# Patient Record
Sex: Female | Born: 1985
Health system: Southern US, Community
[De-identification: ages and names within clinical notes are randomized; demographics above are authoritative.]

## PROBLEM LIST (undated history)

## (undated) DIAGNOSIS — U071 COVID-19: Secondary | ICD-10-CM

## (undated) DIAGNOSIS — D72829 Elevated white blood cell count, unspecified: Secondary | ICD-10-CM

## (undated) DIAGNOSIS — J40 Bronchitis, not specified as acute or chronic: Secondary | ICD-10-CM

## (undated) DIAGNOSIS — F99 Mental disorder, not otherwise specified: Secondary | ICD-10-CM

## (undated) DIAGNOSIS — I10 Essential (primary) hypertension: Secondary | ICD-10-CM

## (undated) DIAGNOSIS — A599 Trichomoniasis, unspecified: Secondary | ICD-10-CM

## (undated) DIAGNOSIS — A549 Gonococcal infection, unspecified: Secondary | ICD-10-CM

## (undated) DIAGNOSIS — J45909 Unspecified asthma, uncomplicated: Secondary | ICD-10-CM

## (undated) DIAGNOSIS — L309 Dermatitis, unspecified: Secondary | ICD-10-CM

## (undated) DIAGNOSIS — R87810 Cervical high risk human papillomavirus (HPV) DNA test positive: Secondary | ICD-10-CM

## (undated) HISTORY — DX: Dermatitis, unspecified: L30.9

## (undated) HISTORY — DX: Mental disorder, not otherwise specified: F99

## (undated) HISTORY — DX: Cervical high risk human papillomavirus (HPV) DNA test positive: R87.810

## (undated) HISTORY — PX: NO PAST SURGERIES: SHX2092

---
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
## 2007-04-14 ENCOUNTER — Ambulatory Visit: Payer: Self-pay | Admitting: Internal Medicine

## 2007-05-28 ENCOUNTER — Emergency Department (HOSPITAL_COMMUNITY): Admission: EM | Admit: 2007-05-28 | Discharge: 2007-05-28 | Payer: Self-pay | Admitting: Emergency Medicine

## 2009-09-29 ENCOUNTER — Emergency Department (HOSPITAL_COMMUNITY): Admission: EM | Admit: 2009-09-29 | Discharge: 2009-09-29 | Payer: Self-pay | Admitting: Emergency Medicine

## 2009-10-31 ENCOUNTER — Ambulatory Visit: Payer: Self-pay | Admitting: Internal Medicine

## 2009-11-30 ENCOUNTER — Encounter (INDEPENDENT_AMBULATORY_CARE_PROVIDER_SITE_OTHER): Payer: Self-pay | Admitting: Family Medicine

## 2009-11-30 ENCOUNTER — Ambulatory Visit: Payer: Self-pay | Admitting: Internal Medicine

## 2009-11-30 LAB — CONVERTED CEMR LAB
ALT: 22 units/L (ref 0–35)
AST: 19 units/L (ref 0–37)
Albumin: 3.9 g/dL (ref 3.5–5.2)
Alkaline Phosphatase: 91 units/L (ref 39–117)
BUN: 8 mg/dL (ref 6–23)
Basophils Absolute: 0.1 10*3/uL (ref 0.0–0.1)
Basophils Relative: 0 % (ref 0–1)
CO2: 22 meq/L (ref 19–32)
Calcium: 9.2 mg/dL (ref 8.4–10.5)
Chloride: 105 meq/L (ref 96–112)
Cholesterol: 129 mg/dL (ref 0–200)
Creatinine, Ser: 0.65 mg/dL (ref 0.40–1.20)
Eosinophils Absolute: 0.5 10*3/uL (ref 0.0–0.7)
Eosinophils Relative: 4 % (ref 0–5)
Glucose, Bld: 86 mg/dL (ref 70–99)
HCT: 42 % (ref 36.0–46.0)
HDL: 47 mg/dL (ref >39)
Hemoglobin: 13.2 g/dL (ref 12.0–15.0)
LDL Cholesterol: 69 mg/dL (ref 0–99)
Lymphocytes Relative: 23 % (ref 12–46)
Lymphs Abs: 3.1 10*3/uL (ref 0.7–4.0)
MCHC: 31.4 g/dL (ref 30.0–36.0)
MCV: 88.2 fL (ref 78.0–100.0)
Monocytes Absolute: 1.3 10*3/uL — ABNORMAL HIGH (ref 0.1–1.0)
Monocytes Relative: 10 % (ref 3–12)
Neutro Abs: 8.5 10*3/uL — ABNORMAL HIGH (ref 1.7–7.7)
Neutrophils Relative %: 63 % (ref 43–77)
Platelets: 350 10*3/uL (ref 150–400)
Potassium: 4.2 meq/L (ref 3.5–5.3)
RBC: 4.76 M/uL (ref 3.87–5.11)
RDW: 15.7 % — ABNORMAL HIGH (ref 11.5–15.5)
Sodium: 138 meq/L (ref 135–145)
Total Bilirubin: 0.3 mg/dL (ref 0.3–1.2)
Total CHOL/HDL Ratio: 2.7
Total Protein: 6.8 g/dL (ref 6.0–8.3)
Triglycerides: 63 mg/dL (ref <150)
VLDL: 13 mg/dL (ref 0–40)
WBC: 13.5 10*3/uL — ABNORMAL HIGH (ref 4.0–10.5)

## 2010-03-20 ENCOUNTER — Emergency Department (HOSPITAL_COMMUNITY): Admission: EM | Admit: 2010-03-20 | Discharge: 2010-03-20 | Payer: Self-pay | Admitting: Emergency Medicine

## 2010-04-22 DIAGNOSIS — D72829 Elevated white blood cell count, unspecified: Secondary | ICD-10-CM

## 2010-04-22 HISTORY — DX: Elevated white blood cell count, unspecified: D72.829

## 2010-07-09 LAB — POCT I-STAT, CHEM 8
BUN: 8 mg/dL (ref 6–23)
Calcium, Ion: 1.15 mmol/L (ref 1.12–1.32)
Chloride: 107 mEq/L (ref 96–112)
Creatinine, Ser: 0.7 mg/dL (ref 0.4–1.2)
Glucose, Bld: 97 mg/dL (ref 70–99)
HCT: 45 % (ref 36.0–46.0)
Hemoglobin: 15.3 g/dL — ABNORMAL HIGH (ref 12.0–15.0)
Potassium: 3.6 mEq/L (ref 3.5–5.1)
Sodium: 140 mEq/L (ref 135–145)
TCO2: 25 mmol/L (ref 0–100)

## 2010-07-09 LAB — URINE MICROSCOPIC-ADD ON

## 2010-07-09 LAB — URINALYSIS, ROUTINE W REFLEX MICROSCOPIC
Bilirubin Urine: NEGATIVE
Glucose, UA: NEGATIVE mg/dL
Hgb urine dipstick: NEGATIVE
Ketones, ur: NEGATIVE mg/dL
Nitrite: NEGATIVE
Protein, ur: NEGATIVE mg/dL
Specific Gravity, Urine: 1.022 (ref 1.005–1.030)
Urobilinogen, UA: 0.2 mg/dL (ref 0.0–1.0)
pH: 6.5 (ref 5.0–8.0)

## 2010-07-09 LAB — DIFFERENTIAL
Basophils Absolute: 0 10*3/uL (ref 0.0–0.1)
Basophils Relative: 0 % (ref 0–1)
Eosinophils Absolute: 0.7 10*3/uL (ref 0.0–0.7)
Eosinophils Relative: 4 % (ref 0–5)
Lymphocytes Relative: 11 % — ABNORMAL LOW (ref 12–46)
Lymphs Abs: 2 10*3/uL (ref 0.7–4.0)
Monocytes Absolute: 1.5 10*3/uL — ABNORMAL HIGH (ref 0.1–1.0)
Monocytes Relative: 9 % (ref 3–12)
Neutro Abs: 13.3 10*3/uL — ABNORMAL HIGH (ref 1.7–7.7)
Neutrophils Relative %: 76 % (ref 43–77)

## 2010-07-09 LAB — POCT PREGNANCY, URINE: Preg Test, Ur: NEGATIVE

## 2010-07-09 LAB — CBC
HCT: 41.8 % (ref 36.0–46.0)
Hemoglobin: 13.9 g/dL (ref 12.0–15.0)
MCHC: 33.3 g/dL (ref 30.0–36.0)
MCV: 85.2 fL (ref 78.0–100.0)
Platelets: 314 10*3/uL (ref 150–400)
RBC: 4.91 MIL/uL (ref 3.87–5.11)
RDW: 13.9 % (ref 11.5–15.5)
WBC: 17.5 10*3/uL — ABNORMAL HIGH (ref 4.0–10.5)

## 2010-08-08 ENCOUNTER — Emergency Department (HOSPITAL_COMMUNITY)
Admission: EM | Admit: 2010-08-08 | Discharge: 2010-08-09 | Disposition: A | Discharge status: Home / Self Care | Payer: Self-pay | Attending: Emergency Medicine | Admitting: Emergency Medicine

## 2010-08-08 DIAGNOSIS — S239XXA Sprain of unspecified parts of thorax, initial encounter: Secondary | ICD-10-CM | POA: Insufficient documentation

## 2010-08-08 DIAGNOSIS — Y998 Other external cause status: Secondary | ICD-10-CM | POA: Insufficient documentation

## 2010-08-08 DIAGNOSIS — Y92009 Unspecified place in unspecified non-institutional (private) residence as the place of occurrence of the external cause: Secondary | ICD-10-CM | POA: Insufficient documentation

## 2010-08-08 DIAGNOSIS — Y93E6 Activity, residential relocation: Secondary | ICD-10-CM | POA: Insufficient documentation

## 2010-08-08 DIAGNOSIS — L259 Unspecified contact dermatitis, unspecified cause: Secondary | ICD-10-CM | POA: Insufficient documentation

## 2010-08-08 DIAGNOSIS — S335XXA Sprain of ligaments of lumbar spine, initial encounter: Secondary | ICD-10-CM | POA: Insufficient documentation

## 2010-08-08 DIAGNOSIS — I10 Essential (primary) hypertension: Secondary | ICD-10-CM | POA: Insufficient documentation

## 2010-08-08 DIAGNOSIS — X503XXA Overexertion from repetitive movements, initial encounter: Secondary | ICD-10-CM | POA: Insufficient documentation

## 2010-09-07 NOTE — H&P (Signed)
NAMERIMSHA, TREMBLEY NO.:  [id number]   MEDICAL RECORD NO.:  [id number]           PATIENT TYPE:   LOCATION:                                 FACILITY:   PHYSICIAN:  Isidor Holts, M.D.       DATE OF BIRTH:   DATE OF ADMISSION:  12/04/2004  DATE OF DISCHARGE:                                HISTORY & PHYSICAL   PRIMARY MEDICAL DOCTOR:  [facility]; unassigned to [place].   CHIEF COMPLAINT:  Chest pain.   HISTORY OF PRESENT ILLNESS:  This is a 25 year old female, with known  history of hypertension and seasonal allergies.  She developed central chest  pain with palpitations at about 8:30 p.m. on December 03, 2004.  No radiation,  no shortness of breath, no dizziness or diaphoresis.  The pain lasted until  about 10:30 p.m.  Denies cough.  Denies fever.  Her aunt brought her to the  emergency department, where symptoms were relieved by supplemental oxygen.   PAST MEDICAL HISTORY:  1.  Eczema.  2.  Hypertension.  3.  Seasonal allergies.  4.  Prior history of bronchial asthma.  5.  Obesity.   MEDICATIONS:  1.  Allegra 180 mg p.o. daily.  2.  Nifedipine SR 90 mg p.o. daily.  3.  HCTZ 25 mg p.o. daily.  4.  Recently completed 10-day course of Augmentin, started on December 01, 2004.   ALLERGIES:  No known drug allergies.   SYSTEMS REVIEW:  As per HPI.   CHIEF COMPLAINT:  The patient was recently treated with a 10-day course of  Augmentin for a sinus infection and a leukocytosis.  She is now completely  asymptomatic.  Denies urinary frequency.  Denies dysuria, diarrhea or fever.   FAMILY/SOCIAL HISTORY:  The patient is a Consulting civil engineer.  She is taking her GED at  present, and lives with her aunt.  She is a smoker of 2 cigarettes per day  since age 32 years.  Denies alcohol use or drug abuse.  She has 1 brother  who is alive and well.  Her mother has a seizure disorder and hypertension.  Her father's health status is not known.   PHYSICAL EXAMINATION:  VITAL  SIGNS:  Temperature 97.4, pulse 94 per minute  and regular, respiratory rate 20, blood pressure 103/63 mmHg.  Pulse  oximetry 100% on room air.  GENERAL:  The patient appears quite comfortable, alert, not short of breath  at rest, not in acute distress.  SKIN:  Has atopic eczema on her skin.  HEENT:  No clinical pallor.  No jaundice.  No conjunctival injection.  Throat appears quite clear.  NECK:  Supple.  JVP not seen.  No palpable lymphadenopathy.  No palpable  goiter.  CHEST:  Clear to auscultation.  No wheezes, no crackles.  HEART:  Heart sounds 1 and 2 were heard.  Normal, regular.  No murmurs.  ABDOMEN:  Obese, soft, and nontender.  There is no palpable organomegaly, no  palpable masses.  Normal bowel sounds.  EXTREMITIES:  Lower extremity examination revealed no pitting edema.  Palpable peripheral pulses.  There is no tenseness or tenderness of both  calves.  CENTRAL NERVOUS SYSTEM:  No focal neurological deficit on gross examination.  MUSCULOSKELETAL:  Quite unremarkable.   INVESTIGATIONS:  CBC revealed WBC of 18.2, hemoglobin 11.7, hematocrit 35.3,  platelets 433.  Electrolytes revealed sodium of 137, potassium 3.8, chloride  107, CO2 of 25, BUN 10, creatinine 0.8, glucose 88.  LFT's are normal.  Troponin I at point of care is less than 0.05.  Urine drug screen is  entirely negative.  Chest x-ray shows no acute cardiopulmonary process.  EKG  shows sinus rhythm, regular, 85 per minute.  Normal axis.  No acute ischemic  changes.   ASSESSMENT AND PLAN:  1.  Atypical chest pain, unclear etiology.  Has resolved after supplemental      oxygen in the emergency department.  For completeness, we will do a      chest CT angiogram to rule out pulmonary embolism.  Also, do a 2-D      echocardiogram.  Meanwhile, will start the patient on proton pump      inhibitor treatment.   1.  Leukocytosis.  Query cause. Possibly secondary to allergies or even      secondary to smoking.  There is  no obvious infective focus.  However,      for completeness, will do urinalysis.   1.  Allergies.  This appears clinically quiescent at present.  Will monitor.   1.  Hypertension, controlled.  Continue pre-admission medications.   Further management will depend on clinical course.      Isidor Holts, M.D.  Electronically Signed     CO/MEDQ  D:  12/04/2004  T:  12/04/2004  Job:  [phone]

## 2010-09-21 ENCOUNTER — Emergency Department (HOSPITAL_COMMUNITY)
Admission: EM | Admit: 2010-09-21 | Discharge: 2010-09-22 | Disposition: A | Discharge status: Home / Self Care | Payer: Self-pay | Attending: Emergency Medicine | Admitting: Emergency Medicine

## 2010-09-21 DIAGNOSIS — R109 Unspecified abdominal pain: Secondary | ICD-10-CM | POA: Insufficient documentation

## 2010-09-21 DIAGNOSIS — M545 Low back pain, unspecified: Secondary | ICD-10-CM | POA: Insufficient documentation

## 2010-09-21 DIAGNOSIS — I10 Essential (primary) hypertension: Secondary | ICD-10-CM | POA: Insufficient documentation

## 2010-09-21 LAB — URINALYSIS, ROUTINE W REFLEX MICROSCOPIC
Bilirubin Urine: NEGATIVE
Glucose, UA: NEGATIVE mg/dL
Hgb urine dipstick: NEGATIVE
Ketones, ur: NEGATIVE mg/dL
Nitrite: NEGATIVE
Protein, ur: NEGATIVE mg/dL
Specific Gravity, Urine: >1.03 — ABNORMAL HIGH (ref 1.005–1.030)
Urobilinogen, UA: 0.2 mg/dL (ref 0.0–1.0)
pH: 5.5 (ref 5.0–8.0)

## 2010-09-22 LAB — PREGNANCY, URINE: Preg Test, Ur: NEGATIVE

## 2010-11-04 ENCOUNTER — Emergency Department (HOSPITAL_COMMUNITY)
Admission: EM | Admit: 2010-11-04 | Discharge: 2010-11-04 | Discharge status: Against Medical Advice | Payer: Self-pay | Attending: Emergency Medicine | Admitting: Emergency Medicine

## 2010-11-04 DIAGNOSIS — L02219 Cutaneous abscess of trunk, unspecified: Secondary | ICD-10-CM | POA: Insufficient documentation

## 2010-11-04 DIAGNOSIS — L03319 Cellulitis of trunk, unspecified: Secondary | ICD-10-CM | POA: Insufficient documentation

## 2010-11-04 HISTORY — DX: Essential (primary) hypertension: I10

## 2010-11-04 NOTE — ED Notes (Signed)
Friend busted a whitehead on pt's perineum 2 days ago, tonight pt is having pain to area, friend noted white discharge.

## 2011-01-20 ENCOUNTER — Emergency Department (HOSPITAL_COMMUNITY)
Admission: EM | Admit: 2011-01-20 | Discharge: 2011-01-20 | Disposition: A | Discharge status: Home / Self Care | Payer: Self-pay | Attending: Emergency Medicine | Admitting: Emergency Medicine

## 2011-01-20 DIAGNOSIS — R21 Rash and other nonspecific skin eruption: Secondary | ICD-10-CM | POA: Insufficient documentation

## 2011-01-20 DIAGNOSIS — I10 Essential (primary) hypertension: Secondary | ICD-10-CM | POA: Insufficient documentation

## 2011-01-20 DIAGNOSIS — L259 Unspecified contact dermatitis, unspecified cause: Secondary | ICD-10-CM | POA: Insufficient documentation

## 2011-01-20 DIAGNOSIS — L298 Other pruritus: Secondary | ICD-10-CM | POA: Insufficient documentation

## 2011-01-20 DIAGNOSIS — L2989 Other pruritus: Secondary | ICD-10-CM | POA: Insufficient documentation

## 2011-08-27 ENCOUNTER — Emergency Department (HOSPITAL_COMMUNITY)
Admission: EM | Admit: 2011-08-27 | Discharge: 2011-08-27 | Disposition: A | Discharge status: Home / Self Care | Payer: Self-pay | Attending: Emergency Medicine | Admitting: Emergency Medicine

## 2011-08-27 ENCOUNTER — Encounter (HOSPITAL_COMMUNITY): Payer: Self-pay | Admitting: *Deleted

## 2011-08-27 DIAGNOSIS — R109 Unspecified abdominal pain: Secondary | ICD-10-CM | POA: Insufficient documentation

## 2011-08-27 DIAGNOSIS — F17200 Nicotine dependence, unspecified, uncomplicated: Secondary | ICD-10-CM | POA: Insufficient documentation

## 2011-08-27 DIAGNOSIS — A5901 Trichomonal vulvovaginitis: Secondary | ICD-10-CM | POA: Insufficient documentation

## 2011-08-27 DIAGNOSIS — I10 Essential (primary) hypertension: Secondary | ICD-10-CM | POA: Insufficient documentation

## 2011-08-27 LAB — PREGNANCY, URINE: Preg Test, Ur: NEGATIVE

## 2011-08-27 LAB — URINALYSIS, ROUTINE W REFLEX MICROSCOPIC
Bilirubin Urine: NEGATIVE
Glucose, UA: NEGATIVE mg/dL
Hgb urine dipstick: NEGATIVE
Ketones, ur: NEGATIVE mg/dL
Nitrite: NEGATIVE
Protein, ur: NEGATIVE mg/dL
Specific Gravity, Urine: 1.025 (ref 1.005–1.030)
Urobilinogen, UA: 1 mg/dL (ref 0.0–1.0)
pH: 7 (ref 5.0–8.0)

## 2011-08-27 LAB — URINE MICROSCOPIC-ADD ON

## 2011-08-27 LAB — WET PREP, GENITAL: Yeast Wet Prep HPF POC: NONE SEEN

## 2011-08-27 MED ORDER — METRONIDAZOLE 500 MG PO TABS: 500.0000 mg | ORAL_TABLET | Freq: Two times a day (BID) | ORAL | Status: AC

## 2011-08-27 NOTE — ED Notes (Signed)
The pt has had some vaginal discharge for 2 days.  lmp 4-27

## 2011-08-27 NOTE — ED Notes (Signed)
Reports 5 days of "greenish" vaginal d/c which resolved then subsequently developed thick white vaginal d/c; states is sexually active - has 2 sexual partners and does not use condoms; also does not use any form of birth control; denies urinary symptoms

## 2011-08-27 NOTE — Discharge Instructions (Signed)
Trichomoniasis Trichomoniasis is an infection, caused by the Trichomonas organism, that affects both women and men. In women, the outer female genitalia and the vagina are affected. In men, the penis is mainly affected, but the prostate and other reproductive organs can also be involved. Trichomoniasis is a sexually transmitted disease (STD) and is most often passed to another person through sexual contact. The majority of people who get trichomoniasis do so from a sexual encounter and are also at risk for other STDs. CAUSES   Sexual intercourse with an infected partner.   It can be present in swimming pools or hot tubs.  SYMPTOMS   Abnormal gray-green frothy vaginal discharge in women.   Vaginal itching and irritation in women.   Itching and irritation of the area outside the vagina in women.   Penile discharge with or without pain in males.   Inflammation of the urethra (urethritis), causing painful urination.   Bleeding after sexual intercourse.  RELATED COMPLICATIONS  Pelvic inflammatory disease.   Infection of the uterus (endometritis).   Infertility.   Tubal (ectopic) pregnancy.   It can be associated with other STDs, including gonorrhea and chlamydia, hepatitis B, and HIV.  COMPLICATIONS DURING PREGNANCY  Early (premature) delivery.   Premature rupture of the membranes (PROM).   Low birth weight.  DIAGNOSIS   Visualization of Trichomonas under the microscope from the vagina discharge.   Ph of the vagina greater than 4.5, tested with a test tape.   Trich Rapid Test.   Culture of the organism, but this is not usually needed.   It may be found on a Pap test.   Having a "strawberry cervix,"which means the cervix looks very red like a strawberry.  TREATMENT   You may be given medication to fight the infection. Inform your caregiver if you could be or are pregnant. Some medications used to treat the infection should not be taken during pregnancy.    Over-the-counter medications or creams to decrease itching or irritation may be recommended.   Your sexual partner will need to be treated if infected.  HOME CARE INSTRUCTIONS   Take all medication prescribed by your caregiver.   Take over-the-counter medication for itching or irritation as directed by your caregiver.   Do not have sexual intercourse while you have the infection.   Do not douche or wear tampons.   Discuss your infection with your partner, as your partner may have acquired the infection from you. Or, your partner may have been the person who transmitted the infection to you.   Have your sex partner examined and treated if necessary.   Practice safe, informed, and protected sex.   See your caregiver for other STD testing.  SEEK MEDICAL CARE IF:   You still have symptoms after you finish the medication.   You have an oral temperature above 102 F (38.9 C).   You develop belly (abdominal) pain.   You have pain when you urinate.   You have bleeding after sexual intercourse.   You develop a rash.   The medication makes you sick or makes you throw up (vomit).  Document Released: 10/02/2000 Document Revised: 03/28/2011 Document Reviewed: 10/28/2008 [facility] Patient Information 2012 [place], [place].General Instructions for Vaginal Infections Vaginitis is a term to describe many common vaginal infections. These infections may be due to an imbalance of normal germs (bacteria) that exist in the vagina. Many others are caused by sexually transmitted diseases (STD's). If any medication was prescribed to treat your specific infection, it  is very important that you take the medication as directed. Your caregiver may want to examine and treat your sex partner. CAUSES  The vagina normally contains organisms (bacteria and yeast) in a balance. Certain factors can disturb this balance and cause an infection, such as:  Sexual intercourse.   Nursing.   Pregnancy.    Menopause.   Hormone changes in the body.   Antibiotics.   Infection elsewhere in your body.   Birth control pills or patches.   Douches.   Spermicides.   Medical illnesses (diabetes).  SYMPTOMS  Different types of vaginal infections cause symptoms such as:  Itching.   Pain or burning.   Bad odor.   Pain or bleeding with sexual intercourse.   Redness of the vulva.   Abnormal discharge (yellow, green, heavy white and thick).   Fever.   A sore on the vulva or vagina.   Urinary symptoms (painful or bloody urine).   Pelvic and/or abdominal pain.   Rectal bleeding, discharge or pain.  DIAGNOSIS   Your caregiver will base the diagnosis upon the symptoms that you report.   A complete history of your sex life may be taken   You may have a pelvic exam.   A sample of your vaginal fluid and/or discharge will be examined under the microscope.   Cultures will help complete the exact diagnosis.  TREATMENT  Treatment depends on the cause of your vaginitis. Your treatment may include a medicine that kills germs (antibiotic). The antibiotic may be a shot, a pill, and/or vaginal suppository or cream. It is not uncommon for more than one type of infection to be present. If more than one infection is present, two or more medications may be required. Reoccurrence of vaginal infections may be treated with vaginal suppositories or vaginal cream 2 times a week, or as directed. If your caregiver finds that an STD exists, treatment of your sexual partner(s) is important. This is especially important for those infected with chlamydia, gonorrhea, trichomoniasis, bacterial vaginosis, syphilis and HIV infections. Treating sexual partners will prevent you from being re-infected and help stop the spread of STD infection to others. Although it is best to see a specialist for STD/HIV testing and counseling, this is not always possible. Some states/provinces permit something called "expedited  partner therapy." This kind of program permits you to deliver prescription(s) to a partner without the partner having to seek a formal medical exam.  HOME CARE INSTRUCTIONS   Take all prescribed medication.   If applicable, speak to your partner about recommended treatment.   Do not have sexual intercourse for one week, or as directed by your caregiver.   Practice safe sex.   Use condoms.   Have only one sex partner.   Make sure your sex partner does not have any other sex partners.   Avoid tight pants and panty hose.   Wear cotton underwear.   Do not douche.   Avoid tampons, especially scented ones.   Take warm sitz baths.   Avoid vaginal sprays and perfumed soaps or bath oils.   Apply medicated cream (steroid cream) for itching or irritation with the permission of your caregiver.  SEEK MEDICAL CARE IF:   You have any kind of abnormal vaginal discharge.   Your sex partner has a genital infection.   You have pain or bleeding with sexual intercourse.   You have itching, pain, irritation or bleeding of the vulva.  SEEK IMMEDIATE MEDICAL CARE IF:   You have an  oral temperature above 102 F (38.9 C), not controlled by medicine.   You have belly (abdominal) pain.   Symptoms do not improve within 3 days or as directed.   You develop painful or bloody urine.   You develop rectal pain, bleeding or discharge.  Document Released: 01/16/2005 Document Revised: 03/28/2011 Document Reviewed: 09/01/2008 [facility] Patient Information 2012 [place], [place].

## 2011-08-27 NOTE — ED Provider Notes (Signed)
History  This chart was scribed for Norma Booze, MD by Norma Nelson. This patient was seen in room STRE7/STRE7 and the patient's care was started at 5:20PM.  CSN: [id number]  Arrival date & time 08/27/11  1500   First MD Initiated Contact with Patient 08/27/11 1720      Chief Complaint  Patient presents with  . vaginal discharge     The history is provided by the patient. No language interpreter was used.    Norma Nelson is a 26 y.o. female who presents to the Emergency Department complaining of white vaginal discharge with associated suprapubic abdominal pain that started yesterday. The abdominal pain is described as achy and is worse with touch. She reports a prior episode of vaginal disharge described as a yellowish/greenish color about 5 days ago. She has not taken any medications to improve her symptoms. She is sexually active and states that she uses a condom every time. She denies fever, nausea and emesis as associated symptoms. She has a h/o HTN.   No PCP.   Past Medical History  Diagnosis Date  . Hypertension     History reviewed. No pertinent past surgical history.  No family history on file.  History  Substance Use Topics  . Smoking status: Current Everyday Smoker- one pack a day  . Smokeless tobacco: Not on file  . Alcohol Use: Yes     occ     Review of Systems  Constitutional: Negative for fever and chills.  Respiratory: Negative for cough and shortness of breath.   Gastrointestinal: Positive for abdominal pain. Negative for nausea and vomiting.  Genitourinary: Positive for vaginal discharge. Negative for dysuria.  Neurological: Negative for weakness and headaches.    Allergies  Review of patient's allergies indicates no known allergies.  Home Medications  No current outpatient prescriptions on file.  Triage Vitals: BP 160/85  Pulse 79  Temp(Src) 98.2 F (36.8 C) (Oral)  Resp 18  SpO2 98%  LMP 08/17/2011  Physical Exam  Nursing note and  vitals reviewed. Constitutional: She is oriented to person, place, and time. She appears well-developed and well-nourished. No distress.  HENT:  Head: Normocephalic and atraumatic.  Eyes: Conjunctivae and EOM are normal.  Neck: Neck supple. No tracheal deviation present.  Cardiovascular: Normal rate.   Pulmonary/Chest: Effort normal. No respiratory distress.  Abdominal: Soft. There is tenderness.       Mild tenderness to the suprapubic area  Genitourinary:       Normal external female genitalia. Moderate, watery, white vaginal discharge present. Clear mucus coming from the cervix. Cervix is closed and otherwise normal. Fundus is normal size and position. There no adnexal masses or tenderness. There is no cervical motion tenderness.  Musculoskeletal: Normal range of motion. She exhibits no edema.  Neurological: She is alert and oriented to person, place, and time.  Skin: Skin is warm and dry.  Psychiatric: She has a normal mood and affect. Her behavior is normal.    ED Course  Procedures (including critical care time)  DIAGNOSTIC STUDIES: Oxygen Saturation is 98% on room air, normal by my interpretation.    COORDINATION OF CARE: 5:25PM-Discussed need for pelvic exam and vaginal discharge sample with pt and pt agreed.   Labs Reviewed  URINALYSIS, ROUTINE W REFLEX MICROSCOPIC - Abnormal; Notable for the following:    APPearance HAZY (*)    Leukocytes, UA LARGE (*)    All other components within normal limits  URINE MICROSCOPIC-ADD ON - Abnormal; Notable for the  following:    Squamous Epithelial / LPF FEW (*)    All other components within normal limits  PREGNANCY, URINE   No results found. Results for orders placed during the hospital encounter of 08/27/11  URINALYSIS, ROUTINE W REFLEX MICROSCOPIC      Component Value Range   Color, Urine YELLOW  YELLOW    APPearance HAZY (*) CLEAR    Specific Gravity, Urine 1.025  1.005 - 1.030    pH 7.0  5.0 - 8.0    Glucose, UA NEGATIVE   NEGATIVE (mg/dL)   Hgb urine dipstick NEGATIVE  NEGATIVE    Bilirubin Urine NEGATIVE  NEGATIVE    Ketones, ur NEGATIVE  NEGATIVE (mg/dL)   Protein, ur NEGATIVE  NEGATIVE (mg/dL)   Urobilinogen, UA 1.0  0.0 - 1.0 (mg/dL)   Nitrite NEGATIVE  NEGATIVE    Leukocytes, UA LARGE (*) NEGATIVE   PREGNANCY, URINE      Component Value Range   Preg Test, Ur NEGATIVE  NEGATIVE   URINE MICROSCOPIC-ADD ON      Component Value Range   Squamous Epithelial / LPF FEW (*) RARE    WBC, UA 11-20  <3 (WBC/hpf)   RBC / HPF 0-2  <3 (RBC/hpf)   Bacteria, UA RARE  RARE    Urine-Other TRICHOMONAS PRESENT    WET PREP, GENITAL      Component Value Range   Yeast Wet Prep HPF POC NONE SEEN  NONE SEEN    Trich, Wet Prep MODERATE (*) NONE SEEN    Clue Cells Wet Prep HPF POC FEW (*) NONE SEEN    WBC, Wet Prep HPF POC TOO NUMEROUS TO COUNT (*) NONE SEEN    No results found.    Trichamonasis    MDM  Patient care taken over from Norma Nelson, please see his note for HPI, ROS and PE - return of wet prep shows clue cells and trich, will treat for this and discharge home.    Norma Nelson, [place] 08/27/11 2051

## 2011-08-28 LAB — RPR: RPR Ser Ql: NONREACTIVE

## 2011-08-28 LAB — HIV ANTIBODY (ROUTINE TESTING W REFLEX): HIV: NONREACTIVE

## 2011-08-29 LAB — GC/CHLAMYDIA PROBE AMP, GENITAL
Chlamydia, DNA Probe: NEGATIVE
GC Probe Amp, Genital: NEGATIVE

## 2011-08-29 NOTE — ED Provider Notes (Signed)
Medical screening examination/treatment/procedure(s) were conducted as a shared visit with non-physician practitioner(s) and myself.  I personally evaluated the patient during the encounter   Dione Booze, MD 08/29/11 [phone]

## 2011-09-24 ENCOUNTER — Emergency Department (HOSPITAL_COMMUNITY)
Admission: EM | Admit: 2011-09-24 | Discharge: 2011-09-24 | Disposition: A | Discharge status: Home / Self Care | Payer: Self-pay | Attending: Emergency Medicine | Admitting: Emergency Medicine

## 2011-09-24 ENCOUNTER — Encounter (HOSPITAL_COMMUNITY): Payer: Self-pay | Admitting: *Deleted

## 2011-09-24 DIAGNOSIS — F17200 Nicotine dependence, unspecified, uncomplicated: Secondary | ICD-10-CM | POA: Insufficient documentation

## 2011-09-24 DIAGNOSIS — H6690 Otitis media, unspecified, unspecified ear: Secondary | ICD-10-CM | POA: Insufficient documentation

## 2011-09-24 DIAGNOSIS — H6691 Otitis media, unspecified, right ear: Secondary | ICD-10-CM

## 2011-09-24 DIAGNOSIS — I10 Essential (primary) hypertension: Secondary | ICD-10-CM | POA: Insufficient documentation

## 2011-09-24 MED ORDER — IBUPROFEN 800 MG PO TABS
800.0000 mg | ORAL_TABLET | Freq: Once | ORAL | Status: AC
  Administered 2011-09-24: 800 mg via ORAL
  Filled 2011-09-24: qty 1

## 2011-09-24 MED ORDER — AMOXICILLIN 500 MG PO CAPS: 500.0000 mg | ORAL_CAPSULE | Freq: Three times a day (TID) | ORAL | Status: AC

## 2011-09-24 MED ORDER — AMOXICILLIN 250 MG PO CAPS
500.0000 mg | ORAL_CAPSULE | Freq: Once | ORAL | Status: AC
  Administered 2011-09-24: 500 mg via ORAL
  Filled 2011-09-24: qty 2

## 2011-09-24 MED ORDER — ANTIPYRINE-BENZOCAINE 5.4-1.4 % OT SOLN
3.0000 [drp] | Freq: Once | OTIC | Status: AC
  Administered 2011-09-24: 3 [drp] via OTIC
  Filled 2011-09-24: qty 10

## 2011-09-24 NOTE — ED Provider Notes (Signed)
History     CSN: [id number]  Arrival date & time 09/24/11  1101   None     Chief Complaint  Patient presents with  . Otalgia    (Consider location/radiation/quality/duration/timing/severity/associated sxs/prior treatment) HPI Comments: Has put peroxide and urine in ear without improvement.  Patient is a 26 y.o. female presenting with ear pain. The history is provided by the patient. No language interpreter was used.  Otalgia This is a new problem. The current episode started yesterday. There is pain in the right ear. The problem occurs constantly. The problem has not changed since onset.There has been no fever. The pain is moderate. Associated symptoms include ear discharge and hearing loss. Pertinent negatives include no sore throat. Her past medical history does not include chronic ear infection.    Past Medical History  Diagnosis Date  . Hypertension     History reviewed. No pertinent past surgical history.  No family history on file.  History  Substance Use Topics  . Smoking status: Current Everyday Smoker    Types: Cigarettes  . Smokeless tobacco: Not on file  . Alcohol Use: Yes     occ    OB History    Grav Para Term Preterm Abortions TAB SAB Ect Mult Living                  Review of Systems  Constitutional: Negative for fever and chills.  HENT: Positive for hearing loss, ear pain and ear discharge. Negative for sore throat.     Allergies  Review of patient's allergies indicates no known allergies.  Home Medications   Current Outpatient Rx  Name Route Sig Dispense Refill  . IBUPROFEN 200 MG PO TABS Oral Take 400 mg by mouth every 6 (six) hours as needed. For pain    . AMOXICILLIN 500 MG PO CAPS Oral Take 1 capsule (500 mg total) by mouth 3 (three) times daily. 30 capsule 0    BP 159/97  Pulse 85  Temp(Src) 99.3 F (37.4 C) (Oral)  Resp 20  Ht 5\' 11"  (1.803 m)  Wt 293 lb (132.904 kg)  BMI 40.87 kg/m2  SpO2 100%  LMP 09/20/2011  Physical  Exam  Nursing note and vitals reviewed. Constitutional: She is oriented to person, place, and time. She appears well-developed and well-nourished. No distress.  HENT:  Head: Normocephalic and atraumatic.  Right Ear: External ear normal. No drainage or swelling.  Left Ear: Hearing, tympanic membrane, external ear and ear canal normal.  Nose: Nose normal.  Mouth/Throat: Oropharynx is clear and moist. No oropharyngeal exudate.       Unable to visualize R TM  Eyes: EOM are normal.  Neck: Normal range of motion.  Cardiovascular: Normal rate, regular rhythm and normal heart sounds.   Pulmonary/Chest: Effort normal and breath sounds normal.  Abdominal: Soft. She exhibits no distension. There is no tenderness.  Musculoskeletal: Normal range of motion.  Neurological: She is alert and oriented to person, place, and time.  Skin: Skin is warm and dry.  Psychiatric: She has a normal mood and affect. Judgment normal.    ED Course  Procedures (including critical care time)  Labs Reviewed - No data to display No results found.   1. Right otitis media       MDM  Probable R AOM but unable to visualize R TM. rx-amoxicillin 500 mg TID, 30 OTC ibuprofen 800 mg TID with food. \auragan otic prn pain.        Tressie Ragin Paul Half,  [place] 09/24/11 1213

## 2011-09-24 NOTE — ED Notes (Signed)
C/o bil ear pain since last night.  Reports unable to hear out of right ear.  Denies fever.

## 2011-09-24 NOTE — Discharge Instructions (Signed)
Otitis Media, Adult A middle ear infection is an infection in the space behind the eardrum. The medical name for this is "otitis media." It may happen after a common cold. It is caused by a germ that starts growing in that space. You may feel swollen glands in your neck on the side of the ear infection. HOME CARE INSTRUCTIONS   Take your medicine as directed until it is gone, even if you feel better after the first few days.   Only take over-the-counter or prescription medicines for pain, discomfort, or fever as directed by your caregiver.   Occasional use of a nasal decongestant a couple times per day may help with discomfort and help the eustachian tube to drain better.  Follow up with your caregiver in 10 to 14 days or as directed, to be certain that the infection has cleared. Not keeping the appointment could result in a chronic or permanent injury, pain, hearing loss and disability. If there is any problem keeping the appointment, you must call back to this facility for assistance. SEEK IMMEDIATE MEDICAL CARE IF:   You are not getting better in 2 to 3 days.   You have pain that is not controlled with medication.   You feel worse instead of better.   You cannot use the medication as directed.   You develop swelling, redness or pain around the ear or stiffness in your neck.  MAKE SURE YOU:   Understand these instructions.   Will watch your condition.   Will get help right away if you are not doing well or get worse.  Document Released: 01/12/2004 Document Revised: 03/28/2011 Document Reviewed: 11/13/2007 [facility] Patient Information 2012 [place], [place].   Take the antibiotic as directed.  Take ibuprofen 800 mg every 8 hrs with food.  Insert 2-3 drops of auralgain in ear as needed for pain.  Return as needed.

## 2011-09-24 NOTE — ED Provider Notes (Signed)
Medical screening examination/treatment/procedure(s) were performed by non-physician practitioner and as supervising physician I was immediately available for consultation/collaboration.   Laray Anger, DO 09/24/11 Babette Relic

## 2011-11-05 ENCOUNTER — Emergency Department (HOSPITAL_COMMUNITY)
Admission: EM | Admit: 2011-11-05 | Discharge: 2011-11-05 | Disposition: A | Discharge status: Home / Self Care | Payer: No Typology Code available for payment source | Attending: Emergency Medicine | Admitting: Emergency Medicine

## 2011-11-05 ENCOUNTER — Encounter (HOSPITAL_COMMUNITY): Payer: Self-pay | Admitting: Emergency Medicine

## 2011-11-05 ENCOUNTER — Emergency Department (HOSPITAL_COMMUNITY): Payer: Self-pay

## 2011-11-05 DIAGNOSIS — S39012A Strain of muscle, fascia and tendon of lower back, initial encounter: Secondary | ICD-10-CM

## 2011-11-05 DIAGNOSIS — M545 Low back pain, unspecified: Secondary | ICD-10-CM | POA: Insufficient documentation

## 2011-11-05 DIAGNOSIS — S139XXA Sprain of joints and ligaments of unspecified parts of neck, initial encounter: Secondary | ICD-10-CM | POA: Insufficient documentation

## 2011-11-05 DIAGNOSIS — S335XXA Sprain of ligaments of lumbar spine, initial encounter: Secondary | ICD-10-CM | POA: Insufficient documentation

## 2011-11-05 DIAGNOSIS — I10 Essential (primary) hypertension: Secondary | ICD-10-CM | POA: Insufficient documentation

## 2011-11-05 DIAGNOSIS — S161XXA Strain of muscle, fascia and tendon at neck level, initial encounter: Secondary | ICD-10-CM

## 2011-11-05 DIAGNOSIS — M542 Cervicalgia: Secondary | ICD-10-CM | POA: Insufficient documentation

## 2011-11-05 DIAGNOSIS — F17200 Nicotine dependence, unspecified, uncomplicated: Secondary | ICD-10-CM | POA: Insufficient documentation

## 2011-11-05 DIAGNOSIS — Z79899 Other long term (current) drug therapy: Secondary | ICD-10-CM | POA: Insufficient documentation

## 2011-11-05 LAB — POCT PREGNANCY, URINE: Preg Test, Ur: NEGATIVE

## 2011-11-05 MED ORDER — IBUPROFEN 600 MG PO TABS: 600.0000 mg | ORAL_TABLET | Freq: Three times a day (TID) | ORAL | Status: DC | PRN

## 2011-11-05 MED ORDER — TRAMADOL HCL 50 MG PO TABS: 50.0000 mg | ORAL_TABLET | Freq: Four times a day (QID) | ORAL | Status: AC | PRN

## 2011-11-05 MED ORDER — HYDROCODONE-ACETAMINOPHEN 5-325 MG PO TABS: 1.0000 | ORAL_TABLET | Freq: Once | ORAL | Status: DC

## 2011-11-05 MED ORDER — IBUPROFEN 800 MG PO TABS
800.0000 mg | ORAL_TABLET | Freq: Once | ORAL | Status: AC
  Administered 2011-11-05: 800 mg via ORAL
  Filled 2011-11-05: qty 1

## 2011-11-05 MED ORDER — CYCLOBENZAPRINE HCL 5 MG PO TABS: 5.0000 mg | ORAL_TABLET | Freq: Three times a day (TID) | ORAL | Status: AC | PRN

## 2011-11-05 MED ORDER — CYCLOBENZAPRINE HCL 5 MG PO TABS: 5.0000 mg | ORAL_TABLET | Freq: Three times a day (TID) | ORAL | Status: DC | PRN

## 2011-11-05 NOTE — ED Notes (Signed)
Patient with no complaints at this time. Respirations even and unlabored. Skin warm/dry. Discharge instructions reviewed with patient at this time. Patient given opportunity to voice concerns/ask questions. Patient discharged at this time and left Emergency Department with steady gait.   

## 2011-11-05 NOTE — Discharge Instructions (Signed)
Cervical Sprain A cervical sprain is an injury in the neck in which the ligaments are stretched or torn. The ligaments are the tissues that hold the bones of the neck (vertebrae) in place.Cervical sprains can range from very mild to very severe. Most cervical sprains get better in 1 to 3 weeks, but it depends on the cause and extent of the injury. Severe cervical sprains can cause the neck vertebrae to be unstable. This can lead to damage of the spinal cord and can result in serious nervous system problems. Your caregiver will determine whether your cervical sprain is mild or severe. CAUSES  Severe cervical sprains may be caused by:  Contact sport injuries (football, rugby, wrestling, hockey, auto racing, gymnastics, diving, martial arts, boxing).   Motor vehicle collisions.   Whiplash injuries. This means the neck is forcefully whipped backward and forward.   Falls.  Mild cervical sprains may be caused by:   Awkward positions, such as cradling a telephone between your ear and shoulder.   Sitting in a chair that does not offer proper support.   Working at a poorly designed computer station.   Activities that require looking up or down for long periods of time.  SYMPTOMS   Pain, soreness, stiffness, or a burning sensation in the front, back, or sides of the neck. This discomfort may develop immediately after injury or it may develop slowly and not begin for 24 hours or more after an injury.   Pain or tenderness directly in the middle of the back of the neck.   Shoulder or upper back pain.   Limited ability to move the neck.   Headache.   Dizziness.   Weakness, numbness, or tingling in the hands or arms.   Muscle spasms.   Difficulty swallowing or chewing.   Tenderness and swelling of the neck.  DIAGNOSIS  Most of the time, your caregiver can diagnose this problem by taking your history and doing a physical exam. Your caregiver will ask about any known problems, such as  arthritis in the neck or a previous neck injury. X-rays may be taken to find out if there are any other problems, such as problems with the bones of the neck. However, an X-ray often does not reveal the full extent of a cervical sprain. Other tests such as a computed tomography (CT) scan or magnetic resonance imaging (MRI) may be needed. TREATMENT  Treatment depends on the severity of the cervical sprain. Mild sprains can be treated with rest, keeping the neck in place (immobilization), and pain medicines. Severe cervical sprains need immediate immobilization and an appointment with an orthopedist or neurosurgeon. Several treatment options are available to help with pain, muscle spasms, and other symptoms. Your caregiver may prescribe:  Medicines, such as pain relievers, numbing medicines, or muscle relaxants.   Physical therapy. This can include stretching exercises, strengthening exercises, and posture training. Exercises and improved posture can help stabilize the neck, strengthen muscles, and help stop symptoms from returning.   A neck collar to be worn for short periods of time. Often, these collars are worn for comfort. However, certain collars may be worn to protect the neck and prevent further worsening of a serious cervical sprain.  HOME CARE INSTRUCTIONS   Put ice on the injured area.   Put ice in a plastic bag.   Place a towel between your skin and the bag.   Leave the ice on for 15 to 20 minutes, 3 to 4 times a day.     Only take over-the-counter or prescription medicines for pain, discomfort, or fever as directed by your caregiver.   Keep all follow-up appointments as directed by your caregiver.   Keep all physical therapy appointments as directed by your caregiver.   If a neck collar is prescribed, wear it as directed by your caregiver.   Do not drive while wearing a neck collar.   Make any needed adjustments to your work station to promote good posture.   Avoid positions  and activities that make your symptoms worse.   Warm up and stretch before being active to help prevent problems.  SEEK MEDICAL CARE IF:   Your pain is not controlled with medicine.   You are unable to decrease your pain medicine over time as planned.   Your activity level is not improving as expected.  SEEK IMMEDIATE MEDICAL CARE IF:   You develop any bleeding, stomach upset, or signs of an allergic reaction to your medicine.   Your symptoms get worse.   You develop new, unexplained symptoms.   You have numbness, tingling, weakness, or paralysis in any part of your body.  MAKE SURE YOU:   Understand these instructions.   Will watch your condition.   Will get help right away if you are not doing well or get worse.  Document Released: 02/03/2007 Document Revised: 03/28/2011 Document Reviewed: 01/09/2011 Exit[employer] Patient Information 2012 [employer].  RESOURCE GUIDE  Chronic Pain Problems: Contact [facility]  [phone] Patients need to be referred by their primary care doctor.  Insufficient Money for Medicine: Contact United Way:  call "211" or [facility] 271-5999.  No Primary Care Doctor: - Call [facility]  [phone] - can help you locate a primary care doctor that  accepts your insurance, provides certain services, etc. - Physician Referral Service- 1-800-533-3463  Agencies that provide inexpensive medical care: - [facility]  [phone] - [facility]  [phone] - [facility]  [phone] - [facility]  [phone] - [facility]  [phone] - [facility]  [phone]  Medicaid-accepting [place] Providers: - [facility]- [address]  [phone], Mon-Fri 9am-7pm, Sat 9am-1pm - [facility]- [address], [address]  [phone] - [facility]- [address], [address]  [phone] - [facility]- [address] [place] [address]  [phone] - Veita Bland- [address], [phone]  Only accepts [place] Access Medicaid patients after they have their name  applied to their card  Self Pay (no insurance) in [place]: - Sickle Cell Patients: Dr Eric Dean, [facility]  [address], [phone] - [facility]- [address]  [phone]       -     [facility]- [address]       -     [facility]- see information above (Speak to Pam H if you do not have insurance)       -  [facility]- [address], [phone]       -  [facility] [place]- [address],  [phone]       -  [facility]- [address] [place] [address], [phone]       -  Dr Osei-Bonsu-  [address], [place], [phone]       -    [facility]- [address], [phone]       -  [facility]- [address] [place] [address], [phone], also [address], [phone]       -    [facility]- [address], [phone], 1st & 3rd Saturday   every month, 10am-1pm  1) Find a Doctor and Pay Out of Pocket Although you won't have to find out who is covered by your insurance plan, it is a good idea to ask around and get recommendations. You will then need to call the office and see if the doctor you have chosen will accept you as a new patient and what types of options they offer for patients who are self-pay. Some doctors offer discounts or will set up payment plans for their patients who do not have insurance, but you will need to ask so you aren't surprised when you get to your appointment.  2) Contact Your Local Health Department Not all health departments have doctors that can see patients for sick visits, but many do, so it is worth a call to see if yours does. If you don't know where your local health department is, you can check in  your phone book. The CDC also has a tool to help you locate your state's health department, and many state websites also have listings of all of their local health departments.  3) Find a Walk-in Clinic If your illness is not likely to be very severe or complicated, you may want to try a walk in clinic. These are popping up all over the country in pharmacies, drugstores, and shopping centers. They're usually staffed by nurse practitioners or physician assistants that have been trained to treat common illnesses and complaints. They're usually fairly quick and inexpensive. However, if you have serious medical issues or chronic medical problems, these are probably not your best option  STD Testing - [place] [facility] [place], [facility], [address], [place], phone [phone] or 1-877-539-9860.  Monday - Friday, call for an appointment. - [place] [facility] [place], [facility], [address], [place], phone [phone] or 1-877-539-9860.  Monday - Friday, call for an appointment.  Abuse/Neglect: - [place] Child Abuse Hotline (336) 641-3795 - [place] Child Abuse Hotline 800-378-5315 (After Hours)  Emergency Shelter:  [facility] (336) 271-5985  Maternity Homes: - [facility] (336) 275-9566 - [facility] (704) 372-4663  MRSA Hotline #:   832-7006  [place] Resources  [facility] [place]  [employer] [place] [facility] [address]                 [address]         [address]  [place]                                               [place]                              [place] Phone:  [phone]                                    Phone:  [phone]                   Phone:  [phone]  [place] [facility], [phone] - [place] Services - [facility]- 1-888-581-9988       -     [facility] in [place], [address],                                  336-349-4454, Insurance  [place] Child Abuse Hotline (336) 342-1394 or (336) 342-3537 (After Hours)   Behavioral Health Services  Substance Abuse Resources: - [facility]  336-882-2125 - [facility] 336-784-9470 - [facility] 336-285-9073 - [facility] 336-845-3988 - Residential & Outpatient Substance Abuse Program  800-659-3381  Psychological Services: - [facility]  [phone] - [facility]  [phone] - [facility], [address], [place], ACCESS LINE: 1-800-853-5163 or 336-641-4981, Http://www.[facility].com/services/adult.htm  Dental Assistance  If unable to pay or uninsured, contact:  [facility] or [place] [facility] to become qualified for the adult dental clinic.  Patients with Medicaid: [facility] [address], [phone], [phone]  If unable to pay, or uninsured, contact [facility] ([phone]) or [place] [facility] ([phone] in [place], [phone] in [place]) to become qualified for the adult dental clinic  Other Low-Cost Community Dental Services: - [facility]- [address], [place], [place], [address], [phone], [address], 2nd and 4th Thursday of the month at 6:30am.  10 clients each day by appointment, can sometimes see walk-in patients if someone does not show for an appointment. - [facility]- [address], [place], [place], [address], [phone] - [facility]- [address], [place], [place], [address], [phone] - [place] [facility]- [phone] - [facility]- [phone] - [facility]- [phone]     
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
## 2011-11-07 NOTE — ED Provider Notes (Signed)
Medical screening examination/treatment/procedure(s) were performed by non-physician practitioner and as supervising physician I was immediately available for consultation/collaboration. Devoria Albe, MD, FACEP   Ward Givens, MD 11/07/11 [phone]

## 2012-03-28 ENCOUNTER — Encounter (HOSPITAL_COMMUNITY): Payer: Self-pay | Admitting: Emergency Medicine

## 2012-03-28 ENCOUNTER — Emergency Department (HOSPITAL_COMMUNITY)
Admission: EM | Admit: 2012-03-28 | Discharge: 2012-03-28 | Disposition: A | Discharge status: Home / Self Care | Payer: Self-pay | Attending: Emergency Medicine | Admitting: Emergency Medicine

## 2012-03-28 DIAGNOSIS — F17200 Nicotine dependence, unspecified, uncomplicated: Secondary | ICD-10-CM | POA: Insufficient documentation

## 2012-03-28 DIAGNOSIS — N61 Mastitis without abscess: Secondary | ICD-10-CM | POA: Insufficient documentation

## 2012-03-28 DIAGNOSIS — I10 Essential (primary) hypertension: Secondary | ICD-10-CM | POA: Insufficient documentation

## 2012-03-28 DIAGNOSIS — N611 Abscess of the breast and nipple: Secondary | ICD-10-CM

## 2012-03-28 MED ORDER — DOXYCYCLINE HYCLATE 100 MG PO CAPS: 100.0000 mg | ORAL_CAPSULE | Freq: Two times a day (BID) | ORAL | Status: DC

## 2012-03-28 MED ORDER — DOXYCYCLINE HYCLATE 100 MG PO TABS
100.0000 mg | ORAL_TABLET | Freq: Once | ORAL | Status: AC
  Administered 2012-03-28: 100 mg via ORAL
  Filled 2012-03-28: qty 1

## 2012-03-28 NOTE — Discharge Instructions (Signed)
Heat Therapy Heat therapy can help ease achy, tense, stiff, and tight muscles and joints. Heat should not be used on new injuries. Wait at least 48 hours after the injury before using heat therapy. Heat also should not be used for discomfort or pain that occurs right after doing an activity. If you still have pain or stiffness 3 hours after finishing the activity, then heat therapy may be used. PRECAUTIONS  High heat or prolonged exposure to heat can cause burns. Be careful when using heat therapy to avoid burning your skin. If you have any of the following conditions, do not use heat until you have discussed heat therapy with your caregiver:  Poor circulation.  Healing wounds or scarred skin in the area being treated.  Diabetes, heart disease, or high blood pressure.  Numbness of the area being treated.  Unusual swelling of the area being treated.  Active infections.  Blood clots.  Cancer.  Inability to communicate your response to pain. This can include young children and people with dementia. HOME CARE INSTRUCTIONS Moist heat pack  Soak a clean towel in warm water, and squeeze out the extra water. The water temperature should be comfortable to the skin.  Put the warm, wet towel in a plastic bag.  Place a thin, dry towel between your skin and the bag.  Put the heat pack on the area for 5 minutes, and check your skin. Your skin may be pink, but it should not be red.  Leave the heat pack on the area for a total of 15 to 30 minutes.  Repeat this every 2 to 4 hours while awake. Do not use heat while you are sleeping. Warm water bath  Fill a tub with warm water. The water temperature should be comfortable to the skin.  Place the affected body part in the tub.  Soak the area for 20 to 40 minutes.  Repeat as needed. Hot water bottle  Fill the water bottle half full with hot water.  Press out the extra air. Close the cap tightly.  Place a dry towel between your skin and  the bottle.  Put the bottle on the area for 5 minutes, and check your skin. Your skin may be pink, but it should not be red.  Leave the bottle on the area for a total of 15 to 30 minutes.  Repeat this every 2 to 4 hours while awake. Electric heating pad  Place a dry towel between your skin and the heating pad.  Set the heating pad on low heat.  Put the heating pad on the area for 10 minutes, and check your skin. Your skin may be pink, but it should not be red.  Leave the heating pad on the area for a total of 20 to 40 minutes.  Repeat this every 2 to 4 hours while awake.  Do not lie on the heating pad.  Do not fall asleep while using the heating pad.  Do not use the heating pad near water. Contact with water can result in an electrical shock. SEEK MEDICAL CARE IF:  You have blisters, redness, swelling, or numbness.  You have any new problems.  Your problems are getting worse.  You have any questions or concerns. If you develop any problems, stop using heat therapy until you see your caregiver. MAKE SURE YOU:  Understand these instructions.  Will watch your condition.  Will get help right away if you are not doing well or get worse. Document Released: 07/01/2011  Document Reviewed: 07/01/2011 [facility] Patient Information 2013 [place], [place]. Abscess An abscess is an infected area that contains a collection of pus and debris.It can occur in almost any part of the body. An abscess is also known as a furuncle or boil. CAUSES  An abscess occurs when tissue gets infected. This can occur from blockage of oil or sweat glands, infection of hair follicles, or a minor injury to the skin. As the body tries to fight the infection, pus collects in the area and creates pressure under the skin. This pressure causes pain. People with weakened immune systems have difficulty fighting infections and get certain abscesses more often.  SYMPTOMS Usually an abscess develops on the skin and  becomes a painful mass that is red, warm, and tender. If the abscess forms under the skin, you may feel a moveable soft area under the skin. Some abscesses break open (rupture) on their own, but most will continue to get worse without care. The infection can spread deeper into the body and eventually into the bloodstream, causing you to feel ill.  DIAGNOSIS  Your caregiver will take your medical history and perform a physical exam. A sample of fluid may also be taken from the abscess to determine what is causing your infection. TREATMENT  Your caregiver may prescribe antibiotic medicines to fight the infection. However, taking antibiotics alone usually does not cure an abscess. Your caregiver may need to make a small cut (incision) in the abscess to drain the pus. In some cases, gauze is packed into the abscess to reduce pain and to continue draining the area. HOME CARE INSTRUCTIONS   Only take over-the-counter or prescription medicines for pain, discomfort, or fever as directed by your caregiver.  If you were prescribed antibiotics, take them as directed. Finish them even if you start to feel better.  If gauze is used, follow your caregiver's directions for changing the gauze.  To avoid spreading the infection:  Keep your draining abscess covered with a bandage.  Wash your hands well.  Do not share personal care items, towels, or whirlpools with others.  Avoid skin contact with others.  Keep your skin and clothes clean around the abscess.  Keep all follow-up appointments as directed by your caregiver. SEEK MEDICAL CARE IF:   You have increased pain, swelling, redness, fluid drainage, or bleeding.  You have muscle aches, chills, or a general ill feeling.  You have a fever. MAKE SURE YOU:   Understand these instructions.  Will watch your condition.  Will get help right away if you are not doing well or get worse. Document Released: 01/16/2005 Document Revised: 10/08/2011  Document Reviewed: 06/21/2011 [facility] Patient Information 2013 [place], [place].    Take the antibiotic as directed.  Apply warm compresses several times daily.  Return to the ED if your symptoms worsen or change significantly.

## 2012-03-28 NOTE — ED Notes (Signed)
Witness in room with Neville Route, PA during exam of pt's right breast

## 2012-03-28 NOTE — ED Notes (Signed)
Patient with c/o right breast pain. Abscess noted. Redness.

## 2012-03-28 NOTE — ED Provider Notes (Signed)
Medical screening examination/treatment/procedure(s) were performed by non-physician practitioner and as supervising physician I was immediately available for consultation/collaboration.   Charles B. Bernette Mayers, MD 03/28/12 1352

## 2012-03-28 NOTE — ED Notes (Signed)
Pt with redness to right medial breast, tender to touch, denies fever or N/V

## 2012-03-28 NOTE — ED Provider Notes (Signed)
History     CSN: [id number]  Arrival date & time 03/28/12  1052   First MD Initiated Contact with Patient 03/28/12 1130      Chief Complaint  Patient presents with  . Breast Pain  . Abscess    (Consider location/radiation/quality/duration/timing/severity/associated sxs/prior treatment) HPI Comments: States she noticed a small pimple like lesion on her R breast yest.  Today she says it is swollen and painful.  No  PCP   Patient is a 26 y.o. female presenting with abscess. The history is provided by the patient. No language interpreter was used.  Abscess  This is a new problem. The current episode started yesterday. The problem has been gradually worsening. The problem is mild. The abscess is characterized by painfulness. The abscess first occurred at home. Pertinent negatives include no fever. There were no sick contacts. She has received no recent medical care.    Past Medical History  Diagnosis Date  . Hypertension     History reviewed. No pertinent past surgical history.  No family history on file.  History  Substance Use Topics  . Smoking status: Current Every Day Smoker    Types: Cigarettes  . Smokeless tobacco: Not on file  . Alcohol Use: Yes     Comment: occ    OB History    Grav Para Term Preterm Abortions TAB SAB Ect Mult Living                  Review of Systems  Constitutional: Negative for fever and chills.  Skin:       Abscess   All other systems reviewed and are negative.    Allergies  Hydrocodone; Naproxen; and Tramadol  Home Medications   Current Outpatient Rx  Name  Route  Sig  Dispense  Refill  . DOXYCYCLINE HYCLATE 100 MG PO CAPS   Oral   Take 1 capsule (100 mg total) by mouth 2 (two) times daily.   20 capsule   0     BP 153/106  Pulse 60  Temp 97.7 F (36.5 C) (Oral)  Resp 17  Ht 5\' 9"  (1.753 m)  Wt 289 lb (131.09 kg)  BMI 42.68 kg/m2  SpO2 98%  Physical Exam  Nursing note and vitals reviewed. Constitutional: She  is oriented to person, place, and time. She appears well-developed and well-nourished. No distress.  HENT:  Head: Normocephalic and atraumatic.  Eyes: EOM are normal.  Neck: Normal range of motion.  Cardiovascular: Normal rate, regular rhythm and normal heart sounds.   Pulmonary/Chest: Effort normal and breath sounds normal.    Abdominal: Soft. She exhibits no distension. There is no tenderness.  Musculoskeletal: Normal range of motion.  Neurological: She is alert and oriented to person, place, and time.  Skin: Skin is warm and dry.  Psychiatric: She has a normal mood and affect. Judgment normal.    ED Course  Procedures (including critical care time)  Labs Reviewed - No data to display No results found.   1. Abscess of right breast       MDM  rx-doxycycline, 20 Warm compresses Return to ED prn        Evalina Field, PA 03/28/12 1150

## 2012-05-01 ENCOUNTER — Emergency Department (HOSPITAL_COMMUNITY)
Admission: EM | Admit: 2012-05-01 | Discharge: 2012-05-01 | Disposition: A | Discharge status: Home / Self Care | Payer: Self-pay | Attending: Emergency Medicine | Admitting: Emergency Medicine

## 2012-05-01 ENCOUNTER — Emergency Department (HOSPITAL_COMMUNITY): Payer: Self-pay

## 2012-05-01 DIAGNOSIS — R6883 Chills (without fever): Secondary | ICD-10-CM | POA: Insufficient documentation

## 2012-05-01 DIAGNOSIS — R6889 Other general symptoms and signs: Secondary | ICD-10-CM | POA: Insufficient documentation

## 2012-05-01 DIAGNOSIS — I10 Essential (primary) hypertension: Secondary | ICD-10-CM | POA: Insufficient documentation

## 2012-05-01 DIAGNOSIS — F17200 Nicotine dependence, unspecified, uncomplicated: Secondary | ICD-10-CM | POA: Insufficient documentation

## 2012-05-01 DIAGNOSIS — B349 Viral infection, unspecified: Secondary | ICD-10-CM

## 2012-05-01 DIAGNOSIS — J029 Acute pharyngitis, unspecified: Secondary | ICD-10-CM | POA: Insufficient documentation

## 2012-05-01 DIAGNOSIS — R110 Nausea: Secondary | ICD-10-CM | POA: Insufficient documentation

## 2012-05-01 DIAGNOSIS — IMO0001 Reserved for inherently not codable concepts without codable children: Secondary | ICD-10-CM | POA: Insufficient documentation

## 2012-05-01 DIAGNOSIS — J3489 Other specified disorders of nose and nasal sinuses: Secondary | ICD-10-CM | POA: Insufficient documentation

## 2012-05-01 DIAGNOSIS — B9789 Other viral agents as the cause of diseases classified elsewhere: Secondary | ICD-10-CM | POA: Insufficient documentation

## 2012-05-01 MED ORDER — ALBUTEROL SULFATE (5 MG/ML) 0.5% IN NEBU
2.5000 mg | INHALATION_SOLUTION | Freq: Once | RESPIRATORY_TRACT | Status: AC
  Administered 2012-05-01: 2.5 mg via RESPIRATORY_TRACT
  Filled 2012-05-01: qty 0.5

## 2012-05-01 MED ORDER — OXYCODONE-ACETAMINOPHEN 5-325 MG PO TABS
2.0000 | ORAL_TABLET | Freq: Once | ORAL | Status: AC
  Administered 2012-05-01: 2 via ORAL
  Filled 2012-05-01: qty 2

## 2012-05-01 MED ORDER — ACETAMINOPHEN 325 MG PO TABS
650.0000 mg | ORAL_TABLET | Freq: Once | ORAL | Status: AC
  Administered 2012-05-01: 650 mg via ORAL

## 2012-05-01 MED ORDER — ACETAMINOPHEN 325 MG PO TABS
ORAL_TABLET | ORAL | Status: AC
  Filled 2012-05-01: qty 2

## 2012-05-01 MED ORDER — ONDANSETRON 4 MG PO TBDP
4.0000 mg | ORAL_TABLET | Freq: Once | ORAL | Status: AC
  Administered 2012-05-01: 4 mg via ORAL
  Filled 2012-05-01: qty 1

## 2012-05-01 NOTE — ED Provider Notes (Signed)
History     CSN: [id number]  Arrival date & time 05/01/12  0307   First MD Initiated Contact with Patient 05/01/12 [phone]      No chief complaint on file.   (Consider location/radiation/quality/duration/timing/severity/associated sxs/prior treatment) HPI JOSETTA WIGAL is a 27 y.o. female who presents to the Emergency Department complaining of cough, body aches, nausea, chills, x 2 days. She has take cough syrup without relief.  Past Medical History  Diagnosis Date  . Hypertension     No past surgical history on file.  No family history on file.  History  Substance Use Topics  . Smoking status: Current Every Day Smoker    Types: Cigarettes  . Smokeless tobacco: Not on file  . Alcohol Use: Yes     Comment: occ    OB History    Grav Para Term Preterm Abortions TAB SAB Ect Mult Living                  Review of Systems  Constitutional: Negative for fever.       10 Systems reviewed and are negative for acute change except as noted in the HPI.  HENT: Positive for congestion, sore throat and sneezing.   Eyes: Negative for discharge and redness.  Respiratory: Positive for cough. Negative for shortness of breath.   Cardiovascular: Negative for chest pain.  Gastrointestinal: Negative for vomiting and abdominal pain.  Musculoskeletal: Positive for myalgias. Negative for back pain.  Skin: Negative for rash.  Neurological: Negative for syncope, numbness and headaches.  Psychiatric/Behavioral:       No behavior change.    Allergies  Hydrocodone; Naproxen; and Tramadol  Home Medications   Current Outpatient Rx  Name  Route  Sig  Dispense  Refill  . DOXYCYCLINE HYCLATE 100 MG PO CAPS   Oral   Take 1 capsule (100 mg total) by mouth 2 (two) times daily.   20 capsule   0     BP 157/84  Pulse 80  Temp 100.9 F (38.3 C) (Oral)  Resp 18  Ht 6' (1.829 m)  Wt 300 lb (136.079 kg)  BMI 40.69 kg/m2  SpO2 95%  LMP 04/15/2012  Physical Exam  Nursing note and  vitals reviewed. Constitutional: She appears well-developed and well-nourished.       Awake, alert, nontoxic appearance.  HENT:  Head: Atraumatic.  Right Ear: External ear normal.  Left Ear: External ear normal.  Nose: Nose normal.  Mouth/Throat: Oropharynx is clear and moist.  Eyes: Conjunctivae normal and EOM are normal. Pupils are equal, round, and reactive to light. Right eye exhibits no discharge. Left eye exhibits no discharge.  Neck: Neck supple.  Cardiovascular: Normal heart sounds.   Pulmonary/Chest: Effort normal. She has wheezes. She exhibits no tenderness.       Coarse cough  Abdominal: Soft. There is no tenderness. There is no rebound.  Musculoskeletal: She exhibits no tenderness.       Baseline ROM, no obvious new focal weakness.  Neurological:       Mental status and motor strength appears baseline for patient and situation.  Skin: No rash noted.  Psychiatric: She has a normal mood and affect.    ED Course  Procedures (including critical care time)  Dg Chest 2 View  05/01/2012  *RADIOLOGY REPORT*  Clinical Data: Cough, fever and congestion.  CHEST - 2 VIEW  Comparison: PA and lateral chest 03/20/2010 and 06/06/2005.  Findings: Lungs are clear.  Heart size is normal.  No  pneumothorax or pleural fluid.  IMPRESSION: No acute disease.   Original Report Authenticated By: Holley Dexter, M.D.        MDM  Patient with cough, body aches, sore throat, nausea x 2 days. PE with wheezing on exam. Given albuterol nebulizer treatment with relief of wheezing. Xray without acute findings. Ibuprofen and analgesic improved the aches and pains. Reviewed results with the patient.  Pt feels improved after observation and/or treatment in ED.Pt stable in ED with no significant deterioration in condition.The patient appears reasonably screened and/or stabilized for discharge and I doubt any other medical condition or other [facility] requiring further screening, evaluation, or treatment in the ED  at this time prior to discharge.  MDM Reviewed: nursing note and vitals Interpretation: x-ray           Nicoletta Dress. Colon Branch, MD 05/01/12 [phone]

## 2012-05-01 NOTE — Discharge Instructions (Signed)
Your xray did NOT show a bronchitis or pneumonia. Drink lots of fluids. Use tylenol or ibuprofen for aches, pains and any fevers. Continue to use the cough medicine as needed.

## 2012-05-01 NOTE — ED Notes (Signed)
Pt reports flu like symptoms for 2 days.  Cough, body aches, possible fever.  Reports mild nausea, no vomiting.  Pt reports she just started taking cough syrup, has had one dose, no relief.

## 2012-05-01 NOTE — ED Notes (Signed)
Pt alert & oriented x4, stable gait. Patient given discharge instructions, paperwork & prescription(s). Patient  instructed to stop at the registration desk to finish any additional paperwork. Patient verbalized understanding. Pt left department w/ no further questions. 

## 2012-05-01 NOTE — ED Notes (Signed)
Respiratory paged for a breathing treatment at this time.  

## 2012-05-04 ENCOUNTER — Encounter (HOSPITAL_COMMUNITY): Payer: Self-pay | Admitting: *Deleted

## 2012-05-04 ENCOUNTER — Emergency Department (HOSPITAL_COMMUNITY)
Admission: EM | Admit: 2012-05-04 | Discharge: 2012-05-04 | Disposition: A | Discharge status: Home / Self Care | Payer: Self-pay | Attending: Emergency Medicine | Admitting: Emergency Medicine

## 2012-05-04 DIAGNOSIS — R509 Fever, unspecified: Secondary | ICD-10-CM | POA: Insufficient documentation

## 2012-05-04 DIAGNOSIS — R059 Cough, unspecified: Secondary | ICD-10-CM | POA: Insufficient documentation

## 2012-05-04 DIAGNOSIS — IMO0001 Reserved for inherently not codable concepts without codable children: Secondary | ICD-10-CM | POA: Insufficient documentation

## 2012-05-04 DIAGNOSIS — R110 Nausea: Secondary | ICD-10-CM | POA: Insufficient documentation

## 2012-05-04 DIAGNOSIS — J029 Acute pharyngitis, unspecified: Secondary | ICD-10-CM | POA: Insufficient documentation

## 2012-05-04 DIAGNOSIS — R0602 Shortness of breath: Secondary | ICD-10-CM | POA: Insufficient documentation

## 2012-05-04 DIAGNOSIS — F17200 Nicotine dependence, unspecified, uncomplicated: Secondary | ICD-10-CM | POA: Insufficient documentation

## 2012-05-04 DIAGNOSIS — R05 Cough: Secondary | ICD-10-CM | POA: Insufficient documentation

## 2012-05-04 DIAGNOSIS — I10 Essential (primary) hypertension: Secondary | ICD-10-CM | POA: Insufficient documentation

## 2012-05-04 DIAGNOSIS — J111 Influenza due to unidentified influenza virus with other respiratory manifestations: Secondary | ICD-10-CM

## 2012-05-04 MED ORDER — PROMETHAZINE HCL 25 MG PO TABS: 25.0000 mg | ORAL_TABLET | Freq: Four times a day (QID) | ORAL | Status: DC | PRN

## 2012-05-04 MED ORDER — KETOROLAC TROMETHAMINE 60 MG/2ML IM SOLN
60.0000 mg | Freq: Once | INTRAMUSCULAR | Status: AC
  Administered 2012-05-04: 60 mg via INTRAMUSCULAR
  Filled 2012-05-04: qty 2

## 2012-05-04 MED ORDER — ONDANSETRON 8 MG PO TBDP
8.0000 mg | ORAL_TABLET | Freq: Once | ORAL | Status: AC
  Administered 2012-05-04: 8 mg via ORAL
  Filled 2012-05-04: qty 1

## 2012-05-04 MED ORDER — TRAMADOL HCL 50 MG PO TABS: 50.0000 mg | ORAL_TABLET | Freq: Four times a day (QID) | ORAL | Status: DC | PRN

## 2012-05-04 MED ORDER — OXYCODONE-ACETAMINOPHEN 5-325 MG PO TABS: 1.0000 | ORAL_TABLET | ORAL | Status: DC | PRN

## 2012-05-04 NOTE — Discharge Instructions (Signed)
Take medicines as prescribed - return to ER for severe or worsening symptoms.  Read the FLU instructions attached.  You will feel bad for 7-10 days.  Tylenol or Ibuprofen for fever.  RESOURCE GUIDE  Chronic Pain Problems: Contact [facility]  [phone] Patients need to be referred by their primary care doctor.  Insufficient Money for Medicine: Contact United Way:  call "211."   No Primary Care Doctor: - Call Health Connect  [phone] - can help you locate a primary care doctor that  accepts your insurance, provides certain services, etc. - Physician Referral Service- [phone]  Agencies that provide inexpensive medical care: - [facility]  [phone] - [facility]  [phone] - [facility]  [phone] - Women's Clinic  [phone] - Planned Parenthood  [phone] - [facility]  [phone]  Medicaid-accepting [facility] Providers: - [facility]- [address]  [phone], Mon-Fri 9am-7pm, Sat 9am-1pm - [facility]- [address] [place], [address] [place]  [phone] - [facility]- [address], [address] [place]  [phone] [facility]- [address] [place] [address]  [phone] - Renaye Rakers- [address] [place], [address], [phone]  Only accepts [place] patients after they have their name  applied to their card  Self Pay (no insurance) in [place]: - Sickle Cell Patients: Dr Willey Blade, [facility]  [address] [place], [phone] - [facility]- [address] [place]  [phone]       [facility] [place]- [address]       -     [facility]- see information above (Speak to [employer] if you do not have insurance)       -  [facility]- [address],  [phone]       -  [facility]- [address] [phone]       -  Dr Julio Sicks-   [address], [place], [phone]       -  [facility] - [address], [phone]       -  [facility]- [address], [phone], also [address], [phone]       -    [facility]- [address] [place], [phone], 1st & 3rd Saturday        every month, 10am-1pm  1) Find a Doctor and Pay Out of Pocket Although you won't have to find out who is covered by your insurance plan, it is a good idea to ask around and get recommendations. You will then need to call the office and see if the doctor you have chosen will accept you as a new patient and what types of options they offer for patients who are self-pay. Some doctors offer discounts or will set up payment plans for their patients who do not have insurance, but you will need to ask so you aren't surprised when you get to your appointment.  2) Contact Your Local Health Department Not all health departments have doctors that can see patients for sick visits, but many do, so it is worth a call to see if yours does. If you don't know where your local health department is, you can check in your phone book.  The CDC also has a tool to help you locate your state's health department, and many state websites also have listings of all of their local health departments.  3) Find a Walk-in Clinic If your illness is not likely to be very severe or complicated, you may want to try a walk in clinic. These are popping up all over the country in pharmacies, drugstores, and shopping centers. They're usually staffed by nurse practitioners or physician assistants that have been trained to treat common illnesses and complaints. They're usually fairly quick and inexpensive. However, if you have serious medical issues or chronic medical problems, these are probably not your best option  STD Testing - [facility] Department of [facility], STD Clinic, [address] [place],  phone [phone] or [phone].  Monday - Friday, call for an appointment. [facility] Department of [employer], STD Clinic, [address], [place], phone [phone] or [phone].  Monday - Friday, call for an appointment.  Abuse/Neglect: [facility] Child Abuse Hotline 332-783-1537 [facility] Child Abuse Hotline (509)002-1742 (After Hours)  Emergency Shelter:  [facility] (647)151-1791  Maternity Homes: - Room at the [place] [facility] (304) [phone] - [facility] [phone]  MRSA Hotline #:   718-376-4713  [facility] Resources [facility] [place]  United [employer] [facility] Dept. [address]                 [address] [place] [address]         [address] [place] [address]  Blondell Reveal Phone:  [phone]                                  Phone:  [phone]                   Phone:  [phone]  [facility], [phone] - [facility]- [phone]       -     [facility] in [place], [address],             [phone], Insurance  [place] [place] Child Abuse Hotline 978 225 3078 or 386-477-9277 (After Hours)  Dental Assistance  If unable to pay or uninsured, contact:  [facility]. to become qualified for the adult dental clinic.  Patients with Medicaid: [facility] [phone] [address], [phone] [address] [phone]  If unable to pay, or uninsured, contact [facility] [phone] in [place], [phone] in [facility]) to become qualified for the adult dental clinic  Other Low-Cost Community Dental Services: - [facility]- [address] [place], [place], [place], [address], [phone], Ext. 123, 2nd and 4th Thursday of the month at 6:30am.  10  clients each day by appointment, can sometimes see walk-in patients if someone does not show for an appointment. -  [facility]- [address] [place], [place], [address], [phone] - [facility] [address], [place], [place], [address], [phone] - [facility]- [phone] [facility] Health Department- [phone] [facility] [place] [phone]       Behavioral Health Resources in the [facility]  Intensive Outpatient Programs: [facility]      [address] [place], [place] [phone] Both a day and evening program       [facility] Outpatient     [address]        [place], [place] [address] [phone]         ADS: [facility] S[facility] [address] [place] [phone]  [facility] Mental Health ACCESS LINE: (573) [phone] or [phone] [address] [place], [place] [address] [email or web address]  Behavioral Health Services  Substance Abuse Resources: - Alcohol and Drug Services  2243519054 - [facility] (445)504-1278 - [facility] [place] [phone] Floydene Flock (236)012-8334 - Residential & Outpatient Substance Abuse Program  [phone]  Psychological Services: [facility]  [phone] [facility] Services  [phone] - [facility], [phone] [place]. [address], [place], ACCESS LINE: 2151341919 or (919) 331-0337, [email or web address]  Mobile Crisis Teams:                                        Therapeutic Alternatives         Mobile Crisis Care Unit (848)034-7135             [facility] [address] Ginette Otto [phone]                                         Interventionist [address], [address] [place] [phone]  Self-Help/Support Groups: Mental Health  Assoc. of [employer] of support groups (478)884-0883 (call for more info)   Narcotics Anonymous (NA) Caring Services [address] [place] - 2 meetings at this location  Residential Treatment Programs:  [facility]      [phone]        [place]       [phone]         [facility] [address] [place] [address] [place], [place]  [address] [phone]  [facility]  [address] [place], [place] [address] [phone] Admissions: 8am-3pm M-F  [facility]     [address]        [place], [place] [address]       [phone]         [facility] [address] [place], [place] [phone]  [facility] [address] [place], [place] [phone]  [facility] - Intensive Outpatient      [address]     [place], [place]       [phone]         [facility] (Add[facility]     [address] [place], [place] [phone] or [phone]  Residential Treatment Services (RTS), Medicaid [address] [place], [place] [phone]  [facility]  [address] [place] [phone]  [facility] Resources: [place]- [phone]               General Therapy                                                Angie Fava, PhD        [address] [place], [place] [address]         [phone]   Insurance  [facility] Behavioral   [address] [place], [place] [address] [phone]  [facility] [address] [place], [place] [address] [phone] Insurance/Medicaid/sponsorship through [facility]                                              [address]                                        [place], [place] [address]    Therapy/tele-psych/case                  [phone]          [facility] [address], [place]  [address]  Adolescent/group home/case management [phone]                                           Creola Corn PhD       General therapy       Insurance   [phone]         Dr. Lolly Mustache, Insurance, M-F [phone]

## 2012-05-04 NOTE — ED Provider Notes (Addendum)
History     CSN: [id number]  Arrival date & time 05/04/12  4098   First MD Initiated Contact with Patient 05/04/12 0631      Chief Complaint  Patient presents with  . Generalized Body Aches  . Shortness of Breath  . Nausea    (Consider location/radiation/quality/duration/timing/severity/associated sxs/prior treatment) HPI Comments: Pt presents with c/o fevers, chills, sore throat and a cough.  Has been going on for several days, constant, better with tylenol, persistent sx otherwise.  Has associated myalgias.  She was seen in ED and dx with viral synddrome 3 days ago but sx persist.  Tolerating PO  Patient is a 27 y.o. female presenting with shortness of breath. The history is provided by the patient.  Shortness of Breath  Associated symptoms include shortness of breath.    Past Medical History  Diagnosis Date  . Hypertension     History reviewed. No pertinent past surgical history.  History reviewed. No pertinent family history.  History  Substance Use Topics  . Smoking status: Current Every Day Smoker    Types: Cigarettes  . Smokeless tobacco: Not on file  . Alcohol Use: Yes     Comment: occ    OB History    Grav Para Term Preterm Abortions TAB SAB Ect Mult Living                  Review of Systems  Respiratory: Positive for shortness of breath.   All other systems reviewed and are negative.    Allergies  Hydrocodone; Naproxen; and Tramadol  Home Medications   Current Outpatient Rx  Name  Route  Sig  Dispense  Refill  . DOXYCYCLINE HYCLATE 100 MG PO CAPS   Oral   Take 1 capsule (100 mg total) by mouth 2 (two) times daily.   20 capsule   0   . OXYCODONE-ACETAMINOPHEN 5-325 MG PO TABS   Oral   Take 1 tablet by mouth every 4 (four) hours as needed for pain.   10 tablet   0   . PROMETHAZINE HCL 25 MG PO TABS   Oral   Take 1 tablet (25 mg total) by mouth every 6 (six) hours as needed for nausea.   12 tablet   0   . TRAMADOL HCL 50 MG PO  TABS   Oral   Take 1 tablet (50 mg total) by mouth every 6 (six) hours as needed for pain.   15 tablet   0     BP 157/77  Pulse 96  Temp 101.5 F (38.6 C) (Oral)  Resp 20  Ht 6' (1.829 m)  Wt 300 lb (136.079 kg)  BMI 40.69 kg/m2  SpO2 99%  LMP 04/15/2012  Physical Exam  Nursing note and vitals reviewed. Constitutional: She appears well-developed and well-nourished. No distress.  HENT:  Head: Normocephalic and atraumatic.  Mouth/Throat: No oropharyngeal exudate.       Pharynx erythematous with no exudate, hypertrophy or assymetry.  Eyes: Conjunctivae normal and EOM are normal. Pupils are equal, round, and reactive to light. Right eye exhibits no discharge. Left eye exhibits no discharge. No scleral icterus.  Neck: Normal range of motion. Neck supple. No JVD present. No thyromegaly present.  Cardiovascular: Normal rate, regular rhythm, normal heart sounds and intact distal pulses.  Exam reveals no gallop and no friction rub.   No murmur heard. Pulmonary/Chest: Effort normal and breath sounds normal. No respiratory distress. She has no wheezes. She has no rales.  Abdominal: Soft.  Bowel sounds are normal. She exhibits no distension and no mass. There is no tenderness.  Musculoskeletal: Normal range of motion. She exhibits no edema and no tenderness.  Lymphadenopathy:    She has no cervical adenopathy.  Neurological: She is alert. Coordination normal.  Skin: Skin is warm and dry. No rash noted. No erythema.  Psychiatric: She has a normal mood and affect. Her behavior is normal.    ED Course  Procedures (including critical care time)  Labs Reviewed - No data to display No results found.   1. Flu       MDM  Normal lung sounds, normal heart rate, fever.  She has a flu like illness with fever, and URI sx, but no signs of pna.  Pt given pain meds, and Rx for pain meds - will f/u as outpatient as needed.  Had CXR in ED 3 nights ago which was normal.        Vida Roller, MD 05/04/12 1610  Vida Roller, MD 05/04/12 [phone]

## 2012-05-04 NOTE — ED Notes (Signed)
Pt c/o body aches sob and chills. Pt states she was seen and treated in ed and has had no improvement.

## 2012-08-28 ENCOUNTER — Encounter (HOSPITAL_COMMUNITY): Payer: Self-pay | Admitting: *Deleted

## 2012-08-28 ENCOUNTER — Emergency Department (HOSPITAL_COMMUNITY)
Admission: EM | Admit: 2012-08-28 | Discharge: 2012-08-28 | Disposition: A | Discharge status: Home / Self Care | Payer: Self-pay | Attending: Emergency Medicine | Admitting: Emergency Medicine

## 2012-08-28 DIAGNOSIS — L02419 Cutaneous abscess of limb, unspecified: Secondary | ICD-10-CM | POA: Insufficient documentation

## 2012-08-28 DIAGNOSIS — L02416 Cutaneous abscess of left lower limb: Secondary | ICD-10-CM

## 2012-08-28 DIAGNOSIS — F17200 Nicotine dependence, unspecified, uncomplicated: Secondary | ICD-10-CM | POA: Insufficient documentation

## 2012-08-28 DIAGNOSIS — I10 Essential (primary) hypertension: Secondary | ICD-10-CM | POA: Insufficient documentation

## 2012-08-28 LAB — PREGNANCY, URINE: Preg Test, Ur: NEGATIVE

## 2012-08-28 MED ORDER — LIDOCAINE-EPINEPHRINE 2 %-1:100000 IJ SOLN: 10.0000 mL | Freq: Once | INTRAMUSCULAR | Status: DC

## 2012-08-28 MED ORDER — OXYCODONE-ACETAMINOPHEN 5-325 MG PO TABS: 1.0000 | ORAL_TABLET | ORAL | Status: DC | PRN

## 2012-08-28 MED ORDER — LIDOCAINE-EPINEPHRINE (PF) 2 %-1:200000 IJ SOLN
INTRAMUSCULAR | Status: AC
  Filled 2012-08-28: qty 20

## 2012-08-28 MED ORDER — IBUPROFEN 400 MG PO TABS
600.0000 mg | ORAL_TABLET | Freq: Once | ORAL | Status: AC
  Administered 2012-08-28: 600 mg via ORAL
  Filled 2012-08-28: qty 2

## 2012-08-28 MED ORDER — OXYCODONE-ACETAMINOPHEN 5-325 MG PO TABS
2.0000 | ORAL_TABLET | Freq: Once | ORAL | Status: AC
  Administered 2012-08-28: 2 via ORAL
  Filled 2012-08-28: qty 2

## 2012-08-28 MED ORDER — LIDOCAINE HCL (PF) 2 % IJ SOLN
INTRAMUSCULAR | Status: AC
  Filled 2012-08-28: qty 10

## 2012-08-28 NOTE — ED Notes (Addendum)
Abscess to labia for 4-5 days, vaginal bleeding.lower abd pain

## 2012-08-28 NOTE — ED Notes (Signed)
Patient with no complaints at this time. Respirations even and unlabored. Skin warm/dry. Discharge instructions reviewed with patient at this time. Patient given opportunity to voice concerns/ask questions. Patient discharged at this time and left Emergency Department with steady gait.   

## 2012-08-28 NOTE — Discharge Instructions (Signed)
Abscess An abscess is an infected area that contains a collection of pus and debris.It can occur in almost any part of the body. An abscess is also known as a furuncle or boil. CAUSES  An abscess occurs when tissue gets infected. This can occur from blockage of oil or sweat glands, infection of hair follicles, or a minor injury to the skin. As the body tries to fight the infection, pus collects in the area and creates pressure under the skin. This pressure causes pain. People with weakened immune systems have difficulty fighting infections and get certain abscesses more often.  SYMPTOMS Usually an abscess develops on the skin and becomes a painful mass that is red, warm, and tender. If the abscess forms under the skin, you may feel a moveable soft area under the skin. Some abscesses break open (rupture) on their own, but most will continue to get worse without care. The infection can spread deeper into the body and eventually into the bloodstream, causing you to feel ill.  DIAGNOSIS  Your caregiver will take your medical history and perform a physical exam. A sample of fluid may also be taken from the abscess to determine what is causing your infection. TREATMENT  Your caregiver may prescribe antibiotic medicines to fight the infection. However, taking antibiotics alone usually does not cure an abscess. Your caregiver may need to make a small cut (incision) in the abscess to drain the pus. In some cases, gauze is packed into the abscess to reduce pain and to continue draining the area. HOME CARE INSTRUCTIONS   Only take over-the-counter or prescription medicines for pain, discomfort, or fever as directed by your caregiver.  If you were prescribed antibiotics, take them as directed. Finish them even if you start to feel better.  If gauze is used, follow your caregiver's directions for changing the gauze.  To avoid spreading the infection:  Keep your draining abscess covered with a  bandage.  Wash your hands well.  Do not share personal care items, towels, or whirlpools with others.  Avoid skin contact with others.  Keep your skin and clothes clean around the abscess.  Keep all follow-up appointments as directed by your caregiver. SEEK MEDICAL CARE IF:   You have increased pain, swelling, redness, fluid drainage, or bleeding.  You have muscle aches, chills, or a general ill feeling.  You have a fever. MAKE SURE YOU:   Understand these instructions.  Will watch your condition.  Will get help right away if you are not doing well or get worse. Document Released: 01/16/2005 Document Revised: 10/08/2011 Document Reviewed: 06/21/2011 [facility] Patient Information 2013 [place], [place].  RESOURCE GUIDE  Chronic Pain Problems: Contact [facility]  [phone] Patients need to be referred by their primary care doctor.  Insufficient Money for Medicine: Contact United Way:  call "211."   No Primary Care Doctor: - Call Health Connect  [phone] - can help you locate a primary care doctor that  accepts your insurance, provides certain services, etc. - Physician Referral [phone]  Agencies that provide inexpensive medical care: - [facility]  [phone] - [facility]  [phone] - [facility]  [phone] - Women's Clinic  [phone] - Planned Parenthood  [phone] - [facility]  [phone]  Medicaid-accepting [facility] Providers: - [facility]- [address]  [phone], Mon-Fri 9am-7pm, Sat 9am-1pm - [facility]- [address] [place], [address] [place]  [phone] -  [facility]- [address] [place]  [phone] [facility]- [address]  [phone] - Renaye Rakers- [address] [place], [address], [phone]  Only accepts [place] patients after they have their name  applied  to their card  Self Pay (no insurance) in [facility]: - Sickle Cell Patients: Dr Willey Blade, [facility]  [address] [place], [phone] - [facility]- [address] [employer]  [phone]       [facility] [place]- [address]       -     [facility]- see information above (Speak to [employer] if you do not have insurance)       -  [facility]- [address],  [phone]       -  [facility]- [address], [phone]       -  Dr Julio Sicks-  [address], [place], [phone]       -  Urgent Medical and [facility] - [address] [phone]       -  [facility]- [address] [phone], also [address] [phone]       -    [facility]- [address] [place], [phone], 1st & 3rd Saturday        every month, 10am-1pm  [facility] [address] [place], [place] [address] [phone]  [facility] [address] [place], [place] [address] [phone]  1) Find a Doctor and Pay Out of Pocket Although you won't have to find out who is covered by your insurance plan, it is a good idea to ask around and get recommendations. You will then need to call the office and see if the doctor you have chosen will accept you as a new patient and what types of options they offer for patients who are self-pay. Some doctors offer discounts or will set up payment plans for their patients who do not have insurance, but you will need to ask so you aren't surprised when you get to your appointment.  2) Contact Your Local Health Department Not all health departments have doctors that can see patients for sick visits, but many do, so it is worth a call to see if yours does. If you don't know where your local health department is, you can check in your phone book. The CDC also has a tool to help you locate your state's  health department, and many state websites also have listings of all of their local health departments.  3) Find a Walk-in Clinic If your illness is not likely to be very severe or complicated, you may want to try a walk in clinic. These are popping up all over the country in pharmacies, drugstores, and shopping centers. They're usually staffed by nurse practitioners or physician assistants that have been trained to treat common illnesses and complaints. They're usually fairly quick and inexpensive. However, if you have serious medical issues or chronic medical problems, these are probably not your best option  STD Testing - [facility] Department of [facility], STD Clinic, [address] [place], phone [phone] or [phone].  Monday - Friday, call for an appointment. [facility] Department of [employer], STD Clinic, [address], [place], phone [phone] or [phone].  Monday - Friday, call for an appointment.  Abuse/Neglect: [facility] Child Abuse Hotline 820-120-9137 [facility] Child Abuse Hotline (251)327-5235 (After Hours)  Emergency Shelter:  [facility] 437-422-3331  Maternity Homes: - [facility] ([phone])[phone] - [facility] [phone]  MRSA Hotline #:   276-115-5364  Dental Assistance If unable to pay or uninsured, contact:  [facility] to become qualified for the adult dental clinic.  Patients with Medicaid: [facility] [phone], [phone] [address], [phone]  If unable to pay, or uninsured, contact [facility] [phone] in [place], [phone] in [facility]) to become qualified for the adult dental clinic  [facility] [address] [place], [place] [address] [phone] [email or web address]  Other Proofreader Services: - [facility]-  [address] [place], [place], [place], [address], [phone], Ext. [phone], 2nd and 4th Thursday of the month at 6:30am.  10 clients each day by appointment, can sometimes see walk-in patients if someone does not show for an appointment. [facility]- [address] [place], [place], [address], [phone] - [facility] [address], [place], [place], [address], [phone] - [facility]- [phone] [facility] Health Department- [phone] [facility] Health [phone]       Behavioral Health Resources in the [facility]  Intensive Outpatient Programs: [facility]      [address] [place], [place] [phone] Both a day and evening program       [facility] Out[facility]     [address]        [place], [place] [address] [phone]         ADS: [facility] [address] [place] [phone]  [facility] Mental Health ACCESS LINE: [phone]) [phone] or [phone] [address] [place], [place] [address] [email or web address]   Substance Abuse Resources: - [facility] Drug [facility]  [phone] - [facility] [phone] - [facility] [phone] Floydene Flock [phone] - Residential & Outpatient Substance Abuse Program  [phone]  Psychological Services: [facility]  [phone] [facility] Services  [phone] - [facility], [phone] [place]. [address] [place], ACCESS LINE: 812-267-0094 or [phone], [email or web address]  Mobile Crisis Teams:                                        Therapeutic Alternatives         Mobile Crisis Care Unit [phone]             [facility] [address] Ginette Otto [phone]                                         Interventionist [address] [place] [address] [place] [phone]  Self-Help/Support Groups: Mental Health Assoc. of [employer] of support groups [phone] (call for more info)   Narcotics Anonymous (NA) Caring Services [address] [place] - 2 meetings at this location  Residential [employer]:  [facility] Residential Treatment      [phone] [employer]  [place]       [phone]         [facility] [address] [place] [address] [place], [place]  [address] [phone]  [facility]  [address] [place], [place] [address] [phone] Admissions: 8am-3pm M-F  [facility]     [address]        [place], [place] [address]       [phone]         [facility] [address] [place], [place] [phone]  [facility] [address] [place], [place] [phone]  [facility] - Intensive Outpatient      [address]     [place], [place]       [phone]         [facility] ([facility]     [address] [place], [place] [phone] or [phone]  Residential Treatment Services (RTS), Medicaid [address] [place], [place] [phone]  [facility] [address]                                               [address] [place] [phone]  [facility] Resources: [phone]               General Therapy                                                Angie Fava, PhD        [address] [place], [place] [address]         [phone]   Insurance  [facility]   [address] [place], [place] [address] [phone]  [facility] [address] [place], [place] [address] [phone] Insurance/Medicaid/sponsorship through [facility] and Families                                              [address]                                         [place], [place] [address]    Therapy/tele-psych/case         [phone]          [facility] [address], [place]  [address]  Adolescent/group home/case management [phone]                                           Creola Corn PhD       General therapy       Insurance   [phone]  Dr. Lolly Mustache, Insurance, M-F [phone]  [facility]  United [employer] [facility] Dept. [address]                 [address]         [address] [place] [address]  Blondell Reveal Phone:  [phone]                                  Phone:  [phone]                   Phone:  [phone]  [facility] Mental Health, [phone] - [facility] - [facility]- [phone]       -     [facility] in [place], [address],             [phone], Insurance  [place] Child Abuse Hotline 623-445-4463 or [phone] (After Hours)

## 2012-08-28 NOTE — ED Notes (Addendum)
Patient states she has "boil" on perineum at inside of L thigh.  Has put H2O2 + cottonball on it overnight.  Some bleeding noted.  She also states she has had 2 menstrual cycles w/in a month and is experiencing pelvic pain at present.  Provided peripads for her.

## 2012-09-02 NOTE — ED Provider Notes (Signed)
History    27 year old female with a "boil" to her left side. She first noticed this about 4 days ago. Progressively getting larger in size and more painful. Some spontaneous drainage. No fevers or chills. No urinary complaints. Patient has tried putting hydrogen peroxide on it.  CSN: [id number]  Arrival date & time 08/28/12  1442   First MD Initiated Contact with Patient 08/28/12 1606      Chief Complaint  Patient presents with  . Abscess    (Consider location/radiation/quality/duration/timing/severity/associated sxs/prior treatment) HPI  Past Medical History  Diagnosis Date  . Hypertension     History reviewed. No pertinent past surgical history.  History reviewed. No pertinent family history.  History  Substance Use Topics  . Smoking status: Current Every Day Smoker    Types: Cigarettes  . Smokeless tobacco: Not on file  . Alcohol Use: Yes     Comment: occ    OB History   Grav Para Term Preterm Abortions TAB SAB Ect Mult Living                  Review of Systems  All systems reviewed and negative, other than as noted in HPI.   Allergies  Hydrocodone; Naproxen; and Tramadol  Home Medications   Current Outpatient Rx  Name  Route  Sig  Dispense  Refill  . oxyCODONE-acetaminophen (PERCOCET/ROXICET) 5-325 MG per tablet   Oral   Take 1 tablet by mouth every 4 (four) hours as needed for pain.   8 tablet   0     BP 148/85  Pulse 95  Temp(Src) 98.9 F (37.2 C) (Oral)  Resp 20  Ht 5\' 11"  (1.803 m)  Wt 300 lb (136.079 kg)  BMI 41.86 kg/m2  SpO2 100%  LMP 08/14/2012  Physical Exam  Nursing note and vitals reviewed. Constitutional: She appears well-developed and well-nourished. No distress.  HENT:  Head: Normocephalic and atraumatic.  Eyes: Conjunctivae are normal. Right eye exhibits no discharge. Left eye exhibits no discharge.  Neck: Neck supple.  Cardiovascular: Normal rate, regular rhythm and normal heart sounds.  Exam reveals no gallop and no  friction rub.   No murmur heard. Pulmonary/Chest: Effort normal and breath sounds normal. No respiratory distress.  Abdominal: Soft. She exhibits no distension. There is no tenderness.  Musculoskeletal: She exhibits no edema and no tenderness.  Neurological: She is alert.  Skin: Skin is warm and dry.  Tender fluctuant lesion to proximal medial L thigh consistent with abscess. 3cm.   Psychiatric: She has a normal mood and affect. Her behavior is normal. Thought content normal.    ED Course  Procedures (including critical care time)  INCISION AND DRAINAGE Performed by: Raeford Razor Consent: Verbal consent obtained. Risks and benefits: risks, benefits and alternatives were discussed Type: abscess  Body area: L medial proximal thigh  Anesthesia: local infiltration  Incision was made with a scalpel.  Local anesthetic: lidocaine 2 % w epinephrine  Anesthetic total: 3  ml  Complexity: complex Blunt dissection to break up loculations  Drainage: purulent  Drainage amount: small  Patient tolerance: Patient tolerated the procedure well with no immediate complications.    Labs Reviewed  PREGNANCY, URINE   No results found.   1. Abscess of left thigh       MDM  27 year old female with an abscess the left thigh. This was incised and drained. No surrounding cellulitis. Plan continued wound care and sitz baths. Return precautions discussed. When necessary pain medication. We'll 27 year old female with  a "boil" to her left side.       Raeford Razor, MD 09/02/12 2329

## 2013-01-08 ENCOUNTER — Encounter (HOSPITAL_COMMUNITY): Payer: Self-pay | Admitting: *Deleted

## 2013-01-08 ENCOUNTER — Emergency Department (HOSPITAL_COMMUNITY)
Admission: EM | Admit: 2013-01-08 | Discharge: 2013-01-09 | Disposition: A | Discharge status: Home / Self Care | Payer: Self-pay | Attending: Emergency Medicine | Admitting: Emergency Medicine

## 2013-01-08 DIAGNOSIS — I10 Essential (primary) hypertension: Secondary | ICD-10-CM | POA: Insufficient documentation

## 2013-01-08 DIAGNOSIS — F17200 Nicotine dependence, unspecified, uncomplicated: Secondary | ICD-10-CM | POA: Insufficient documentation

## 2013-01-08 DIAGNOSIS — J029 Acute pharyngitis, unspecified: Secondary | ICD-10-CM | POA: Insufficient documentation

## 2013-01-08 DIAGNOSIS — J209 Acute bronchitis, unspecified: Secondary | ICD-10-CM | POA: Insufficient documentation

## 2013-01-08 DIAGNOSIS — J40 Bronchitis, not specified as acute or chronic: Secondary | ICD-10-CM

## 2013-01-08 DIAGNOSIS — IMO0001 Reserved for inherently not codable concepts without codable children: Secondary | ICD-10-CM | POA: Insufficient documentation

## 2013-01-08 HISTORY — DX: Bronchitis, not specified as acute or chronic: J40

## 2013-01-08 MED ORDER — ACETAMINOPHEN 500 MG PO TABS
ORAL_TABLET | ORAL | Status: AC
  Administered 2013-01-08: 1000 mg
  Filled 2013-01-08: qty 2

## 2013-01-08 NOTE — ED Notes (Signed)
Sore throat, fever, chills, nasal congestion

## 2013-01-09 ENCOUNTER — Encounter (HOSPITAL_COMMUNITY): Payer: Self-pay

## 2013-01-09 LAB — RAPID STREP SCREEN (MED CTR MEBANE ONLY): Streptococcus, Group A Screen (Direct): NEGATIVE

## 2013-01-09 MED ORDER — AZITHROMYCIN 250 MG PO TABS: 500.0000 mg | ORAL_TABLET | Freq: Once | ORAL | Status: DC

## 2013-01-09 NOTE — ED Provider Notes (Signed)
Medical screening examination/treatment/procedure(s) were performed by non-physician practitioner and as supervising physician I was immediately available for consultation/collaboration.   Joya Gaskins, MD 01/09/13 [phone]

## 2013-01-09 NOTE — ED Provider Notes (Signed)
CSN: [id number]     Arrival date & time 01/08/13  2258 History   First MD Initiated Contact with Patient 01/08/13 2342     Chief Complaint  Patient presents with  . Sore Throat   (Consider location/radiation/quality/duration/timing/severity/associated sxs/prior Treatment) HPI Comments: Patient here with fever, sore throat, cough with green sputum production.  States had symptoms for the past several days - partner initially was ill - now she is.  Reports body aches and arthralgias.  Denies nausea vomiting, abdominal pain.  Patient is a 27 y.o. female presenting with pharyngitis. The history is provided by the patient. No language interpreter was used.  Sore Throat This is a new problem. The current episode started in the past 7 days. The problem occurs constantly. The problem has been unchanged. Associated symptoms include arthralgias, chills, coughing, a fever, myalgias and a sore throat. Pertinent negatives include no abdominal pain, chest pain, fatigue, nausea, numbness, swollen glands, visual change, vomiting or weakness. The symptoms are aggravated by swallowing. She has tried nothing for the symptoms. The treatment provided no relief.    Past Medical History  Diagnosis Date  . Hypertension   . Bronchitis    History reviewed. No pertinent past surgical history. History reviewed. No pertinent family history. History  Substance Use Topics  . Smoking status: Current Every Day Smoker    Types: Cigarettes  . Smokeless tobacco: Not on file  . Alcohol Use: Yes     Comment: occ   OB History   Grav Para Term Preterm Abortions TAB SAB Ect Mult Living                 Review of Systems  Constitutional: Positive for fever and chills. Negative for fatigue.  HENT: Positive for sore throat.   Respiratory: Positive for cough.   Cardiovascular: Negative for chest pain.  Gastrointestinal: Negative for nausea, vomiting and abdominal pain.  Musculoskeletal: Positive for myalgias and  arthralgias.  Neurological: Negative for weakness and numbness.  All other systems reviewed and are negative.    Allergies  Hydrocodone; Naproxen; and Tramadol  Home Medications   Current Outpatient Rx  Name  Route  Sig  Dispense  Refill  . oxyCODONE-acetaminophen (PERCOCET/ROXICET) 5-325 MG per tablet   Oral   Take 1 tablet by mouth every 4 (four) hours as needed for pain.   8 tablet   0    BP 156/96  Pulse 95  Temp(Src) 99.2 F (37.3 C) (Oral)  Resp 20  Ht 5\' 11"  (1.803 m)  Wt 293 lb (132.904 kg)  BMI 40.88 kg/m2  SpO2 99%  LMP 01/04/2013 Physical Exam  Nursing note and vitals reviewed. Constitutional: She is oriented to person, place, and time. She appears well-developed and well-nourished. No distress.  febrile  HENT:  Head: Normocephalic and atraumatic.  Right Ear: External ear normal.  Left Ear: External ear normal.  Mouth/Throat: Oropharynx is clear and moist. No oropharyngeal exudate.  Boggy nasal mucosa  Eyes: Conjunctivae are normal. Pupils are equal, round, and reactive to light. No scleral icterus.  Neck: Normal range of motion. Neck supple.  Cardiovascular: Normal rate, regular rhythm and normal heart sounds.  Exam reveals no gallop and no friction rub.   No murmur heard. Pulmonary/Chest: Effort normal and breath sounds normal. No respiratory distress. She has no wheezes. She has no rales. She exhibits no tenderness.  Abdominal: Soft. She exhibits no distension. There is no tenderness. There is no rebound and no guarding.  Musculoskeletal: Normal range of  motion. She exhibits no edema and no tenderness.  Lymphadenopathy:    She has no cervical adenopathy.  Neurological: She is alert and oriented to person, place, and time. No cranial nerve deficit. She exhibits normal muscle tone. Coordination normal.  Skin: Skin is warm and dry. No rash noted. No erythema. No pallor.  Psychiatric: She has a normal mood and affect. Her behavior is normal. Judgment and  thought content normal.    ED Course  Procedures (including critical care time) Labs Review Labs Reviewed  RAPID STREP SCREEN  CULTURE, GROUP A STREP   Imaging Review No results found.  MDM  Bronchitis   Patient here with fever, cough, sore throat and nasal congestion.  Likely bronchitis - strep negative.  Will start on abx.  May take tylenol for the aches.   Izola Price Marisue [place], [place] 01/09/13 [phone]

## 2013-01-09 NOTE — Discharge Instructions (Signed)
Bronchitis Bronchitis is the body's way of reacting to injury and/or infection (inflammation) of the bronchi. Bronchi are the air tubes that extend from the windpipe into the lungs. If the inflammation becomes severe, it may cause shortness of breath. CAUSES  Inflammation may be caused by:  A virus.  Germs (bacteria).  Dust.  Allergens.  Pollutants and many other irritants. The cells lining the bronchial tree are covered with tiny hairs (cilia). These constantly beat upward, away from the lungs, toward the mouth. This keeps the lungs free of pollutants. When these cells become too irritated and are unable to do their job, mucus begins to develop. This causes the characteristic cough of bronchitis. The cough clears the lungs when the cilia are unable to do their job. Without either of these protective mechanisms, the mucus would settle in the lungs. Then you would develop pneumonia. Smoking is a common cause of bronchitis and can contribute to pneumonia. Stopping this habit is the single most important thing you can do to help yourself. TREATMENT   Your caregiver may prescribe an antibiotic if the cough is caused by bacteria. Also, medicines that open up your airways make it easier to breathe. Your caregiver may also recommend or prescribe an expectorant. It will loosen the mucus to be coughed up. Only take over-the-counter or prescription medicines for pain, discomfort, or fever as directed by your caregiver.  Removing whatever causes the problem (smoking, for example) is critical to preventing the problem from getting worse.  Cough suppressants may be prescribed for relief of cough symptoms.  Inhaled medicines may be prescribed to help with symptoms now and to help prevent problems from returning.  For those with recurrent (chronic) bronchitis, there may be a need for steroid medicines. SEEK IMMEDIATE MEDICAL CARE IF:   During treatment, you develop more pus-like mucus (purulent  sputum).  You have a fever.  Your baby is older than 3 months with a rectal temperature of 102 F (38.9 C) or higher.  Your baby is 38 months old or younger with a rectal temperature of 100.4 F (38 C) or higher.  You become progressively more ill.  You have increased difficulty breathing, wheezing, or shortness of breath. It is necessary to seek immediate medical care if you are elderly or sick from any other disease. MAKE SURE YOU:   Understand these instructions.  Will watch your condition.  Will get help right away if you are not doing well or get worse. Document Released: 04/08/2005 Document Revised: 07/01/2011 Document Reviewed: 02/16/2008 [facility]  Patient Information 2014 [place], [place].  Salt Water Gargle This solution will help make your mouth and throat feel better. HOME CARE INSTRUCTIONS   Mix 1 teaspoon of salt in 8 ounces of warm water.  Gargle with this solution as much or often as you need or as directed. Swish and gargle gently if you have any sores or wounds in your mouth.  Do not swallow this mixture. Document Released: 01/11/2004 Document Revised: 07/01/2011 Document Reviewed: 06/03/2008 [facility] Patient Information 2014 [place], [place].  Viral and Bacterial Pharyngitis Pharyngitis is soreness (inflammation) or infection of the pharynx. It is also called a sore throat. CAUSES  Most sore throats are caused by viruses and are part of a cold. However, some sore throats are caused by strep and other bacteria. Sore throats can also be caused by post nasal drip from draining sinuses, allergies and sometimes from sleeping with an open mouth. Infectious sore throats can be spread from person to person by  coughing, sneezing and sharing cups or eating utensils. TREATMENT  Sore throats that are viral usually last 3-4 days. Viral illness will get better without medications (antibiotics). Strep throat and other bacterial infections will usually begin to get better about  24-48 hours after you begin to take antibiotics. HOME CARE INSTRUCTIONS   If the caregiver feels there is a bacterial infection or if there is a positive strep test, they will prescribe an antibiotic. The full course of antibiotics must be taken. If the full course of antibiotic is not taken, you or your child may become ill again. If you or your child has strep throat and do not finish all of the medication, serious heart or kidney diseases may develop.  Drink enough water and fluids to keep your urine clear or pale yellow.  Only take over-the-counter or prescription medicines for pain, discomfort or fever as directed by your caregiver.  Get lots of rest.  Gargle with salt water ( tsp. of salt in a glass of water) as often as every 1-2 hours as you need for comfort.  Hard candies may soothe the throat if individual is not at risk for choking. Throat sprays or lozenges may also be used. SEEK MEDICAL CARE IF:   Large, tender lumps in the neck develop.  A rash develops.  Green, yellow-brown or bloody sputum is coughed up.  Your baby is older than 3 months with a rectal temperature of 100.5 F (38.1 C) or higher for more than 1 day. SEEK IMMEDIATE MEDICAL CARE IF:   A stiff neck develops.  You or your child are drooling or unable to swallow liquids.  You or your child are vomiting, unable to keep medications or liquids down.  You or your child has severe pain, unrelieved with recommended medications.  You or your child are having difficulty breathing (not due to stuffy nose).  You or your child are unable to fully open your mouth.  You or your child develop redness, swelling, or severe pain anywhere on the neck.  You have a fever.  Your baby is older than 3 months with a rectal temperature of 102 F (38.9 C) or higher.  Your baby is 14 months old or younger with a rectal temperature of 100.4 F (38 C) or higher. MAKE SURE YOU:   Understand these instructions.  Will  watch your condition.  Will get help right away if you are not doing well or get worse. Document Released: 04/08/2005 Document Revised: 07/01/2011 Document Reviewed: 07/06/2007 [facility] Patient Information 2014 [place], [place].

## 2013-01-11 LAB — CULTURE, GROUP A STREP

## 2013-01-31 ENCOUNTER — Emergency Department (HOSPITAL_COMMUNITY)
Admission: EM | Admit: 2013-01-31 | Discharge: 2013-01-31 | Disposition: A | Discharge status: Home / Self Care | Payer: Self-pay | Attending: Emergency Medicine | Admitting: Emergency Medicine

## 2013-01-31 ENCOUNTER — Encounter (HOSPITAL_COMMUNITY): Payer: Self-pay | Admitting: Emergency Medicine

## 2013-01-31 DIAGNOSIS — R5381 Other malaise: Secondary | ICD-10-CM | POA: Insufficient documentation

## 2013-01-31 DIAGNOSIS — IMO0001 Reserved for inherently not codable concepts without codable children: Secondary | ICD-10-CM | POA: Insufficient documentation

## 2013-01-31 DIAGNOSIS — R599 Enlarged lymph nodes, unspecified: Secondary | ICD-10-CM | POA: Insufficient documentation

## 2013-01-31 DIAGNOSIS — J029 Acute pharyngitis, unspecified: Secondary | ICD-10-CM | POA: Insufficient documentation

## 2013-01-31 DIAGNOSIS — M542 Cervicalgia: Secondary | ICD-10-CM | POA: Insufficient documentation

## 2013-01-31 DIAGNOSIS — F17200 Nicotine dependence, unspecified, uncomplicated: Secondary | ICD-10-CM | POA: Insufficient documentation

## 2013-01-31 DIAGNOSIS — I10 Essential (primary) hypertension: Secondary | ICD-10-CM | POA: Insufficient documentation

## 2013-01-31 LAB — RAPID STREP SCREEN (MED CTR MEBANE ONLY): Streptococcus, Group A Screen (Direct): NEGATIVE

## 2013-01-31 MED ORDER — AMOXICILLIN 500 MG PO CAPS: 500.0000 mg | ORAL_CAPSULE | Freq: Three times a day (TID) | ORAL | Status: DC

## 2013-01-31 MED ORDER — AMOXICILLIN 250 MG PO CAPS
500.0000 mg | ORAL_CAPSULE | Freq: Once | ORAL | Status: AC
  Administered 2013-01-31: 500 mg via ORAL
  Filled 2013-01-31: qty 2

## 2013-01-31 NOTE — Discharge Instructions (Signed)
Viral and Bacterial Pharyngitis Pharyngitis is soreness (inflammation) or infection of the pharynx. It is also called a sore throat. CAUSES  Most sore throats are caused by viruses and are part of a cold. However, some sore throats are caused by strep and other bacteria. Sore throats can also be caused by post nasal drip from draining sinuses, allergies and sometimes from sleeping with an open mouth. Infectious sore throats can be spread from person to person by coughing, sneezing and sharing cups or eating utensils. TREATMENT  Sore throats that are viral usually last 3-4 days. Viral illness will get better without medications (antibiotics). Strep throat and other bacterial infections will usually begin to get better about 24-48 hours after you begin to take antibiotics. HOME CARE INSTRUCTIONS   If the caregiver feels there is a bacterial infection or if there is a positive strep test, they will prescribe an antibiotic. The full course of antibiotics must be taken. If the full course of antibiotic is not taken, you or your child may become ill again. If you or your child has strep throat and do not finish all of the medication, serious heart or kidney diseases may develop.  Drink enough water and fluids to keep your urine clear or pale yellow.  Only take over-the-counter or prescription medicines for pain, discomfort or fever as directed by your caregiver.  Get lots of rest.  Gargle with salt water ( tsp. of salt in a glass of water) as often as every 1-2 hours as you need for comfort.  Hard candies may soothe the throat if individual is not at risk for choking. Throat sprays or lozenges may also be used. SEEK MEDICAL CARE IF:   Large, tender lumps in the neck develop.  A rash develops.  Green, yellow-brown or bloody sputum is coughed up.  Your baby is older than 3 months with a rectal temperature of 100.5 F (38.1 C) or higher for more than 1 day. SEEK IMMEDIATE MEDICAL CARE IF:   A  stiff neck develops.  You or your child are drooling or unable to swallow liquids.  You or your child are vomiting, unable to keep medications or liquids down.  You or your child has severe pain, unrelieved with recommended medications.  You or your child are having difficulty breathing (not due to stuffy nose).  You or your child are unable to fully open your mouth.  You or your child develop redness, swelling, or severe pain anywhere on the neck.  You have a fever.  Your baby is older than 3 months with a rectal temperature of 102 F (38.9 C) or higher.  Your baby is 31 months old or younger with a rectal temperature of 100.4 F (38 C) or higher. MAKE SURE YOU:   Understand these instructions.  Will watch your condition.  Will get help right away if you are not doing well or get worse. Document Released: 04/08/2005 Document Revised: 07/01/2011 Document Reviewed: 07/06/2007 [facility] Patient Information 2014 [place], [place].   RESOURCE GUIDE  Chronic Pain Problems: Contact [facility]  [phone] Patients need to be referred by their primary care doctor.  Insufficient Money for Medicine: Contact United Way:  call [phone]  No Primary Care Doctor: - Call Health Connect  [phone] - can help you locate a primary care doctor that  accepts your insurance, provides certain services, etc. - Physician Referral [phone]  Agencies that provide inexpensive medical care: - [facility]  [phone] -  [facility]  [phone] - [facility]  [phone] [facility]  [phone] - [facility] Parenthood  [phone] [facility]  [phone]  Medicaid-accepting [facility] Providers: - [facility]- [address]  [phone], Mon-Fri 9am-7pm, Sat 9am-1pm - [facility]- [address] [place], [place]  [phone] - [facility]- [address] [place]  [phone] [facility]- [address]  [phone] - Renaye Rakers- [address] [place], [address], [phone]  Only accepts [place] patients after they have their name  applied to their card  Self Pay (no insurance) in [facility]: - Sickle Cell Patients - [facility]  [address] [place], [phone] - [facility]- [address] [place]  [phone]       [facility] [place]- [address]       -     [facility]- see information above (Speak to [employer] if you do not have insurance)       -  [facility]- [address],  [phone]       -  [facility]- [address] [phone]       -  Dr Julio Sicks-  [address], [place], [phone]       -  [facility] - [address] [phone]       -  [facility]- [address] [phone], also [address] [phone]       -     [facility]- [address] [place], [phone], 1st & 3rd Saturday         every month, 10am-1pm  -     [facility]   [address] [place], [place].   Phone:  [phone], Fax:  [phone]. Hours of Operation:  9 am - 6 pm, M-F.  -     [facility]   [address], [place]   Phone: [phone], Fax: [phone]. Hours of Operation:  8:30 am - 5:30 pm, M-F.  [facility] [address] [place], [place] [address] [phone]  [facility] [address] [place], [place] [address] [phone]  1) Find a Doctor and Pay Out of Pocket Although you won't have to find out who is covered by your insurance plan, it is a good idea to ask around and get recommendations. You will then need to call the office and see if the doctor you have chosen will accept you as a new patient and what types of options they  offer for patients who are self-pay. Some doctors offer discounts or will set up payment plans for their patients who do not have insurance, but you will need to ask so you aren't surprised when you get to your appointment.  2) Contact Your Local Health Department Not all health departments have doctors that can see patients for sick visits, but many do, so it is worth a call to see if yours does. If you don't know where your local health department is, you can check in your phone book. The CDC also has a tool to help you locate your state's health department, and many state websites  also have listings of all of their local health departments.  3) Find a Walk-in Clinic If your illness is not likely to be very severe or complicated, you may want to try a walk in clinic. These are popping up all over the country in pharmacies, drugstores, and shopping centers. They're usually staffed by nurse practitioners or physician assistants that have been trained to treat common illnesses and complaints. They're usually fairly quick and inexpensive. However, if you have serious medical issues or chronic medical problems, these are probably not your best option  STD Testing - [facility] Department of [facility], STD Clinic, [address] [place], phone [phone] or [phone].  Monday - Friday, call for an appointment. [facility] Department of [employer], STD Clinic, [address], [place], phone [phone] or [phone].  Monday - Friday, call for an appointment.  Abuse/Neglect: [facility] Child Abuse Hotline 669-298-6060 [facility] Child Abuse Hotline 816-240-5801 (After Hours)  Emergency Shelter:  [facility] [phone]  Maternity Homes: - Room at the [place] [facility] [phone] - [facility] [phone]  MRSA Hotline #:   831-633-5757  Dental Assistance If unable to pay or uninsured,  contact:  [facility] to become qualified for the adult dental clinic.  Patients with Medicaid: [facility] [phone] [address], [phone] [address], [phone]  If unable to pay, or uninsured, contact [facility] [phone] in [place], [phone] in [facility]) to become qualified for the adult dental clinic  [facility] [address] [place], [place] [address] [phone] [email or web address]  Other Proofreader Services: - [facility]- [address] [place], [place], [place], [address], [phone], Ext. [phone], 2nd and 4th Thursday of the month at 6:30am.  10 clients each day by appointment, can sometimes see walk-in patients if someone does not show for an appointment. [facility]- [address] [place], [place], [address], [phone] - [facility] [place] [address] [place], [place], [address], [phone] - [facility]- [phone] [facility] Health Department- [phone] [facility] Health [phone]       Behavioral Health Resources in the [facility]  Intensive Outpatient Programs: [facility]      [address] [place], [place] [phone] Both a day and evening program       [facility]     [address]        [place], [place] [address] [phone]         ADS: [facility] [address] [place] [phone]  [facility] Mental Health ACCESS LINE: [phone] or [phone] [address] [place], [place] [address] [email or web address]   Substance Abuse Resources: - Alcohol and Drug Services  [phone] - [facility] [phone] - The [place] [phone] Floydene Flock [phone] - Residential & Outpatient Substance Abuse Program  [phone]  Psychological  Services: [facility]  [phone] [facility]  [phone] - [facility], [phone] [place]. [address] [place], ACCESS LINE: 819-195-5485 or (857)[phone], [email or web address]  Mobile Crisis Teams:                                        [facility] Alternatives  Mobile Crisis Care Unit [phone]             [facility] [address] [place] [phone]                                         Interventionist [address] [place] [address] [place] [phone]  Self-Help/Support Groups: Mental Health Assoc. of [employer] of support groups 252-023-8169 (call for more info)  Narcotics Anonymous (NA) Caring Services [address] [place] - 2 meetings at this location  Residential Treatment Programs:  [facility]      [address]        [place]       [phone]         [facility] [address] [place] [address]34 [place], [place]  [address] [phone]  [facility]  [address] [place], [place] [address] [phone] Admissions: 8am-3pm M-F  [facility]     [address]        [place], [place] [address]       [phone]         [facility] [address] [place], [place] [phone]  [facility] [address] [place], [place] [phone]  [facility] - Intensive Outpatient      [address]     [place], [place]       [phone]         [facility] (Addiction Recovery Care [facility].)     [address] [place], [place] [phone] or [phone]  Residential Treatment Services (RTS), Medicaid [address] [place], [place] [phone]  [facility]ellowship [address]                                               [address] [place] [phone]  [facility] Resources: [facility]  Human [phone]               General Therapy                                                Angie Fava, PhD        [address] [place], [place] [address]         [phone]   Insurance  [facility]   [address] [place], [place] [address] [phone]  [facility] [address] [place], [place] [address] [phone] Insurance/Medicaid/sponsorship through [facility]  Faith and Families                                              [address] Suite [phone]  [place], [place] [address]    Therapy/tele-psych/case         [phone]          [facility] [address], [place]  [address]  Adolescent/group home/case management [phone]                                           Creola Corn PhD       General therapy       Insurance   [phone]         Dr. Lolly Mustache, [place], M-F [phone]  [facility] [place]  [employer] [facility] Dept. [address]                 [address] [place] [address]         [address] [place] [address]  Blondell Reveal Phone:  [phone]                                  Phone:  [phone]                   Phone:  [phone]  [facility], [phone] - [facility] - [facility] [place]- [phone]       -     [facility] in [place], [address],             [phone], Insurance  [place] Child Abuse Hotline 340-585-2002 or 9895118649 (After Hours)     Take 2 more doses of amoxil today.  Rest,  Drink plenty of fluids.  You may take tylenol or motrin if needed for pain relief.

## 2013-01-31 NOTE — ED Notes (Signed)
Pt  C/o sore throat x 1 week. Nad. Denies fevers.

## 2013-01-31 NOTE — ED Provider Notes (Signed)
Medical screening examination/treatment/procedure(s) were performed by non-physician practitioner and as supervising physician I was immediately available for consultation/collaboration.  Siegfried Vieth F Jericca Russett, MD 01/31/13 1943 

## 2013-01-31 NOTE — ED Provider Notes (Signed)
CSN: [id number]     Arrival date & time 01/31/13  1228 History   First MD Initiated Contact with Patient 01/31/13 1231     Chief Complaint  Patient presents with  . Sore Throat   (Consider location/radiation/quality/duration/timing/severity/associated sxs/prior Treatment) Patient is a 27 y.o. female presenting with pharyngitis. The history is provided by the patient.  Sore Throat This is a new problem. The current episode started in the past 7 days. The problem occurs constantly. The problem has been gradually worsening. Associated symptoms include fatigue, myalgias, neck pain, a sore throat and swollen glands. Pertinent negatives include no abdominal pain, arthralgias, chest pain, congestion, coughing, diaphoresis, fever, headaches, joint swelling, nausea, numbness, rash or weakness. The symptoms are aggravated by swallowing. She has tried acetaminophen for the symptoms. The treatment provided no relief.    Past Medical History  Diagnosis Date  . Hypertension   . Bronchitis    History reviewed. No pertinent past surgical history. History reviewed. No pertinent family history. History  Substance Use Topics  . Smoking status: Current Every Day Smoker    Types: Cigarettes  . Smokeless tobacco: Not on file  . Alcohol Use: Yes     Comment: occ   OB History   Grav Para Term Preterm Abortions TAB SAB Ect Mult Living                 Review of Systems  Constitutional: Positive for fatigue. Negative for fever and diaphoresis.  HENT: Positive for sore throat. Negative for congestion.   Eyes: Negative.   Respiratory: Negative for cough, chest tightness and shortness of breath.   Cardiovascular: Negative for chest pain.  Gastrointestinal: Negative for nausea and abdominal pain.  Genitourinary: Negative.   Musculoskeletal: Positive for myalgias and neck pain. Negative for arthralgias and joint swelling.  Skin: Negative.  Negative for rash and wound.  Neurological: Negative for  dizziness, weakness, light-headedness, numbness and headaches.  Hematological: Positive for adenopathy.  Psychiatric/Behavioral: Negative.     Allergies  Hydrocodone; Naproxen; and Tramadol  Home Medications   Current Outpatient Rx  Name  Route  Sig  Dispense  Refill  . acetaminophen (TYLENOL) 500 MG tablet   Oral   Take 1,000 mg by mouth every 6 (six) hours as needed for pain.         Marland Kitchen amoxicillin (AMOXIL) 500 MG capsule   Oral   Take 1 capsule (500 mg total) by mouth 3 (three) times daily.   29 capsule   0    BP 166/102  Pulse 95  Temp(Src) 98.1 F (36.7 C)  Resp 17  SpO2 100%  LMP 01/31/2013 Physical Exam  Constitutional: She is oriented to person, place, and time. She appears well-developed and well-nourished. No distress.  HENT:  Head: Normocephalic and atraumatic.  Right Ear: Tympanic membrane and ear canal normal.  Left Ear: Tympanic membrane and ear canal normal.  Nose: No mucosal edema or rhinorrhea.  Mouth/Throat: Uvula is midline and mucous membranes are normal. Oropharyngeal exudate and posterior oropharyngeal erythema present. No posterior oropharyngeal edema or tonsillar abscesses.  Eyes: Conjunctivae are normal.  Cardiovascular: Normal rate and normal heart sounds.   Pulmonary/Chest: Effort normal. No respiratory distress. She has no wheezes. She has no rales.  Abdominal: Soft. There is no tenderness.  Musculoskeletal: Normal range of motion.  Lymphadenopathy:       Head (right side): Tonsillar adenopathy present.       Head (left side): Tonsillar adenopathy present.  Neurological: She is alert  and oriented to person, place, and time.  Skin: Skin is warm and dry. No rash noted.  Psychiatric: She has a normal mood and affect.    ED Course  Procedures (including critical care time) Labs Review Labs Reviewed  RAPID STREP SCREEN   Imaging Review No results found.  EKG Interpretation   None       MDM   1. Pharyngitis, acute    Pt  placed on amoxil,  First dose given here.  Exam c/w acute pharyngitis without classic uri sx.  Will tx for presumptive strep.  Pt was advised to stop smoking.  She was also informed about elevated bp.  Resources given for establishing pcp.   Burgess Amor, PA-C 01/31/13 1320

## 2013-01-31 NOTE — ED Notes (Signed)
Pt states has no pcp so has not had any bp meds since she was 27 years old

## 2013-02-02 LAB — CULTURE, GROUP A STREP

## 2013-06-24 ENCOUNTER — Encounter (HOSPITAL_COMMUNITY): Payer: Self-pay | Admitting: Emergency Medicine

## 2013-06-24 ENCOUNTER — Emergency Department (HOSPITAL_COMMUNITY)
Admission: EM | Admit: 2013-06-24 | Discharge: 2013-06-24 | Disposition: A | Discharge status: Home / Self Care | Payer: Self-pay | Attending: Emergency Medicine | Admitting: Emergency Medicine

## 2013-06-24 DIAGNOSIS — Z8709 Personal history of other diseases of the respiratory system: Secondary | ICD-10-CM | POA: Insufficient documentation

## 2013-06-24 DIAGNOSIS — A088 Other specified intestinal infections: Secondary | ICD-10-CM | POA: Insufficient documentation

## 2013-06-24 DIAGNOSIS — E876 Hypokalemia: Secondary | ICD-10-CM | POA: Insufficient documentation

## 2013-06-24 DIAGNOSIS — R5381 Other malaise: Secondary | ICD-10-CM | POA: Insufficient documentation

## 2013-06-24 DIAGNOSIS — F17200 Nicotine dependence, unspecified, uncomplicated: Secondary | ICD-10-CM | POA: Insufficient documentation

## 2013-06-24 DIAGNOSIS — Z79899 Other long term (current) drug therapy: Secondary | ICD-10-CM | POA: Insufficient documentation

## 2013-06-24 DIAGNOSIS — R5383 Other fatigue: Secondary | ICD-10-CM

## 2013-06-24 DIAGNOSIS — I10 Essential (primary) hypertension: Secondary | ICD-10-CM | POA: Insufficient documentation

## 2013-06-24 DIAGNOSIS — R110 Nausea: Secondary | ICD-10-CM | POA: Insufficient documentation

## 2013-06-24 DIAGNOSIS — Z3202 Encounter for pregnancy test, result negative: Secondary | ICD-10-CM | POA: Insufficient documentation

## 2013-06-24 DIAGNOSIS — A084 Viral intestinal infection, unspecified: Secondary | ICD-10-CM

## 2013-06-24 LAB — CBC WITH DIFFERENTIAL/PLATELET
Basophils Absolute: 0 10*3/uL (ref 0.0–0.1)
Basophils Relative: 0 % (ref 0–1)
Eosinophils Absolute: 0.3 10*3/uL (ref 0.0–0.7)
Eosinophils Relative: 2 % (ref 0–5)
HCT: 40.8 % (ref 36.0–46.0)
Hemoglobin: 13.6 g/dL (ref 12.0–15.0)
Lymphocytes Relative: 15 % (ref 12–46)
Lymphs Abs: 2 10*3/uL (ref 0.7–4.0)
MCH: 27.6 pg (ref 26.0–34.0)
MCHC: 33.3 g/dL (ref 30.0–36.0)
MCV: 82.8 fL (ref 78.0–100.0)
Monocytes Absolute: 1.5 10*3/uL — ABNORMAL HIGH (ref 0.1–1.0)
Monocytes Relative: 11 % (ref 3–12)
Neutro Abs: 9.4 10*3/uL — ABNORMAL HIGH (ref 1.7–7.7)
Neutrophils Relative %: 72 % (ref 43–77)
Platelets: 396 10*3/uL (ref 150–400)
RBC: 4.93 MIL/uL (ref 3.87–5.11)
RDW: 14.3 % (ref 11.5–15.5)
WBC: 13.2 10*3/uL — ABNORMAL HIGH (ref 4.0–10.5)

## 2013-06-24 LAB — URINE MICROSCOPIC-ADD ON

## 2013-06-24 LAB — BASIC METABOLIC PANEL
BUN: 9 mg/dL (ref 6–23)
CO2: 24 mEq/L (ref 19–32)
Calcium: 9.2 mg/dL (ref 8.4–10.5)
Chloride: 105 mEq/L (ref 96–112)
Creatinine, Ser: 0.85 mg/dL (ref 0.50–1.10)
GFR calc Af Amer: >90 mL/min (ref >90)
GFR calc non Af Amer: >90 mL/min (ref >90)
Glucose, Bld: 90 mg/dL (ref 70–99)
Potassium: 3.3 mEq/L — ABNORMAL LOW (ref 3.7–5.3)
Sodium: 139 mEq/L (ref 137–147)

## 2013-06-24 LAB — URINALYSIS, ROUTINE W REFLEX MICROSCOPIC
Bilirubin Urine: NEGATIVE
Glucose, UA: NEGATIVE mg/dL
Ketones, ur: NEGATIVE mg/dL
Nitrite: NEGATIVE
Protein, ur: NEGATIVE mg/dL
Specific Gravity, Urine: >1.03 — ABNORMAL HIGH (ref 1.005–1.030)
Urobilinogen, UA: 1 mg/dL (ref 0.0–1.0)
pH: 6 (ref 5.0–8.0)

## 2013-06-24 LAB — POC URINE PREG, ED: Preg Test, Ur: NEGATIVE

## 2013-06-24 MED ORDER — POTASSIUM CHLORIDE CRYS ER 20 MEQ PO TBCR
40.0000 meq | EXTENDED_RELEASE_TABLET | Freq: Once | ORAL | Status: AC
  Administered 2013-06-24: 40 meq via ORAL
  Filled 2013-06-24: qty 2

## 2013-06-24 MED ORDER — SODIUM CHLORIDE 0.9 % IV BOLUS (SEPSIS)
1000.0000 mL | Freq: Once | INTRAVENOUS | Status: AC
  Administered 2013-06-24: 1000 mL via INTRAVENOUS

## 2013-06-24 MED ORDER — DICYCLOMINE HCL 10 MG PO CAPS
10.0000 mg | ORAL_CAPSULE | Freq: Once | ORAL | Status: AC
  Administered 2013-06-24: 10 mg via ORAL
  Filled 2013-06-24: qty 1

## 2013-06-24 MED ORDER — ONDANSETRON 8 MG PO TBDP
8.0000 mg | ORAL_TABLET | Freq: Once | ORAL | Status: AC
  Administered 2013-06-24: 8 mg via ORAL
  Filled 2013-06-24: qty 1

## 2013-06-24 NOTE — ED Notes (Signed)
Complain of abdominal pain and greenish diarrhea that started yesterday

## 2013-06-24 NOTE — ED Provider Notes (Signed)
CSN: [id number]     Arrival date & time 06/24/13  1421 History   First MD Initiated Contact with Patient 06/24/13 1428     Chief Complaint  Patient presents with  . Abdominal Pain     (Consider location/radiation/quality/duration/timing/severity/associated sxs/prior Treatment) HPI Comments: Norma Nelson is a 28 y.o. Female presenting with complaint of non bloody green colored diarrhea since yesterday.  She reports too numerous to count episodes of diarrhea most of yesterday along with abdominal cramping (which persists) but the diarrhea has resolved since taking pepto bismol late last night.  She denies vomiting, but her stomach feels queasy.  She denies fevers or chills.  She has not taken any PO's today out of fear her diarrhea would return.  She feels generally weak, denies dizziness and headache.     The history is provided by the patient and a friend.    Past Medical History  Diagnosis Date  . Hypertension   . Bronchitis    History reviewed. No pertinent past surgical history. No family history on file. History  Substance Use Topics  . Smoking status: Current Every Day Smoker    Types: Cigarettes  . Smokeless tobacco: Not on file  . Alcohol Use: Yes     Comment: occ   OB History   Grav Para Term Preterm Abortions TAB SAB Ect Mult Living                 Review of Systems  Constitutional: Positive for appetite change. Negative for fever.  HENT: Negative for congestion and sore throat.   Eyes: Negative.   Respiratory: Negative for chest tightness and shortness of breath.   Cardiovascular: Negative for chest pain.  Gastrointestinal: Positive for nausea and diarrhea. Negative for vomiting and abdominal pain.  Genitourinary: Negative.   Musculoskeletal: Negative for arthralgias, joint swelling and neck pain.  Skin: Negative.  Negative for rash and wound.  Neurological: Positive for weakness. Negative for dizziness, light-headedness, numbness and headaches.   Psychiatric/Behavioral: Negative.       Allergies  Hydrocodone; Naproxen; and Tramadol  Home Medications   Current Outpatient Rx  Name  Route  Sig  Dispense  Refill  . amitriptyline (ELAVIL) 10 MG tablet   Oral   Take 10 mg by mouth daily.          BP 162/98  Pulse 88  Temp(Src) 98.5 F (36.9 C) (Oral)  Resp 18  Ht 5\' 9"  (1.753 m)  Wt 294 lb (133.358 kg)  BMI 43.40 kg/m2  SpO2 94% Physical Exam  Nursing note and vitals reviewed. Constitutional: She appears well-developed and well-nourished.  HENT:  Head: Normocephalic and atraumatic.  Eyes: Conjunctivae are normal.  Neck: Normal range of motion.  Cardiovascular: Normal rate, regular rhythm, normal heart sounds and intact distal pulses.   Pulmonary/Chest: Effort normal and breath sounds normal. She has no wheezes.  Abdominal: Soft. Bowel sounds are normal. She exhibits no distension. There is generalized tenderness. There is no rigidity and no guarding.  Musculoskeletal: Normal range of motion.  Neurological: She is alert.  Skin: Skin is warm and dry.  Psychiatric: She has a normal mood and affect.    ED Course  Procedures (including critical care time) Labs Review Labs Reviewed  BASIC METABOLIC PANEL - Abnormal; Notable for the following:    Potassium 3.3 (*)    All other components within normal limits  CBC WITH DIFFERENTIAL - Abnormal; Notable for the following:    WBC 13.2 (*)  Neutro Abs 9.4 (*)    Monocytes Absolute 1.5 (*)    All other components within normal limits  URINALYSIS, ROUTINE W REFLEX MICROSCOPIC - Abnormal; Notable for the following:    Specific Gravity, Urine >1.030 (*)    Hgb urine dipstick SMALL (*)    Leukocytes, UA TRACE (*)    All other components within normal limits  URINE MICROSCOPIC-ADD ON - Abnormal; Notable for the following:    Bacteria, UA FEW (*)    Crystals URIC ACID CRYSTALS (*)    All other components within normal limits  POC URINE PREG, ED   Imaging  Review No results found.   EKG Interpretation None      MDM   Final diagnoses:  Viral enteritis  Hypokalemia, gastrointestinal losses    Pt was given NS 1 liter per IV,  Zofran, bentyl given.  Tolerated PO fluids,  No diarrhea while in ed.   Sx c/w viral enteritis.  She [place] [employer] [place], but friend who ate with her without sx.  Was concerned about possible food poisoning.  She has been exposed to a child with recent diarrhea.  She was given potassium 40 meq supplement.  Felt better at time of dc. Encouraged b.r.a.t. Diet for the next day,  Then increase as tolerated.    Burgess AmorJulie Satara Virella, PA-C 06/24/13 1659

## 2013-06-24 NOTE — Care Management Note (Signed)
ED/CM noted patient did not have health insurance and/or PCP listed in the computer.  Patient was given the [place] resource handout with information on the clinics, food pantries, and the handout for new health insurance sign-up.  Patient expressed appreciation for information received. 

## 2013-06-24 NOTE — Discharge Instructions (Signed)
Viral Gastroenteritis Viral gastroenteritis is also known as stomach flu. This condition affects the stomach and intestinal tract. It can cause sudden diarrhea and vomiting. The illness typically lasts 3 to 8 days. Most people develop an immune response that eventually gets rid of the virus. While this natural response develops, the virus can make you quite ill. CAUSES  Many different viruses can cause gastroenteritis, such as rotavirus or noroviruses. You can catch one of these viruses by consuming contaminated food or water. You may also catch a virus by sharing utensils or other personal items with an infected person or by touching a contaminated surface. SYMPTOMS  The most common symptoms are diarrhea and vomiting. These problems can cause a severe loss of body fluids (dehydration) and a body salt (electrolyte) imbalance. Other symptoms may include:  Fever.  Headache.  Fatigue.  Abdominal pain. DIAGNOSIS  Your caregiver can usually diagnose viral gastroenteritis based on your symptoms and a physical exam. A stool sample may also be taken to test for the presence of viruses or other infections. TREATMENT  This illness typically goes away on its own. Treatments are aimed at rehydration. The most serious cases of viral gastroenteritis involve vomiting so severely that you are not able to keep fluids down. In these cases, fluids must be given through an intravenous line (IV). HOME CARE INSTRUCTIONS   Drink enough fluids to keep your urine clear or pale yellow. Drink small amounts of fluids frequently and increase the amounts as tolerated.  Ask your caregiver for specific rehydration instructions.  Avoid:  Foods high in sugar.  Alcohol.  Carbonated drinks.  Tobacco.  Juice.  Caffeine drinks.  Extremely hot or cold fluids.  Fatty, greasy foods.  Too much intake of anything at one time.  Dairy products until 24 to 48 hours after diarrhea stops.  You may consume probiotics.  Probiotics are active cultures of beneficial bacteria. They may lessen the amount and number of diarrheal stools in adults. Probiotics can be found in yogurt with active cultures and in supplements.  Wash your hands well to avoid spreading the virus.  Only take over-the-counter or prescription medicines for pain, discomfort, or fever as directed by your caregiver. Do not give aspirin to children. Antidiarrheal medicines are not recommended.  Ask your caregiver if you should continue to take your regular prescribed and over-the-counter medicines.  Keep all follow-up appointments as directed by your caregiver.  Bananas, rice, applesauce and dry toast are especially good foods for helping to recover from diarrhea. SEEK IMMEDIATE MEDICAL CARE IF:   You are unable to keep fluids down.  You do not urinate at least once every 6 to 8 hours.  You develop shortness of breath.  You notice blood in your stool or vomit. This may look like coffee grounds.  You have abdominal pain that increases or is concentrated in one small area (localized).  You have persistent vomiting or diarrhea.  You have a fever.  The patient is a child younger than 3 months, and he or she has a fever.  The patient is a child older than 3 months, and he or she has a fever and persistent symptoms.  The patient is a child older than 3 months, and he or she has a fever and symptoms suddenly get worse.  The patient is a baby, and he or she has no tears when crying. MAKE SURE YOU:   Understand these instructions.  Will watch your condition.  Will get help right away if  you are not doing well or get worse. Document Released: 04/08/2005 Document Revised: 07/01/2011 Document Reviewed: 01/23/2011 [facility] Patient Information 2014 [place], [place].

## 2013-06-24 NOTE — ED Provider Notes (Signed)
Medical screening examination/treatment/procedure(s) were performed by non-physician practitioner and as supervising physician I was immediately available for consultation/collaboration.   EKG Interpretation None       Taha Dimond R. Zendayah Hardgrave, MD 06/24/13 1803 

## 2013-10-05 DIAGNOSIS — Z79899 Other long term (current) drug therapy: Secondary | ICD-10-CM | POA: Insufficient documentation

## 2013-10-05 DIAGNOSIS — I10 Essential (primary) hypertension: Secondary | ICD-10-CM | POA: Insufficient documentation

## 2013-10-05 DIAGNOSIS — Z8709 Personal history of other diseases of the respiratory system: Secondary | ICD-10-CM | POA: Insufficient documentation

## 2013-10-05 DIAGNOSIS — R0789 Other chest pain: Secondary | ICD-10-CM | POA: Insufficient documentation

## 2013-10-05 DIAGNOSIS — F17200 Nicotine dependence, unspecified, uncomplicated: Secondary | ICD-10-CM | POA: Insufficient documentation

## 2013-10-06 ENCOUNTER — Emergency Department (HOSPITAL_COMMUNITY)
Admission: EM | Admit: 2013-10-06 | Discharge: 2013-10-06 | Disposition: A | Discharge status: Home / Self Care | Payer: Self-pay | Attending: Emergency Medicine | Admitting: Emergency Medicine

## 2013-10-06 ENCOUNTER — Encounter (HOSPITAL_COMMUNITY): Payer: Self-pay | Admitting: Emergency Medicine

## 2013-10-06 ENCOUNTER — Emergency Department (HOSPITAL_COMMUNITY): Payer: Self-pay

## 2013-10-06 DIAGNOSIS — R079 Chest pain, unspecified: Secondary | ICD-10-CM

## 2013-10-06 LAB — BASIC METABOLIC PANEL
BUN: 8 mg/dL (ref 6–23)
CO2: 23 mEq/L (ref 19–32)
Calcium: 9.7 mg/dL (ref 8.4–10.5)
Chloride: 107 mEq/L (ref 96–112)
Creatinine, Ser: 0.84 mg/dL (ref 0.50–1.10)
GFR calc Af Amer: >90 mL/min (ref >90)
GFR calc non Af Amer: >90 mL/min (ref >90)
Glucose, Bld: 126 mg/dL — ABNORMAL HIGH (ref 70–99)
Potassium: 3.8 mEq/L (ref 3.7–5.3)
Sodium: 142 mEq/L (ref 137–147)

## 2013-10-06 LAB — TROPONIN I: Troponin I: <0.3 ng/mL (ref <0.30)

## 2013-10-06 LAB — CBC
HCT: 38.2 % (ref 36.0–46.0)
Hemoglobin: 12.9 g/dL (ref 12.0–15.0)
MCH: 27.6 pg (ref 26.0–34.0)
MCHC: 33.8 g/dL (ref 30.0–36.0)
MCV: 81.8 fL (ref 78.0–100.0)
Platelets: 413 10*3/uL — ABNORMAL HIGH (ref 150–400)
RBC: 4.67 MIL/uL (ref 3.87–5.11)
RDW: 14.4 % (ref 11.5–15.5)
WBC: 19.1 10*3/uL — ABNORMAL HIGH (ref 4.0–10.5)

## 2013-10-06 MED ORDER — GI COCKTAIL ~~LOC~~
30.0000 mL | Freq: Once | ORAL | Status: AC
  Administered 2013-10-06: 30 mL via ORAL
  Filled 2013-10-06: qty 30

## 2013-10-06 MED ORDER — OMEPRAZOLE 20 MG PO CPDR: 20.0000 mg | DELAYED_RELEASE_CAPSULE | Freq: Every day | ORAL | Status: DC

## 2013-10-06 NOTE — ED Provider Notes (Signed)
CSN: [id number]     Arrival date & time 10/05/13  2356 History  This chart was scribed for Norma CoKevin M Campos, MD by Nicholos Johnsenise Iheanachor, ED scribe. This patient was seen in room APA11/APA11 and the patient's care was started at 12:30 AM.    Chief Complaint  Patient presents with  . Chest Pain    The history is provided by the patient. No language interpreter was used.   HPI Comments: Norma Nelson is a [age] y.o. female w/ hx of diabetes presents to the Emergency Department complaining of sudden onset stabbing chest pain; onset approximately 30 minutes ago. ays she was just walking across the street and describes sensation of knives in her chest. Sat down and says pain did not go away. Pain worse with ambulation. Relief when doubled over but states pain was still present. Felt like her heart was racing; has since resolved. No other sx. No recent travel or long car rides. Family hx of heart disease. No recent family hx of blood clot. Smokes cigarettes. Denies any illicit drug use. Denies SOB, fever, chills, cough, congestion, or leg swelling  Past Medical History  Diagnosis Date  . Hypertension   . Bronchitis    History reviewed. No pertinent past surgical history. No family history on file. History  Substance Use Topics  . Smoking status: Current Every Day Smoker    Types: Cigarettes  . Smokeless tobacco: Not on file  . Alcohol Use: Yes     Comment: occ   OB History   Grav Para Term Preterm Abortions TAB SAB Ect Mult Living                 Review of Systems  A complete 10 system review of systems was obtained and all systems are negative except as noted in the HPI and PMH.    Allergies  Hydrocodone; Naproxen; and Tramadol  Home Medications   Prior to Admission medications   Medication Sig Start Date End Date Taking? Authorizing Provider  amitriptyline (ELAVIL) 10 MG tablet Take 10 mg by mouth daily.    Historical Provider, MD  omeprazole (PRILOSEC) 20 MG capsule Take 1 capsule  (20 mg total) by mouth daily. 10/06/13   Norma CoKevin M Campos, MD   Triage vitals: BP 181/109  Pulse 91  Temp(Src) 98.6 F (37 C) (Oral)  Resp 22  Ht 5\' 10"  (1.778 m)  SpO2 98% Physical Exam  Nursing note and vitals reviewed. Constitutional: She is oriented to person, place, and time. She appears well-developed and well-nourished. No distress.  HENT:  Head: Normocephalic and atraumatic.  Eyes: EOM are normal.  Neck: Normal range of motion.  Cardiovascular: Normal rate, regular rhythm and normal heart sounds.   Pulmonary/Chest: Effort normal and breath sounds normal.  Abdominal: Soft. She exhibits no distension. There is no tenderness.  Musculoskeletal: Normal range of motion.  Neurological: She is alert and oriented to person, place, and time.  Skin: Skin is warm and dry.  Psychiatric: She has a normal mood and affect. Judgment normal.    ED Course  Procedures (including critical care time) DIAGNOSTIC STUDIES: Oxygen Saturation is 98% on room air, normal by my interpretation.    COORDINATION OF CARE: At 12:34 AM: Discussed treatment plan with patient which includes blood work. Patient agrees.    Labs Review Labs Reviewed  CBC - Abnormal; Notable for the following:    WBC 19.1 (*)    Platelets 413 (*)    All other components within normal  limits  BASIC METABOLIC PANEL - Abnormal; Notable for the following:    Glucose, Bld 126 (*)    All other components within normal limits  TROPONIN I    Imaging Review Dg Chest 2 View  10/06/2013   CLINICAL DATA:  Chest pain for 1 hr.  Asthma.  EXAM: CHEST  2 VIEW  COMPARISON:  Chest radiograph May 01, 2012  FINDINGS: Cardiomediastinal silhouette is unremarkable. Mildly elevated right hemidiaphragm. The lungs are clear without pleural effusions or focal consolidations. Trachea projects midline and there is no pneumothorax. Soft tissue planes and included osseous structures are non-suspicious.  Multiple EKG lines overlie the patient and may  obscure subtle underlying pathology.  IMPRESSION: No active cardiopulmonary disease.   Electronically Signed   By: Awilda Metroourtnay  Bloomer   On: 10/06/2013 01:06     EKG Interpretation   Date/Time:  Wednesday October 06 2013 00:14:40 EDT Ventricular Rate:  98 PR Interval:  163 QRS Duration: 87 QT Interval:  357 QTC Calculation: 456 R Axis:   37 Text Interpretation:  Sinus rhythm No significant change was found  Confirmed by CAMPOS  MD, KEVIN ([id number]) on 10/06/2013 12:36:26 AM      MDM   Final diagnoses:  Chest pain   Patient is overall well-appearing.  Suspect gastroesophageal reflux disease.  Patient is low risk wells criteria and  PERC negative.  Discharge home with Prilosec.  Doubt ACS.   I personally performed the services described in this documentation, which was scribed in my presence. The recorded information has been reviewed and is accurate.     Norma CoKevin M Campos, MD 10/06/13 [phone]

## 2013-10-06 NOTE — Discharge Instructions (Signed)

## 2013-10-06 NOTE — ED Notes (Signed)
Pt reports having stabbing chest pain walking across parking lot. States "I can feel like something is just sitting there".

## 2013-10-25 ENCOUNTER — Emergency Department (HOSPITAL_COMMUNITY)
Admission: EM | Admit: 2013-10-25 | Discharge: 2013-10-25 | Disposition: A | Discharge status: Home / Self Care | Payer: Self-pay | Attending: Emergency Medicine | Admitting: Emergency Medicine

## 2013-10-25 ENCOUNTER — Encounter (HOSPITAL_COMMUNITY): Payer: Self-pay | Admitting: Emergency Medicine

## 2013-10-25 DIAGNOSIS — L259 Unspecified contact dermatitis, unspecified cause: Secondary | ICD-10-CM | POA: Insufficient documentation

## 2013-10-25 DIAGNOSIS — Z79899 Other long term (current) drug therapy: Secondary | ICD-10-CM | POA: Insufficient documentation

## 2013-10-25 DIAGNOSIS — Z8709 Personal history of other diseases of the respiratory system: Secondary | ICD-10-CM | POA: Insufficient documentation

## 2013-10-25 DIAGNOSIS — F17200 Nicotine dependence, unspecified, uncomplicated: Secondary | ICD-10-CM | POA: Insufficient documentation

## 2013-10-25 DIAGNOSIS — I10 Essential (primary) hypertension: Secondary | ICD-10-CM | POA: Insufficient documentation

## 2013-10-25 DIAGNOSIS — L309 Dermatitis, unspecified: Secondary | ICD-10-CM

## 2013-10-25 MED ORDER — TRIAMCINOLONE ACETONIDE 0.5 % EX OINT: 1.0000 "application " | TOPICAL_OINTMENT | Freq: Three times a day (TID) | CUTANEOUS | Status: DC

## 2013-10-25 MED ORDER — TRIAMCINOLONE ACETONIDE 0.1 % EX CREA
1.0000 | TOPICAL_CREAM | Freq: Two times a day (BID) | CUTANEOUS | Status: DC
Start: 2013-10-25 — End: 2013-10-25

## 2013-10-25 MED ORDER — METHYLPREDNISOLONE SODIUM SUCC 125 MG IJ SOLR
80.0000 mg | Freq: Once | INTRAMUSCULAR | Status: AC
  Administered 2013-10-25: 80 mg via INTRAMUSCULAR
  Filled 2013-10-25: qty 2

## 2013-10-25 MED ORDER — METHYLPREDNISOLONE SODIUM SUCC 125 MG IJ SOLR: 80.0000 mg | Freq: Once | INTRAMUSCULAR | Status: DC

## 2013-10-25 MED ORDER — TRIAMCINOLONE ACETONIDE 0.05 % EX OINT: TOPICAL_OINTMENT | CUTANEOUS | Status: DC

## 2013-10-25 NOTE — ED Notes (Signed)
Rash for 1 week, usually uses medication for rash, but is out of it

## 2013-10-25 NOTE — ED Provider Notes (Signed)
CSN: [id number]     Arrival date & time 10/25/13  1259 History   First MD Initiated Contact with Patient 10/25/13 1330     This chart was scribed for nurse practitioner working with Donnetta HutchingBrian Cook, MD, by Andrew Auaven Small, ED Scribe. This patient was seen in room APFT20/APFT20 and the patient's care was started at 1:35 PM. Chief Complaint  Patient presents with  . Rash   Patient is a 28 y.o. female presenting with rash. The history is provided by the patient. No language interpreter was used.  Rash Location:  Shoulder/arm, leg, face and head/neck Facial rash location:  Face Shoulder/arm rash location:  L arm and R arm Leg rash location:  L leg and R leg Quality: dryness, itchiness and scaling   Severity:  Moderate Onset quality:  Gradual Duration:  2 weeks Timing:  Constant Progression:  Worsening Chronicity:  Recurrent Relieved by:  None tried Worsened by:  Ezzard StandingHeat  Norma Nelson is a 28 y.o. female who presents to the Emergency Department complaining of rash. Pt states she is has eczema. She reports she has been out of her triamcinolone ointment for 2 weeks. She uses ointment 3 times a day. She reports when she runs out of medication she begins to flare up. She denies using OTC medication with ointment. She reports eczema worsens during the summer. She denies SOB and CP. Pt is from out of town and does not have PCP in town.  Past Medical History  Diagnosis Date  . Hypertension   . Bronchitis    History reviewed. No pertinent past surgical history. History reviewed. No pertinent family history. History  Substance Use Topics  . Smoking status: Current Every Day Smoker    Types: Cigarettes  . Smokeless tobacco: Not on file  . Alcohol Use: Yes     Comment: occ   OB History   Grav Para Term Preterm Abortions TAB SAB Ect Mult Living                 Review of Systems  Skin: Positive for rash. Negative for wound.  All other systems negative  Allergies  Hydrocodone; Naproxen; and  Tramadol  Home Medications   Prior to Admission medications   Medication Sig Start Date End Date Taking? Authorizing Provider  amitriptyline (ELAVIL) 10 MG tablet Take 10 mg by mouth daily.    Historical Provider, MD  omeprazole (PRILOSEC) 20 MG capsule Take 1 capsule (20 mg total) by mouth daily. 10/06/13   Lyanne CoKevin M Campos, MD   BP 147/90  Pulse 89  Temp(Src) 98.4 F (36.9 C) (Oral)  Resp 18  Ht 5\' 11"  (1.803 m)  Wt 209 lb (94.802 kg)  BMI 29.16 kg/m2  SpO2 97% Physical Exam  Nursing note and vitals reviewed. Constitutional: She is oriented to person, place, and time. She appears well-developed and well-nourished. No distress.  HENT:  Head: Normocephalic and atraumatic.  Right Ear: External ear normal.  Left Ear: External ear normal.  Eyes: Conjunctivae and EOM are normal. Pupils are equal, round, and reactive to light.  Neck: Normal range of motion. Neck supple.  Cardiovascular: Normal rate, regular rhythm and normal heart sounds.   Pulmonary/Chest: Effort normal and breath sounds normal.  Musculoskeletal: Normal range of motion.  Neurological: She is alert and oriented to person, place, and time.  Skin: Skin is warm and dry.  Dry,scaly,  patches located to face, ears, neck, bilateral arms and legs  Psychiatric: She has a normal mood and affect.  Her behavior is normal.    ED Course  Procedures  DIAGNOSTIC STUDIES: Oxygen Saturation is 97% on RA, normal by my interpretation.    COORDINATION OF CARE: 1:42 PM-Discussed treatment plan which includes  triamcinolone ointment with pt at bedside and pt agreed to plan.    MDM  28 y.o. female with flare up of eczema since running out of her medication 2 weeks ago. Will refill her medication. She will try and become established with a PCP here in town. Gave patient name of dermatologist to follow up with if symptoms worsen.  Discussed with the patient and all questioned fully answered. She voices understanding.   I personally  performed the services described in this documentation, which was scribed in my presence. The recorded information has been reviewed and is accurate.   Medication List    TAKE these medications       TRIAMCINOLONE ACETONIDE (TOP) 0.05 % Oint  Apply to affected areas BID      ASK your doctor about these medications       amitriptyline 10 MG tablet  Commonly known as:  ELAVIL  Take 10 mg by mouth daily.     omeprazole 20 MG capsule  Commonly known as:  PRILOSEC  Take 1 capsule (20 mg total) by mouth daily.            [facility] Orlene OchM Neese, [place]NP 10/25/13 [phone]

## 2013-10-26 NOTE — ED Provider Notes (Signed)
Medical screening examination/treatment/procedure(s) were performed by non-physician practitioner and as supervising physician I was immediately available for consultation/collaboration.   EKG Interpretation None       Donnetta HutchingBrian Graylee Arutyunyan, MD 10/26/13 [phone]

## 2013-11-07 ENCOUNTER — Emergency Department (HOSPITAL_COMMUNITY)
Admission: EM | Admit: 2013-11-07 | Discharge: 2013-11-08 | Disposition: A | Discharge status: Home / Self Care | Payer: Self-pay | Attending: Emergency Medicine | Admitting: Emergency Medicine

## 2013-11-07 ENCOUNTER — Encounter (HOSPITAL_COMMUNITY): Payer: Self-pay | Admitting: Emergency Medicine

## 2013-11-07 DIAGNOSIS — Z79899 Other long term (current) drug therapy: Secondary | ICD-10-CM | POA: Insufficient documentation

## 2013-11-07 DIAGNOSIS — I10 Essential (primary) hypertension: Secondary | ICD-10-CM | POA: Insufficient documentation

## 2013-11-07 DIAGNOSIS — Z8709 Personal history of other diseases of the respiratory system: Secondary | ICD-10-CM | POA: Insufficient documentation

## 2013-11-07 DIAGNOSIS — F17200 Nicotine dependence, unspecified, uncomplicated: Secondary | ICD-10-CM | POA: Insufficient documentation

## 2013-11-07 DIAGNOSIS — N750 Cyst of Bartholin's gland: Secondary | ICD-10-CM | POA: Insufficient documentation

## 2013-11-07 DIAGNOSIS — N758 Other diseases of Bartholin's gland: Secondary | ICD-10-CM

## 2013-11-07 MED ORDER — LIDOCAINE HCL (PF) 1 % IJ SOLN
INTRAMUSCULAR | Status: AC
  Administered 2013-11-07
  Filled 2013-11-07: qty 5

## 2013-11-07 NOTE — ED Notes (Signed)
Pt has abscess in vaginal area, x 2 days.

## 2013-11-08 MED ORDER — OXYCODONE-ACETAMINOPHEN 5-325 MG PO TABS
1.0000 | ORAL_TABLET | Freq: Once | ORAL | Status: AC
  Administered 2013-11-08: 1 via ORAL
  Filled 2013-11-08: qty 1

## 2013-11-08 MED ORDER — OXYCODONE-ACETAMINOPHEN 5-325 MG PO TABS: 1.0000 | ORAL_TABLET | ORAL | Status: DC | PRN

## 2013-11-08 MED ORDER — DOXYCYCLINE HYCLATE 100 MG PO TABS: 100.0000 mg | ORAL_TABLET | Freq: Once | ORAL | Status: DC

## 2013-11-08 NOTE — Discharge Instructions (Signed)
Bartholin's Cyst or Abscess Bartholin's glands are small glands located within the folds of skin (labia) along the sides of the lower opening of the vagina (birth canal). A cyst may develop when the duct of the gland becomes blocked. When this happens, fluid that accumulates within the cyst can become infected. This is known as an abscess. The Bartholin gland produces a mucous fluid to lubricate the outside of the vagina during sexual intercourse. SYMPTOMS   Patients with a small cyst may not have any symptoms.  Mild discomfort to severe pain depending on the size of the cyst and if it is infected (abscess).  Pain, redness, and swelling around the lower opening of the vagina.  Painful intercourse.  Pressure in the perineal area.  Swelling of the lips of the vagina (labia).  The cyst or abscess can be on one side or both sides of the vagina. DIAGNOSIS   A large swelling is seen in the lower vagina area by your caregiver.  Painful to touch.  Redness and pain, if it is an abscess. TREATMENT   Sometimes the cyst will go away on its own.  Apply warm wet compresses to the area or take hot sitz baths several times a day.  An incision to drain the cyst or abscess with local anesthesia.  Culture the pus, if it is an abscess.  An antibiotic is not indicated if the abscess has been opened and drained.  Cut open the gland and suture the edges to make the opening of the gland bigger (marsupialization).  Remove the whole gland if the cyst or abscess returns. PREVENTION   Practice good hygiene.  Clean the vaginal area with a mild soap and soft cloth when bathing.  Do not rub hard in the vaginal area when bathing.  Protect the crotch area with a padded cushion if you take long bike rides or ride horses.  Be sure you are well lubricated when you have sexual intercourse. HOME CARE INSTRUCTIONS   If your cyst or abscess was opened, a small piece of gauze, or a drain, may have been  placed in the wound to allow drainage. Do not remove this gauze or drain unless directed by your caregiver.  Wear feminine pads, not tampons, as needed for any drainage or bleeding.  If antibiotics were prescribed, take them exactly as directed. Finish the entire course.  Only take over-the-counter or prescription medicines for pain, discomfort, or fever as directed by your caregiver. SEEK IMMEDIATE MEDICAL CARE IF:   You have an increase in pain, redness, swelling, or drainage.  You have bleeding from the wound which results in the use of more than the number of pads suggested by your caregiver in 24 hours.  You have chills.  You have a fever.  You develop any new problems (symptoms) or aggravation of your existing condition. MAKE SURE YOU:   Understand these instructions.  Will watch your condition.  Will get help right away if you are not doing well or get worse. Document Released: 04/08/2005 Document Revised: 07/01/2011 Document Reviewed: 11/25/2007 [facility] Patient Information 2015 [place]. This information is not intended to replace advice given to you by your health care provider. Make sure you discuss any questions you have with your health care provider.

## 2013-11-10 NOTE — ED Provider Notes (Signed)
CSN: [id number]     Arrival date & time 11/07/13  2236 History   First MD Initiated Contact with Patient 11/07/13 2309     Chief Complaint  Patient presents with  . Abscess     (Consider location/radiation/quality/duration/timing/severity/associated sxs/prior Treatment) Patient is a 28 y.o. female presenting with abscess. The history is provided by the patient and a friend.  Abscess Location:  Ano-genital Ano-genital abscess location:  Perineum Size:  1 Abscess quality: induration, painful and redness   Abscess quality: not draining   Red streaking: no   Duration:  2 days Progression:  Worsening Pain details:    Quality:  Sharp and throbbing   Severity:  Severe   Duration:  2 days   Timing:  Constant   Progression:  Worsening Chronicity:  Recurrent Context: not diabetes   Relieved by:  Nothing Worsened by:  Nothing tried Ineffective treatments:  Warm compresses Associated symptoms: no fever   Risk factors: prior abscess   Risk factors: no hx of MRSA     Past Medical History  Diagnosis Date  . Hypertension   . Bronchitis    History reviewed. No pertinent past surgical history. History reviewed. No pertinent family history. History  Substance Use Topics  . Smoking status: Current Every Day Smoker    Types: Cigarettes  . Smokeless tobacco: Not on file  . Alcohol Use: Yes     Comment: occ   OB History   Grav Para Term Preterm Abortions TAB SAB Ect Mult Living                 Review of Systems  Constitutional: Negative for fever and chills.  Respiratory: Negative.   Skin: Negative for rash.  Neurological: Negative for numbness.      Allergies  Hydrocodone; Naproxen; and Tramadol  Home Medications   Prior to Admission medications   Medication Sig Start Date End Date Taking? Authorizing Provider  amitriptyline (ELAVIL) 10 MG tablet Take 10 mg by mouth daily.    Historical Provider, MD  omeprazole (PRILOSEC) 20 MG capsule Take 1 capsule (20 mg total)  by mouth daily. 10/06/13   Lyanne Co, MD  oxyCODONE-acetaminophen (PERCOCET/ROXICET) 5-325 MG per tablet Take 1 tablet by mouth every 4 (four) hours as needed. 11/08/13   Burgess Amor, PA-C  TRIAMCINOLONE ACETONIDE, TOP, 0.05 % OINT Apply to affected areas BID 10/25/13   Hope Orlene Och, NP   BP 158/88  Pulse 101  Temp(Src) 99 F (37.2 C) (Oral)  Resp 22  Ht 5\' 11"  (1.803 m)  Wt 300 lb (136.079 kg)  BMI 41.86 kg/m2  SpO2 99% Physical Exam  Constitutional: She appears well-developed and well-nourished. No distress.  HENT:  Head: Normocephalic.  Neck: Neck supple.  Cardiovascular: Normal rate.   Pulmonary/Chest: Effort normal. She has no wheezes.  Genitourinary:    There is tenderness on the right labia.  Small, fluctuant non draining abscess at right bartholins gland.  Musculoskeletal: Normal range of motion. She exhibits no edema.  Skin: There is erythema.    ED Course  INCISION AND DRAINAGE Date/Time: 11/07/2013 11:45 PM Performed by: Burgess Amor Authorized by: Burgess Amor Risks and benefits: risks, benefits and alternatives were discussed Consent given by: patient Patient identity confirmed: verbally with patient Time out: Immediately prior to procedure a "time out" was called to verify the correct patient, procedure, equipment, support staff and site/side marked as required. Type: abscess Body area: anogenital Location details: Bartholin's gland Anesthesia: local infiltration Local anesthetic: lidocaine  1% without epinephrine Anesthetic total: 4 ml Scalpel size: 11 Incision type: single straight Complexity: simple Drainage: bloody (trace of purulence drained) Wound treatment: wound left open Packing material: none Patient tolerance: Patient tolerated the procedure well with no immediate complications.   (including critical care time) Labs Review Labs Reviewed - No data to display  Imaging Review No results found.   EKG Interpretation None      MDM    Final diagnoses:  Bartholin's gland infection    bartholins gland infection/abscess with scant purulence with I & D.  Pt advised to continue warm soaks to continue drainage.  No surrounding erythema, no indication for abx.  Pt tolerated procedure well.  She was prescribed oxycodone.  Referred to pcp for any problems.  [facility]    The patient appears reasonably screened and/or stabilized for discharge and I doubt any other medical condition or other [facility] requiring further screening, evaluation, or treatment in the ED at this time prior to discharge.     Burgess AmorJulie Ilyssa Grennan, PA-C 11/10/13 2301

## 2013-11-11 NOTE — ED Provider Notes (Signed)
Medical screening examination/treatment/procedure(s) were performed by non-physician practitioner and as supervising physician I was immediately available for consultation/collaboration.    Vida RollerBrian D Josel Keo, MD 11/11/13 [phone]

## 2013-12-20 ENCOUNTER — Encounter (HOSPITAL_COMMUNITY): Payer: Self-pay | Admitting: Emergency Medicine

## 2013-12-20 ENCOUNTER — Emergency Department (HOSPITAL_COMMUNITY)
Admission: EM | Admit: 2013-12-20 | Discharge: 2013-12-20 | Disposition: A | Discharge status: Home / Self Care | Payer: Self-pay | Attending: Emergency Medicine | Admitting: Emergency Medicine

## 2013-12-20 DIAGNOSIS — Z79899 Other long term (current) drug therapy: Secondary | ICD-10-CM | POA: Insufficient documentation

## 2013-12-20 DIAGNOSIS — J40 Bronchitis, not specified as acute or chronic: Secondary | ICD-10-CM | POA: Insufficient documentation

## 2013-12-20 DIAGNOSIS — F17200 Nicotine dependence, unspecified, uncomplicated: Secondary | ICD-10-CM | POA: Insufficient documentation

## 2013-12-20 DIAGNOSIS — I10 Essential (primary) hypertension: Secondary | ICD-10-CM | POA: Insufficient documentation

## 2013-12-20 DIAGNOSIS — Z3202 Encounter for pregnancy test, result negative: Secondary | ICD-10-CM | POA: Insufficient documentation

## 2013-12-20 LAB — URINALYSIS, ROUTINE W REFLEX MICROSCOPIC
Glucose, UA: NEGATIVE mg/dL
Ketones, ur: NEGATIVE mg/dL
Nitrite: NEGATIVE
Protein, ur: 30 mg/dL — AB
Specific Gravity, Urine: 1.025 (ref 1.005–1.030)
Urobilinogen, UA: 0.2 mg/dL (ref 0.0–1.0)
pH: 6 (ref 5.0–8.0)

## 2013-12-20 LAB — TROPONIN I: Troponin I: <0.3 ng/mL (ref <0.30)

## 2013-12-20 LAB — BASIC METABOLIC PANEL
Anion gap: 14 (ref 5–15)
BUN: 11 mg/dL (ref 6–23)
CO2: 24 mEq/L (ref 19–32)
Calcium: 10.1 mg/dL (ref 8.4–10.5)
Chloride: 102 mEq/L (ref 96–112)
Creatinine, Ser: 0.83 mg/dL (ref 0.50–1.10)
GFR calc Af Amer: >90 mL/min (ref >90)
GFR calc non Af Amer: >90 mL/min (ref >90)
Glucose, Bld: 89 mg/dL (ref 70–99)
Potassium: 3.3 mEq/L — ABNORMAL LOW (ref 3.7–5.3)
Sodium: 140 mEq/L (ref 137–147)

## 2013-12-20 LAB — CBC WITH DIFFERENTIAL/PLATELET
Basophils Absolute: 0.1 10*3/uL (ref 0.0–0.1)
Basophils Relative: 1 % (ref 0–1)
Eosinophils Absolute: 0.4 10*3/uL (ref 0.0–0.7)
Eosinophils Relative: 2 % (ref 0–5)
HCT: 38 % (ref 36.0–46.0)
Hemoglobin: 13 g/dL (ref 12.0–15.0)
Lymphocytes Relative: 24 % (ref 12–46)
Lymphs Abs: 4.6 10*3/uL — ABNORMAL HIGH (ref 0.7–4.0)
MCH: 27.7 pg (ref 26.0–34.0)
MCHC: 34.2 g/dL (ref 30.0–36.0)
MCV: 81 fL (ref 78.0–100.0)
Monocytes Absolute: 2.4 10*3/uL — ABNORMAL HIGH (ref 0.1–1.0)
Monocytes Relative: 12 % (ref 3–12)
Neutro Abs: 12 10*3/uL — ABNORMAL HIGH (ref 1.7–7.7)
Neutrophils Relative %: 61 % (ref 43–77)
Platelets: 416 10*3/uL — ABNORMAL HIGH (ref 150–400)
RBC: 4.69 MIL/uL (ref 3.87–5.11)
RDW: 14.7 % (ref 11.5–15.5)
WBC: 19.5 10*3/uL — ABNORMAL HIGH (ref 4.0–10.5)

## 2013-12-20 LAB — POC URINE PREG, ED: Preg Test, Ur: NEGATIVE

## 2013-12-20 LAB — URINE MICROSCOPIC-ADD ON

## 2013-12-20 MED ORDER — POTASSIUM CHLORIDE CRYS ER 20 MEQ PO TBCR
40.0000 meq | EXTENDED_RELEASE_TABLET | Freq: Once | ORAL | Status: AC
  Administered 2013-12-20: 40 meq via ORAL
  Filled 2013-12-20: qty 2

## 2013-12-20 MED ORDER — ACETAMINOPHEN 500 MG PO TABS
1000.0000 mg | ORAL_TABLET | Freq: Once | ORAL | Status: AC
  Administered 2013-12-20: 1000 mg via ORAL
  Filled 2013-12-20: qty 2

## 2013-12-20 NOTE — Discharge Instructions (Signed)
Your white blood cell count is elevated at 19.5 but this is been present for you since 2012. This is something that can be monitored by your primary care physician and is not related to your high blood pressure.   You do have some protein and blood in your urine today but this is likely from your menstrual cycle. You should have your primary care physician recheck your urine when you are not menstruating.   Your potassium was only slightly low at 3.3 (normal is 3.5-4.0) but we have given you potassium in the emergency department to replace this. Your potassium can drop from your hydrochlorothiazide so this is something that the health Department needs to monitor closely.   Given your blood pressure and improved without any intervention and is in the 140s/70s, I do not feel this is safe for [place] to adjust your blood pressure medication - This is something that your primary doctor needs to do. I recommend you keep a log of your blood pressure 2-3 times a week when you were feeling well and after you have taken your blood pressure medications that you can present this to your primary care physician so they can decide at a later date if they need to adjust your blood pressure medication any further.   DASH Eating Plan DASH stands for "Dietary Approaches to Stop Hypertension." The DASH eating plan is a healthy eating plan that has been shown to reduce high blood pressure (hypertension). Additional health benefits may include reducing the risk of type 2 diabetes mellitus, heart disease, and stroke. The DASH eating plan may also help with weight loss. WHAT DO I NEED TO KNOW ABOUT THE DASH EATING PLAN? For the DASH eating plan, you will follow these general guidelines:  Choose foods with a percent daily value for sodium of less than 5% (as listed on the food label).  Use salt-free seasonings or herbs instead of table salt or sea salt.  Check with your health care provider or pharmacist before using salt  substitutes.  Eat lower-sodium products, often labeled as "lower sodium" or "no salt added."  Eat fresh foods.  Eat more vegetables, fruits, and low-fat dairy products.  Choose whole grains. Look for the word "whole" as the first word in the ingredient list.  Choose fish and skinless chicken or [place] more often than red meat. Limit fish, poultry, and meat to 6 oz (170 g) each day.  Limit sweets, desserts, sugars, and sugary drinks.  Choose heart-healthy fats.  Limit cheese to 1 oz (28 g) per day.  Eat more home-cooked food and less restaurant, buffet, and fast food.  Limit fried foods.  Cook foods using methods other than frying.  Limit canned vegetables. If you do use them, rinse them well to decrease the sodium.  When eating at a restaurant, ask that your food be prepared with less salt, or no salt if possible. WHAT FOODS CAN I EAT? Seek help from a dietitian for individual calorie needs. Grains Whole grain or whole wheat bread. Brown rice. Whole grain or whole wheat pasta. Quinoa, bulgur, and whole grain cereals. Low-sodium cereals. Corn or whole wheat flour tortillas. Whole grain cornbread. Whole grain crackers. Low-sodium crackers. Vegetables Fresh or frozen vegetables (raw, steamed, roasted, or grilled). Low-sodium or reduced-sodium tomato and vegetable juices. Low-sodium or reduced-sodium tomato sauce and paste. Low-sodium or reduced-sodium canned vegetables.  Fruits All fresh, canned (in natural juice), or frozen fruits. Meat and Other Protein Products Ground beef (85% or leaner), grass-fed beef,  or beef trimmed of fat. Skinless chicken or [place]. Ground chicken or [place]. Pork trimmed of fat. All fish and seafood. Eggs. Dried beans, peas, or lentils. Unsalted nuts and seeds. Unsalted canned beans. Dairy Low-fat dairy products, such as skim or 1% milk, 2% or reduced-fat cheeses, low-fat ricotta or cottage cheese, or plain low-fat yogurt. Low-sodium or reduced-sodium  cheeses. Fats and Oils Tub margarines without trans fats. Light or reduced-fat mayonnaise and salad dressings (reduced sodium). Avocado. Safflower, olive, or canola oils. Natural peanut or almond butter. Other Unsalted popcorn and pretzels. The items listed above may not be a complete list of recommended foods or beverages. Contact your dietitian for more options. WHAT FOODS ARE NOT RECOMMENDED? Grains White bread. White pasta. White rice. Refined cornbread. Bagels and croissants. Crackers that contain trans fat. Vegetables Creamed or fried vegetables. Vegetables in a cheese sauce. Regular canned vegetables. Regular canned tomato sauce and paste. Regular tomato and vegetable juices. Fruits Dried fruits. Canned fruit in light or heavy syrup. Fruit juice. Meat and Other Protein Products Fatty cuts of meat. Ribs, chicken wings, bacon, sausage, bologna, salami, chitterlings, fatback, hot dogs, bratwurst, and packaged luncheon meats. Salted nuts and seeds. Canned beans with salt. Dairy Whole or 2% milk, cream, half-and-half, and cream cheese. Whole-fat or sweetened yogurt. Full-fat cheeses or blue cheese. Nondairy creamers and whipped toppings. Processed cheese, cheese spreads, or cheese curds. Condiments Onion and garlic salt, seasoned salt, table salt, and sea salt. Canned and packaged gravies. Worcestershire sauce. Tartar sauce. Barbecue sauce. Teriyaki sauce. Soy sauce, including reduced sodium. Steak sauce. Fish sauce. Oyster sauce. Cocktail sauce. Horseradish. Ketchup and mustard. Meat flavorings and tenderizers. Bouillon cubes. Hot sauce. Tabasco sauce. Marinades. Taco seasonings. Relishes. Fats and Oils Butter, stick margarine, lard, shortening, ghee, and bacon fat. Coconut, palm kernel, or palm oils. Regular salad dressings. Other Pickles and olives. Salted popcorn and pretzels. The items listed above may not be a complete list of foods and beverages to avoid. Contact your dietitian for  more information. WHERE CAN I FIND MORE INFORMATION? National Heart, Lung, and Blood Institute: [employer] Document Released: 03/28/2011 Document Revised: 08/23/2013 Document Reviewed: 02/10/2013 [place] Patient Information 2015 [place], [place]. This information is not intended to replace advice given to you by your health care provider. Make sure you discuss any questions you have with your health care provider.  How to Take Your Blood Pressure HOW DO I GET A BLOOD PRESSURE MACHINE?  You can buy an electronic home blood pressure machine at your local pharmacy. Insurance will sometimes cover the cost if you have a prescription.  Ask your doctor what type of machine is best for you. There are different machines for your arm and your wrist.  If you decide to buy a machine to check your blood pressure on your arm, first check the size of your arm so you can buy the right size cuff. To check the size of your arm:   Use a measuring tape that shows both inches and centimeters.   Wrap the measuring tape around the upper-middle part of your arm. You may need someone to help you measure.   Write down your arm measurement in both inches and centimeters.   To measure your blood pressure correctly, it is important to have the right size cuff.   If your arm is up to 13 inches (up to 34 centimeters), get an adult cuff size.  If your arm is 13 to 17 inches (35 to 44 centimeters), get a large adult  cuff size.    If your arm is 17 to 20 inches (45 to 52 centimeters), get an adult thigh cuff.  WHAT DO THE NUMBERS MEAN?   There are two numbers that make up your blood pressure. For example: 120/80.  The first number (120 in our example) is called the "systolic pressure." It is a measure of the pressure in your blood vessels when your heart is pumping blood.  The second number (80 in our example) is called the "diastolic pressure." It is a measure of the  pressure in your blood vessels when your heart is resting between beats.  Your doctor will tell you what your blood pressure should be. WHAT SHOULD I DO BEFORE I CHECK MY BLOOD PRESSURE?   Try to rest or relax for at least 30 minutes before you check your blood pressure.  Do not smoke.  Do not have any drinks with caffeine, such as:  Soda.  Coffee.  Tea.  Check your blood pressure in a quiet room.  Sit down and stretch out your arm on a table. Keep your arm at about the level of your heart. Let your arm relax.  Make sure that your legs are not crossed. HOW DO I CHECK MY BLOOD PRESSURE?  Follow the directions that came with your machine.  Make sure you remove any tight-fighting clothing from your arm or wrist. Wrap the cuff around your upper arm or wrist. You should be able to fit a finger between the cuff and your arm. If you cannot fit a finger between the cuff and your arm, it is too tight and should be removed and rewrapped.  Some units require you to manually pump up the arm cuff.  Automatic units inflate the cuff when you press a button.  Cuff deflation is automatic in both models.  After the cuff is inflated, the unit measures your blood pressure and pulse. The readings are shown on a monitor. Hold still and breathe normally while the cuff is inflated.  Getting a reading takes less than a minute.  Some models store readings in a memory. Some provide a printout of readings. If your machine does not store your readings, keep a written record.  Take readings with you to your next visit with your doctor. Document Released: 03/21/2008 Document Revised: 08/23/2013 Document Reviewed: 06/03/2013 [facility] Patient Information 2015 [place], [place]. This information is not intended to replace advice given to you by your health care provider. Make sure you discuss any questions you have with your health care provider.  Hypertension Hypertension, commonly called high blood  pressure, is when the force of blood pumping through your arteries is too strong. Your arteries are the blood vessels that carry blood from your heart throughout your body. A blood pressure reading consists of a higher number over a lower number, such as 110/72. The higher number (systolic) is the pressure inside your arteries when your heart pumps. The lower number (diastolic) is the pressure inside your arteries when your heart relaxes. Ideally you want your blood pressure below 120/80. Hypertension forces your heart to work harder to pump blood. Your arteries may become narrow or stiff. Having hypertension puts you at risk for heart disease, stroke, and other problems.  RISK FACTORS Some risk factors for high blood pressure are controllable. Others are not.  Risk factors you cannot control include:   Race. You may be at higher risk if you are African American.  Age. Risk increases with age.  Gender. Men are  at higher risk than women before age 50 years. After age 95, women are at higher risk than men. Risk factors you can control include:  Not getting enough exercise or physical activity.  Being overweight.  Getting too much fat, sugar, calories, or salt in your diet.  Drinking too much alcohol. SIGNS AND SYMPTOMS Hypertension does not usually cause signs or symptoms. Extremely high blood pressure (hypertensive crisis) may cause headache, anxiety, shortness of breath, and nosebleed. DIAGNOSIS  To check if you have hypertension, your health care provider will measure your blood pressure while you are seated, with your arm held at the level of your heart. It should be measured at least twice using the same arm. Certain conditions can cause a difference in blood pressure between your right and left arms. A blood pressure reading that is higher than normal on one occasion does not mean that you need treatment. If one blood pressure reading is high, ask your health care provider about having it  checked again. TREATMENT  Treating high blood pressure includes making lifestyle changes and possibly taking medicine. Living a healthy lifestyle can help lower high blood pressure. You may need to change some of your habits. Lifestyle changes may include:  Following the DASH diet. This diet is high in fruits, vegetables, and whole grains. It is low in salt, red meat, and added sugars.  Getting at least 2 hours of brisk physical activity every week.  Losing weight if necessary.  Not smoking.  Limiting alcoholic beverages.  Learning ways to reduce stress. If lifestyle changes are not enough to get your blood pressure under control, your health care provider may prescribe medicine. You may need to take more than one. Work closely with your health care provider to understand the risks and benefits. HOME CARE INSTRUCTIONS  Have your blood pressure rechecked as directed by your health care provider.   Take medicines only as directed by your health care provider. Follow the directions carefully. Blood pressure medicines must be taken as prescribed. The medicine does not work as well when you skip doses. Skipping doses also puts you at risk for problems.   Do not smoke.   Monitor your blood pressure at home as directed by your health care provider. SEEK MEDICAL CARE IF:   You think you are having a reaction to medicines taken.  You have recurrent headaches or feel dizzy.  You have swelling in your ankles.  You have trouble with your vision. SEEK IMMEDIATE MEDICAL CARE IF:  You develop a severe headache or confusion.  You have unusual weakness, numbness, or feel faint.  You have severe chest or abdominal pain.  You vomit repeatedly.  You have trouble breathing. MAKE SURE YOU:   Understand these instructions.  Will watch your condition.  Will get help right away if you are not doing well or get worse. Document Released: 04/08/2005 Document Revised: 08/23/2013  Document Reviewed: 01/29/2013 [facility] Patient Information 2015 [place], [place]. This information is not intended to replace advice given to you by your health care provider. Make sure you discuss any questions you have with your health care provider.

## 2013-12-20 NOTE — ED Provider Notes (Signed)
TIME SEEN: 8:03 PM  CHIEF COMPLAINT: Hypertension, intermittent chest pain, intermittent headache  HPI: Patient is a 28 year old female with history of hypertension who was on amlodipine 10 mg daily presents to the emergency department with hypertension. She reports she recently went to the health Department on 8/18 for evaluation for a rash and was noted to have elevated blood pressure. The physician at the health Department started her on hydrochlorothiazide 12.5 mg daily. She reports that she has been checking her blood pressure every day at home and has had blood pressure as high as 183/85. She has had some intermittent mild throbbing headache and chest pain that she is unable to describe, last episode yesterday. No shortness of breath. No blurry vision or vision changes or vision loss. No numbness, tingling or focal weakness.   ROS: See HPI Constitutional: no fever  Eyes: no drainage  ENT: no runny nose   Cardiovascular:  no chest pain  Resp: no SOB  GI: no vomiting GU: no dysuria Integumentary: no rash  Allergy: no hives  Musculoskeletal: no leg swelling  Neurological: no slurred speech ROS otherwise negative  PAST MEDICAL HISTORY/PAST SURGICAL HISTORY:  Past Medical History  Diagnosis Date  . Hypertension   . Bronchitis     MEDICATIONS:  Prior to Admission medications   Medication Sig Start Date End Date Taking? Authorizing Provider  amitriptyline (ELAVIL) 10 MG tablet Take 10 mg by mouth daily.    Historical Provider, MD  omeprazole (PRILOSEC) 20 MG capsule Take 1 capsule (20 mg total) by mouth daily. 10/06/13   Lyanne Co, MD  oxyCODONE-acetaminophen (PERCOCET/ROXICET) 5-325 MG per tablet Take 1 tablet by mouth every 4 (four) hours as needed. 11/08/13   Burgess Amor, PA-C  TRIAMCINOLONE ACETONIDE, TOP, 0.05 % OINT Apply to affected areas BID 10/25/13   Hope Orlene Och, NP    ALLERGIES:  Allergies  Allergen Reactions  . Hydrocodone Nausea And Vomiting  . Naproxen Nausea  And Vomiting  . Tramadol     "bleed"    SOCIAL HISTORY:  History  Substance Use Topics  . Smoking status: Current Every Day Smoker    Types: Cigarettes  . Smokeless tobacco: Not on file  . Alcohol Use: Yes     Comment: occ    FAMILY HISTORY: No family history on file.  EXAM: BP 152/84  Pulse 96  Temp(Src) 99.4 F (37.4 C) (Oral)  Resp 24  Ht  (1.753 m)  Wt 296 lb (134.265 kg)  BMI 43.69 kg/m2  SpO2 100%  LMP 12/20/2013 CONSTITUTIONAL: Alert and oriented and responds appropriately to questions. Well-appearing; well-nourished, appears very anxious, tearful HEAD: Normocephalic EYES: Conjunctivae clear, PERRL ENT: normal nose; no rhinorrhea; moist mucous membranes; pharynx without lesions noted NECK: Supple, no meningismus, no LAD  CARD: RRR; S1 and S2 appreciated; no murmurs, no clicks, no rubs, no gallops RESP: Normal chest excursion without splinting or tachypnea; breath sounds clear and equal bilaterally; no wheezes, no rhonchi, no rales,  ABD/GI: Normal bowel sounds; non-distended; soft, non-tender, no rebound, no guarding BACK:  The back appears normal and is non-tender to palpation, there is no CVA tenderness EXT: Normal ROM in all joints; non-tender to palpation; no edema; normal capillary refill; no cyanosis    SKIN: Normal color for age and race; warm NEURO: Moves all extremities equally, sensation to light touch intact diffusely, cranial nerves II through XII intact, strength 5/5 in all 4 extremities PSYCH: The patient's mood and manner are appropriate. Grooming and personal  hygiene are appropriate.  MEDICAL DECISION MAKING: Patient here with hypertension. She has had some intermittent headaches and chest pain. Her blood pressure in the ED has improved without intervention is 152/84. I have attempted to reassure patient and her significant other. Patient's girlfriend becoming increasingly upset and had to be asked to leave the room as she is yelling at the  physician.  Discussed with patient I do not feel she is having an intracranial hemorrhage given she is very well-appearing and her blood pressure has are improved. We'll give her Tylenol for her headache. Will check basic labs and urine to evaluate for any signs of end organ damage but I suspect it will be normal. EKG shows no significant change compared to prior EKGs. Attempted to reassure patient. I discussed with the patient that I feel that her significant other is also contributing to her anxiety which may also increase her blood pressure. Have discussed with patient I recommend that she does not check her blood pressure multiple times a day but can check it once a day when she is feeling well after she is ready taken her blood pressure medication. Have discussed with her that this is something that she needs to followup with her primary care physician for but we do not need to acutely lower her blood pressure from 152/84 today.   ED PROGRESS: Labs are unremarkable other than a leukocytosis which has been chronic for the patient (noated in 2012). Potassium is slightly low. We'll replace. Creatinine normal. Troponin negative. There is a small amount of protein in the patient's urine and hemoglobin but she is currently on her menstrual cycle. We'll have her have the health Department follow this. Pregnancy test negative. Blood pressure is 142/67. Headache is gone after Tylenol. I feel she is safe to be discharged home. Have discussed with her the recommend checking her blood pressure when she is feeling well after she has had her blood pressure medication to 3 times a week and keeping a log of this information to you she presented to her primary care doctor's office. Discussed with her reasons to return to the emergency department including severe headache, vision changes, numbness or weakness on one side of her body, persistent chest pain or shortness of breath. Patient verbalizes understanding and is  comfortable with plan.     EKG Interpretation  Date/Time:  Monday December 20 2013 20:34:04 EDT Ventricular Rate:  89 PR Interval:  170 QRS Duration: 88 QT Interval:  373 QTC Calculation: 454 R Axis:   67 Text Interpretation:  Sinus rhythm Borderline T abnormalities, inferior leads No change when compared to August 2006, June 2015 Confirmed by [place],  DO, Jhan Conery [phone]) on 12/20/2013 8:40:03 PM         WBC  Date Value Ref Range Status  12/20/2013 19.5* 4.0 - 10.5 K/uL Final  10/06/2013 19.1* 4.0 - 10.5 K/uL Final  06/24/2013 13.2* 4.0 - 10.5 K/uL Final  11/30/2009 13.5* 4.0-10.5 10*3/microliter Final      Layla Maw Iridessa Harrow, DO 12/20/13 2138

## 2013-12-20 NOTE — ED Notes (Signed)
Pt here with c/o HA and high blood pressure. Pt recently placed on HCTZ by the health department in addition to her amlodipine. Pt reports she takes her BP at home with a wrist cuff and it is high. Went to [facility] today to check BP and reports it being 183/85. Reports the HA has been for "several weeks".

## 2014-01-28 ENCOUNTER — Emergency Department (HOSPITAL_COMMUNITY)
Admission: EM | Admit: 2014-01-28 | Discharge: 2014-01-28 | Disposition: A | Discharge status: Home / Self Care | Payer: Self-pay | Attending: Emergency Medicine | Admitting: Emergency Medicine

## 2014-01-28 ENCOUNTER — Encounter (HOSPITAL_COMMUNITY): Payer: Self-pay | Admitting: Emergency Medicine

## 2014-01-28 DIAGNOSIS — I10 Essential (primary) hypertension: Secondary | ICD-10-CM | POA: Insufficient documentation

## 2014-01-28 DIAGNOSIS — Z79899 Other long term (current) drug therapy: Secondary | ICD-10-CM | POA: Insufficient documentation

## 2014-01-28 DIAGNOSIS — S0003XA Contusion of scalp, initial encounter: Secondary | ICD-10-CM | POA: Insufficient documentation

## 2014-01-28 DIAGNOSIS — S0033XA Contusion of nose, initial encounter: Secondary | ICD-10-CM | POA: Insufficient documentation

## 2014-01-28 DIAGNOSIS — Z8709 Personal history of other diseases of the respiratory system: Secondary | ICD-10-CM | POA: Insufficient documentation

## 2014-01-28 DIAGNOSIS — Z720 Tobacco use: Secondary | ICD-10-CM | POA: Insufficient documentation

## 2014-01-28 MED ORDER — ACETAMINOPHEN 500 MG PO TABS
1000.0000 mg | ORAL_TABLET | Freq: Once | ORAL | Status: AC
  Administered 2014-01-28: 1000 mg via ORAL
  Filled 2014-01-28: qty 2
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
## 2014-01-28 NOTE — Discharge Instructions (Signed)
Please use Tylenol every 4 hours for soreness. Please see your primary physician or return to the emergency department if any changes or problems. Contusion A contusion is a deep bruise. Contusions happen when an injury causes bleeding under the skin. Signs of bruising include pain, puffiness (swelling), and discolored skin. The contusion may turn blue, purple, or yellow. HOME CARE   Put ice on the injured area.  Put ice in a plastic bag.  Place a towel between your skin and the bag.  Leave the ice on for 15-20 minutes, 03-04 times a day.  Only take medicine as told by your doctor.  Rest the injured area.  If possible, raise (elevate) the injured area to lessen puffiness. GET HELP RIGHT AWAY IF:   You have more bruising or puffiness.  You have pain that is getting worse.  Your puffiness or pain is not helped by medicine. MAKE SURE YOU:   Understand these instructions.  Will watch your condition.  Will get help right away if you are not doing well or get worse. Document Released: 09/25/2007 Document Revised: 07/01/2011 Document Reviewed: 02/11/2011 [facility] Patient Information 2015 [place], [place]. This information is not intended to replace advice given to you by your health care provider. Make sure you discuss any questions you have with your health care provider.

## 2014-01-29 NOTE — ED Provider Notes (Signed)
Medical screening examination/treatment/procedure(s) were performed by non-physician practitioner and as supervising physician I was immediately available for consultation/collaboration.    Rielly Brunn D Tytiana Coles, MD 01/29/14 1614 

## 2014-03-31 ENCOUNTER — Encounter (HOSPITAL_COMMUNITY): Payer: Self-pay

## 2014-03-31 ENCOUNTER — Emergency Department (HOSPITAL_COMMUNITY)
Admission: EM | Admit: 2014-03-31 | Discharge: 2014-03-31 | Disposition: A | Discharge status: Home / Self Care | Payer: Self-pay | Attending: Emergency Medicine | Admitting: Emergency Medicine

## 2014-03-31 DIAGNOSIS — Z792 Long term (current) use of antibiotics: Secondary | ICD-10-CM | POA: Insufficient documentation

## 2014-03-31 DIAGNOSIS — Z79899 Other long term (current) drug therapy: Secondary | ICD-10-CM | POA: Insufficient documentation

## 2014-03-31 DIAGNOSIS — Z720 Tobacco use: Secondary | ICD-10-CM | POA: Insufficient documentation

## 2014-03-31 DIAGNOSIS — Z8709 Personal history of other diseases of the respiratory system: Secondary | ICD-10-CM | POA: Insufficient documentation

## 2014-03-31 DIAGNOSIS — L0291 Cutaneous abscess, unspecified: Secondary | ICD-10-CM

## 2014-03-31 DIAGNOSIS — N61 Inflammatory disorders of breast: Secondary | ICD-10-CM | POA: Insufficient documentation

## 2014-03-31 DIAGNOSIS — I10 Essential (primary) hypertension: Secondary | ICD-10-CM | POA: Insufficient documentation

## 2014-03-31 MED ORDER — SULFAMETHOXAZOLE-TRIMETHOPRIM 800-160 MG PO TABS: 1.0000 | ORAL_TABLET | Freq: Two times a day (BID) | ORAL | Status: DC

## 2014-03-31 MED ORDER — OXYCODONE-ACETAMINOPHEN 5-325 MG PO TABS: 1.0000 | ORAL_TABLET | Freq: Every evening | ORAL | Status: DC | PRN

## 2014-03-31 NOTE — ED Provider Notes (Signed)
CSN: [id number]     Arrival date & time 03/31/14  1650 History   First MD Initiated Contact with Patient 03/31/14 1722     Chief Complaint  Patient presents with  . Breast Mass     (Consider location/radiation/quality/duration/timing/severity/associated sxs/prior Treatment) The history is provided by the patient.   Norma Nelson is a 28 y.o. female  presenting with a 2 day history of a skin nodule on her upper chest wall between her breasts which worsened 2 days ago.  She she reports having a persistent small "knot" at this site which has enlarged this week.  There has been no redness, drainage.  She has found no alleviators for this symptom.  She has prior history of occasional abscesses, most recently of groin abscess this past summer and a prior right breast abscess which occurred 2 years ago.  To her knowledge she does not have MRSA.  She denies fevers or chills, other symptoms.  Pain is worse at night, stating she can only sleep on her stomach and her pain worsens when she tries to sleep.  Past Medical History  Diagnosis Date  . Hypertension   . Bronchitis    History reviewed. No pertinent past surgical history. History reviewed. No pertinent family history. History  Substance Use Topics  . Smoking status: Current Every Day Smoker -- 0.50 packs/day    Types: Cigarettes  . Smokeless tobacco: Not on file  . Alcohol Use: Yes     Comment: occ   OB History    No data available     Review of Systems  Constitutional: Negative for fever and chills.  Respiratory: Negative for shortness of breath and wheezing.   Skin:       Negative except as mentioned in HPI.    Neurological: Negative for numbness.      Allergies  Hydrocodone; Naproxen; and Tramadol  Home Medications   Prior to Admission medications   Medication Sig Start Date End Date Taking? Authorizing Provider  amLODipine (NORVASC) 10 MG tablet Take 10 mg by mouth daily.   Yes Historical Provider, MD   hydrochlorothiazide (HYDRODIURIL) 25 MG tablet Take 12.5 mg by mouth daily.   Yes Historical Provider, MD  oxyCODONE-acetaminophen (PERCOCET/ROXICET) 5-325 MG per tablet Take 1 tablet by mouth at bedtime as needed for moderate pain. 03/31/14   Burgess AmorJulie Kimmerly Lora, PA-C  sulfamethoxazole-trimethoprim (SEPTRA DS) 800-160 MG per tablet Take 1 tablet by mouth every 12 (twelve) hours. 03/31/14   Burgess AmorJulie Trinaty Bundrick, PA-C   BP 153/91 mmHg  Pulse 86  Temp(Src) 98.2 F (36.8 C) (Oral)  Resp 16  Ht 5\' 9"  (1.753 m)  Wt 300 lb (136.079 kg)  BMI 44.28 kg/m2  SpO2 97%  LMP 03/31/2014 Physical Exam  Constitutional: She appears well-developed and well-nourished. No distress.  HENT:  Head: Normocephalic.  Neck: Neck supple.  Cardiovascular: Normal rate.   Pulmonary/Chest: Effort normal. She has no wheezes.  Musculoskeletal: Normal range of motion. She exhibits no edema.  Skin: Skin is warm and dry. No rash noted. No erythema.  Approximate 1 cm subcutaneous induration midline chest wall.  There is a blackhead at the inferior border of this lesion suggesting sebaceous cyst.  There is no surrounding erythema or edema, no red streaking.  No drainage from the site.    ED Course  Procedures (including critical care time) Labs Review Labs Reviewed - No data to display  Imaging Review No results found.   EKG Interpretation None      MDM  Final diagnoses:  Abscess    I suspect this is an infected sebaceous cyst.  Patient defers I&D at this time.  She was placed on Bactrim, encouraged warm compresses.  She was given oxycodone for daily at bedtime use.  Encouraged recheck of her symptoms do not resolve or worsen in anyway.     Burgess AmorJulie Merlin Ege, PA-C 03/31/14 1820  Samuel JesterKathleen McManus, DO 04/03/14 1450

## 2014-03-31 NOTE — ED Notes (Signed)
Patient c/o of "lump on/bedside right breast", which she states she first noticed 12/08 c/o that lump has "gotten bigger" since she first noticed it. Last breast exam by physician one year ago was WNL.

## 2014-03-31 NOTE — Discharge Instructions (Signed)
Abscess An abscess is an infected area that contains a collection of pus and debris.It can occur in almost any part of the body. An abscess is also known as a furuncle or boil. CAUSES  An abscess occurs when tissue gets infected. This can occur from blockage of oil or sweat glands, infection of hair follicles, or a minor injury to the skin. As the body tries to fight the infection, pus collects in the area and creates pressure under the skin. This pressure causes pain. People with weakened immune systems have difficulty fighting infections and get certain abscesses more often.  SYMPTOMS Usually an abscess develops on the skin and becomes a painful mass that is red, warm, and tender. If the abscess forms under the skin, you may feel a moveable soft area under the skin. Some abscesses break open (rupture) on their own, but most will continue to get worse without care. The infection can spread deeper into the body and eventually into the bloodstream, causing you to feel ill.  DIAGNOSIS  Your caregiver will take your medical history and perform a physical exam. A sample of fluid may also be taken from the abscess to determine what is causing your infection. TREATMENT  Your caregiver may prescribe antibiotic medicines to fight the infection. However, taking antibiotics alone usually does not cure an abscess. Your caregiver may need to make a small cut (incision) in the abscess to drain the pus. In some cases, gauze is packed into the abscess to reduce pain and to continue draining the area. HOME CARE INSTRUCTIONS   Only take over-the-counter or prescription medicines for pain, discomfort, or fever as directed by your caregiver.  If you were prescribed antibiotics, take them as directed. Finish them even if you start to feel better.  If gauze is used, follow your caregiver's directions for changing the gauze.  To avoid spreading the infection:  Keep your draining abscess covered with a  bandage.  Wash your hands well.  Do not share personal care items, towels, or whirlpools with others.  Avoid skin contact with others.  Keep your skin and clothes clean around the abscess.  Keep all follow-up appointments as directed by your caregiver. SEEK MEDICAL CARE IF:   You have increased pain, swelling, redness, fluid drainage, or bleeding.  You have muscle aches, chills, or a general ill feeling.  You have a fever. MAKE SURE YOU:   Understand these instructions.  Will watch your condition.  Will get help right away if you are not doing well or get worse. Document Released: 01/16/2005 Document Revised: 10/08/2011 Document Reviewed: 06/21/2011 [facility] Patient Information 2015 [address] CreekExitCare, [place]. This information is not intended to replace advice given to you by your health care provider. Make sure you discuss any questions you have with your health care provider.   Avoid rubbing or squeezing this site.  Apply warm compresses as discussed.  Get rechecked if this does not resolve or if it continues to enlarge.

## 2014-04-27 ENCOUNTER — Emergency Department (HOSPITAL_COMMUNITY): Payer: Self-pay

## 2014-04-27 ENCOUNTER — Encounter (HOSPITAL_COMMUNITY): Payer: Self-pay | Admitting: *Deleted

## 2014-04-27 ENCOUNTER — Emergency Department (HOSPITAL_COMMUNITY)
Admission: EM | Admit: 2014-04-27 | Discharge: 2014-04-28 | Disposition: A | Discharge status: Home / Self Care | Payer: Self-pay | Attending: Emergency Medicine | Admitting: Emergency Medicine

## 2014-04-27 DIAGNOSIS — K029 Dental caries, unspecified: Secondary | ICD-10-CM | POA: Insufficient documentation

## 2014-04-27 DIAGNOSIS — K006 Disturbances in tooth eruption: Secondary | ICD-10-CM | POA: Insufficient documentation

## 2014-04-27 DIAGNOSIS — I10 Essential (primary) hypertension: Secondary | ICD-10-CM | POA: Insufficient documentation

## 2014-04-27 DIAGNOSIS — K0889 Other specified disorders of teeth and supporting structures: Secondary | ICD-10-CM

## 2014-04-27 DIAGNOSIS — Z79899 Other long term (current) drug therapy: Secondary | ICD-10-CM | POA: Insufficient documentation

## 2014-04-27 DIAGNOSIS — K088 Other specified disorders of teeth and supporting structures: Secondary | ICD-10-CM | POA: Insufficient documentation

## 2014-04-27 DIAGNOSIS — Z792 Long term (current) use of antibiotics: Secondary | ICD-10-CM | POA: Insufficient documentation

## 2014-04-27 DIAGNOSIS — R059 Cough, unspecified: Secondary | ICD-10-CM

## 2014-04-27 DIAGNOSIS — J069 Acute upper respiratory infection, unspecified: Secondary | ICD-10-CM | POA: Insufficient documentation

## 2014-04-27 DIAGNOSIS — R05 Cough: Secondary | ICD-10-CM

## 2014-04-27 DIAGNOSIS — Z720 Tobacco use: Secondary | ICD-10-CM | POA: Insufficient documentation

## 2014-04-27 DIAGNOSIS — B9789 Other viral agents as the cause of diseases classified elsewhere: Secondary | ICD-10-CM

## 2014-04-27 MED ORDER — PROMETHAZINE-CODEINE 6.25-10 MG/5ML PO SYRP
5.0000 mL | ORAL_SOLUTION | Freq: Once | ORAL | Status: AC
  Administered 2014-04-27: 5 mL via ORAL
  Filled 2014-04-27: qty 5

## 2014-04-27 NOTE — ED Notes (Signed)
Pt reports productive cough for the past 2 days. States she is getting up green sputum. Pt complaining of generalized body aches & having bottom right tooth pain that she is to see dentist next week about.

## 2014-04-27 NOTE — ED Notes (Signed)
Pt states she is coughing up green stuff. She states she has been taking mucinex DM, robitussin and nyquil.

## 2014-04-28 MED ORDER — PROMETHAZINE-CODEINE 6.25-10 MG/5ML PO SYRP: 5.0000 mL | ORAL_SOLUTION | ORAL | Status: DC | PRN

## 2014-04-28 NOTE — ED Provider Notes (Signed)
CSN: [id number]     Arrival date & time 04/27/14  2154 History   First MD Initiated Contact with Patient 04/27/14 2308     Chief Complaint  Patient presents with  . Cough     (Consider location/radiation/quality/duration/timing/severity/associated sxs/prior Treatment) The history is provided by the patient and a friend.   Norma Nelson is a 29 y.o. female presenting with a 2 day history of cough which has been productive for a thick green sputum, generalized body aches, nasal congestion without drainage, mild sore throat, subjective fevers and fatigue.  She denies  ear or sinus pain, shortness of breath, chest pain, rash.  She does endorse right lower 2nd molar tooth pain for which she is scheduled to see a dentist next week.  She has taken mucinex DM, robitussin and nyquill without much relief.      Past Medical History  Diagnosis Date  . Hypertension   . Bronchitis    History reviewed. No pertinent past surgical history. History reviewed. No pertinent family history. History  Substance Use Topics  . Smoking status: Current Every Day Smoker -- 0.50 packs/day    Types: Cigarettes  . Smokeless tobacco: Not on file  . Alcohol Use: Yes     Comment: occ   OB History    No data available     Review of Systems  Constitutional: Positive for fever and chills.  HENT: Positive for congestion and sore throat. Negative for ear pain, rhinorrhea, sinus pressure, trouble swallowing and voice change.   Eyes: Negative for discharge.  Respiratory: Positive for cough. Negative for shortness of breath, wheezing and stridor.   Cardiovascular: Negative for chest pain.  Gastrointestinal: Negative for abdominal pain.  Genitourinary: Negative.       Allergies  Hydrocodone; Naproxen; and Tramadol  Home Medications   Prior to Admission medications   Medication Sig Start Date End Date Taking? Authorizing Provider  amLODipine (NORVASC) 10 MG tablet Take 10 mg by mouth daily.   Yes  Historical Provider, MD  guaiFENesin (ROBITUSSIN) 100 MG/5ML SOLN Take 5 mLs by mouth every 4 (four) hours as needed for cough or to loosen phlegm.   Yes Historical Provider, MD  lisinopril-hydrochlorothiazide (PRINZIDE,ZESTORETIC) 10-12.5 MG per tablet Take 1 tablet by mouth daily.   Yes Historical Provider, MD  Pseudoeph-Doxylamine-DM-APAP (NYQUIL MULTI-SYMPTOM PO) Take 15-30 mLs by mouth daily as needed (for cough).   Yes Historical Provider, MD  oxyCODONE-acetaminophen (PERCOCET/ROXICET) 5-325 MG per tablet Take 1 tablet by mouth at bedtime as needed for moderate pain. Patient not taking: Reported on 04/27/2014 03/31/14   Burgess AmorJulie Demetrios Byron, PA-C  promethazine-codeine (PHENERGAN WITH CODEINE) 6.25-10 MG/5ML syrup Take 5 mLs by mouth every 4 (four) hours as needed for cough. 04/28/14   Burgess AmorJulie Shylyn Younce, PA-C  sulfamethoxazole-trimethoprim (SEPTRA DS) 800-160 MG per tablet Take 1 tablet by mouth every 12 (twelve) hours. Patient not taking: Reported on 04/27/2014 03/31/14   Burgess AmorJulie Cadyn Rodger, PA-C   BP 136/83 mmHg  Pulse 85  Temp(Src) 98.2 F (36.8 C) (Oral)  Resp 16  Ht 5\' 11"  (1.803 m)  Wt 300 lb (136.079 kg)  BMI 41.86 kg/m2  SpO2 100%  LMP 04/07/2014 Physical Exam  Constitutional: She is oriented to person, place, and time. She appears well-developed and well-nourished.  HENT:  Head: Normocephalic and atraumatic.  Right Ear: Tympanic membrane and ear canal normal.  Left Ear: Tympanic membrane and ear canal normal.  Nose: Mucosal edema present. No rhinorrhea.  Mouth/Throat: Uvula is midline, oropharynx is clear and  moist and mucous membranes are normal. Abnormal dentition. No oropharyngeal exudate, posterior oropharyngeal edema, posterior oropharyngeal erythema or tonsillar abscesses.  Decay noted right 2nd lower molar tooth along gingival line. No gingival edema or erythema.  Eyes: Conjunctivae are normal.  Cardiovascular: Normal rate and normal heart sounds.   Pulmonary/Chest: Effort normal. No respiratory  distress. She has no decreased breath sounds. She has no wheezes. She has no rhonchi. She has no rales.  Abdominal: Soft. There is no tenderness.  Musculoskeletal: Normal range of motion.  Neurological: She is alert and oriented to person, place, and time.  Skin: Skin is warm and dry. No rash noted.  Psychiatric: She has a normal mood and affect.    ED Course  Procedures (including critical care time) Labs Review Labs Reviewed - No data to display  Imaging Review Dg Chest 2 View  04/27/2014   CLINICAL DATA:  Acute onset of productive cough, congestion and generalized body aches for 2 days. Initial encounter.  EXAM: CHEST  2 VIEW  COMPARISON:  Chest radiograph from 10/06/2013  FINDINGS: The lungs are well-aerated and clear. There is no evidence of focal opacification, pleural effusion or pneumothorax.  The heart is normal in size; the mediastinal contour is within normal limits. No acute osseous abnormalities are seen.  IMPRESSION: No acute cardiopulmonary process seen.   Electronically Signed   By: Roanna Raider M.D.   On: 04/27/2014 23:56     EKG Interpretation None      MDM   Final diagnoses:  Cough  Viral URI with cough  Toothache    Patients labs and/or radiological studies were viewed and considered during the medical decision making and disposition process. Pt with uri type symptoms, suspect viral.  No sign of dental infection/abscess.  Encouraged rest, increased fluid intake, phenergan with codeine syrup prescribed.  The patient appears reasonably screened and/or stabilized for discharge and I doubt any other medical condition or other [facility] requiring further screening, evaluation, or treatment in the ED at this time prior to discharge.     Burgess Amor, PA-C 04/28/14 1610  Loren Racer, MD 04/29/14 0005

## 2014-04-28 NOTE — ED Notes (Signed)
Pt left ED, ambulatory, with no signs of distress. Pt verbalizes discharge instructions.

## 2014-04-28 NOTE — Discharge Instructions (Signed)
Dental Pain Toothache is pain in or around a tooth. It may get worse with chewing or with cold or heat.  HOME CARE  Your dentist may use a numbing medicine during treatment. If so, you may need to avoid eating until the medicine wears off. Ask your dentist about this.  Only take medicine as told by your dentist or doctor.  Avoid chewing food near the painful tooth until after all treatment is done. Ask your dentist about this. GET HELP RIGHT AWAY IF:   The problem gets worse or new problems appear.  You have a fever.  There is redness and puffiness (swelling) of the face, jaw, or neck.  You cannot open your mouth.  There is pain in the jaw.  There is very bad pain that is not helped by medicine. MAKE SURE YOU:   Understand these instructions.  Will watch your condition.  Will get help right away if you are not doing well or get worse. Document Released: 09/25/2007 Document Revised: 07/01/2011 Document Reviewed: 09/25/2007 [facility] Patient Information 2015 [place], [place]. This information is not intended to replace advice given to you by your health care provider. Make sure you discuss any questions you have with your health care provider.  Viral Infections A viral infection can be caused by different types of viruses.Most viral infections are not serious and resolve on their own. However, some infections may cause severe symptoms and may lead to further complications. SYMPTOMS Viruses can frequently cause:  Minor sore throat.  Aches and pains.  Headaches.  Runny nose.  Different types of rashes.  Watery eyes.  Tiredness.  Cough.  Loss of appetite.  Gastrointestinal infections, resulting in nausea, vomiting, and diarrhea. These symptoms do not respond to antibiotics because the infection is not caused by bacteria. However, you might catch a bacterial infection following the viral infection. This is sometimes called a "superinfection." Symptoms of such a  bacterial infection may include:  Worsening sore throat with pus and difficulty swallowing.  Swollen neck glands.  Chills and a high or persistent fever.  Severe headache.  Tenderness over the sinuses.  Persistent overall ill feeling (malaise), muscle aches, and tiredness (fatigue).  Persistent cough.  Yellow, green, or brown mucus production with coughing. HOME CARE INSTRUCTIONS   Only take over-the-counter or prescription medicines for pain, discomfort, diarrhea, or fever as directed by your caregiver.  Drink enough water and fluids to keep your urine clear or pale yellow. Sports drinks can provide valuable electrolytes, sugars, and hydration.  Get plenty of rest and maintain proper nutrition. Soups and broths with crackers or rice are fine. SEEK IMMEDIATE MEDICAL CARE IF:   You have severe headaches, shortness of breath, chest pain, neck pain, or an unusual rash.  You have uncontrolled vomiting, diarrhea, or you are unable to keep down fluids.  You or your child has an oral temperature above 102 F (38.9 C), not controlled by medicine.  Your baby is older than 3 months with a rectal temperature of 102 F (38.9 C) or higher.  Your baby is 86 months old or younger with a rectal temperature of 100.4 F (38 C) or higher. MAKE SURE YOU:   Understand these instructions.  Will watch your condition.  Will get help right away if you are not doing well or get worse. Document Released: 01/16/2005 Document Revised: 07/01/2011 Document Reviewed: 08/13/2010 [facility] Patient Information 2015 [place], [place]. This information is not intended to replace advice given to you by your health care provider.  Make sure you discuss any questions you have with your health care provider.   Emergency Department Resource Guide 1) Find a Doctor and Pay Out of Pocket Although you won't have to find out who is covered by your insurance plan, it is a good idea to ask around and get  recommendations. You will then need to call the office and see if the doctor you have chosen will accept you as a new patient and what types of options they offer for patients who are self-pay. Some doctors offer discounts or will set up payment plans for their patients who do not have insurance, but you will need to ask so you aren't surprised when you get to your appointment.  2) Contact Your Local Health Department Not all health departments have doctors that can see patients for sick visits, but many do, so it is worth a call to see if yours does. If you don't know where your local health department is, you can check in your phone book. The CDC also has a tool to help you locate your state's health department, and many state websites also have listings of all of their local health departments.  3) Find a Walk-in Clinic If your illness is not likely to be very severe or complicated, you may want to try a walk in clinic. These are popping up all over the country in pharmacies, drugstores, and shopping centers. They're usually staffed by nurse practitioners or physician assistants that have been trained to treat common illnesses and complaints. They're usually fairly quick and inexpensive. However, if you have serious medical issues or chronic medical problems, these are probably not your best option.  No Primary Care Doctor: - Call Health Connect at  [phone] - they can help you locate a primary care doctor that  accepts your insurance, provides certain services, etc. - Physician Referral Service- [phone]  Chronic Pain Problems: Organization         Address  Phone   Notes  [facility]  [phone] Patients need to be referred by their primary care doctor.   Medication Assistance: Organization         Address  Phone   Notes  [facility] Medication [facility] [address] [place], [place] [address] [phone] --Must be a resident of  [facility] -- Must have NO insurance coverage whatsoever (no Medicaid/ Medicare, etc.) -- The pt. MUST have a primary care doctor that directs their care regularly and follows them in the community   [facility]  [phone]   [employer]  [phone]    Agencies that provide inexpensive medical care: Organization         Address  Phone   Notes  [facility]  [phone]   [facility]    [phone]   [facility] [address] [place], [place] [address] [phone]   [facility] [address] [place] [place] [phone]   Planned Parenthood    [phone]   [facility]    [phone]   [facility]  [address], [place] Phone:  [phone], Fax:  [phone] Hours of Operation:  9 am - 6 pm, M-F.  Also accepts Medicaid/Medicare and self-pay.  [facility]  [address], [place] Phone: [phone], Fax: [phone]. Hours of Operation:  8:30  am - 5:30 pm, M-F.  Also accepts Medicaid and self-pay.  [facility] [place] [address] [place] Phone: [phone]   [facility] [address] Natasha Bence [place], [place] [phone], Ext. [phone] Mondays & Thursdays: 7-9 AM.  First 15 patients are seen on a first come, first serve basis.    Medicaid-accepting [facility] Providers:  Organization         Address  Phone   Notes  [facility] [address], [place] [phone] Also accepts self-pay patients.  [facility] [address] Laurell Josephs [place], [place]  [phone]   [facility] [address], [place] [phone]   [facility] [address] [place] [phone]   Renaye Rakers [address], [place]   [phone] Only accepts [place] Access  [place] patients after they have their name applied to their card.   Self-Pay (no insurance) in [facility]:  Organization         Address  Phone   Notes  Sickle Cell Patients, [facility] Medicine [address] [place], [place] [phone]   [facility]gent Care [address] [place], [place] [phone]   [facility] [place]  [address], [place] [phone]   [facility]/Dr. Osei-Bonsu  [address], [place] or [address], [place] [phone] Phone number for both [place] and [place] locations is the same.  [facility]gent [facility] and [facility] [address], [place] [phone]   [facility] [address], [place] or [address] [phone]   [facility] [address], [place] [phone], phone; [phone], fax Sees patients 1st and 3rd Saturday of every month.  Must not qualify for public or private insurance (i.e. Medicaid, Medicare, [place] Health Choice, Veterans' Benefits)  Household income should be no more than 200% of the poverty level The clinic cannot treat you if you are pregnant or think you are pregnant  Sexually transmitted diseases are not treated at the clinic.    Dental Care: Organization         Address  Phone  Notes  [facility] Department of [facility] [address] [place], [place] [phone] Accepts children up to age 64 who are enrolled in [place] or [place] Health Choice; pregnant women with a Medicaid card; and children who have applied for Medicaid or [place] Health Choice, but were declined, whose parents can pay a reduced fee at time of service.  [facility] Department of [facility]  [address], [place] [phone] Accepts children up to age 19 who are enrolled in [place] or [place] Health Choice; pregnant women with a Medicaid  card; and children who have applied for Medicaid or [place] Health Choice, but were declined, whose parents can pay a reduced fee at time of service.  [facility]  [address] [place], [place] [phone] Patients are seen by appointment only. Walk-ins are not accepted. [facility] will see patients 87 years of age and older. Monday - Tuesday (8am-5pm) Most Wednesdays (8:30-5pm) $30 per visit, cash only  [facility] Adult Dental Access PROGRAM  [address], [facility] [phone] Patients are seen by appointment only.  Walk-ins are not accepted. [facility] will see patients 26 years of age and older. One Wednesday Evening (Monthly: Volunteer Based).  $30 per visit, cash only  [employer]  [phone] for adults; Children under age 68, call Graduate Pediatric Dentistry at [phone]. Children aged 56-14, please call [phone] to request a pediatric application.  Dental services are provided in all areas of dental care including fillings, crowns and bridges, complete and partial dentures, implants, gum treatment, root canals, and extractions. Preventive care is also provided. Treatment is provided to both adults and children. Patients are selected via a lottery and there is often a waiting list.   [facility] [address] [place]  [phone] [email or web address]   [facility] [address] [place], [place] [phone], Ext. [phone] Second and Fourth Thursday of each month, opens at 6:30 AM; Clinic ends at 9 AM.  Patients are seen on a first-come first-served basis, and a limited number are seen during each clinic.   [facility]  [address] Ether Griffins [place], [place] [phone]   Eligibility Requirements You must have lived in [place], [place], or [place] counties for at least the last three months.   You cannot be eligible for state or federal sponsored [place],  including [employer], [place], or [employer].   You generally cannot be eligible for healthcare insurance through your employer.    How to apply: Eligibility screenings are held every Tuesday and Wednesday afternoon from 1:00 pm until 4:00 pm. You do not need an appointment for the interview!  [facility] [address] [place], [place] [phone]   [facility] Health Department  [phone]   [facility] Health Department  [phone]   [facility] Health Department  [phone]    Behavioral Health Resources in the Community: Intensive Outpatient Programs Organization         Address  Phone  Notes  [facility] [address], [place], [place] [phone]   [facility] Outpatient [address] [place], [place] [phone]   [facility] [address] [place], [place]  [phone]   [facility] Mental Health [address],  [place], [place] [phone] or [phone]   Substance Abuse Resources Organization         Address  Phone  Notes  Alcohol [facility] Drug Services  [phone]   [facility]  [phone]   [facility] [place]  [phone]   Floydene Flock  [phone]   Residential & Outpatient Substance Abuse Program  [phone]   Psychological Services Organization         Address  Phone  Notes  [facility] Behavioral Health  [phone]   [facility]  [phone]   [facility] Mental Health [address] [place] [phone] or [phone]    Mobile Crisis Teams Organization         Address  Phone  Notes  [facility], Mobile Crisis Care Unit  [phone]   [facility]  [address] [place], [place] [phone]   Doristine Locks [address] [place] [phone]    Self-Help/Support Groups Organization         Address  Phone             Notes  Mental Health Assoc.  of [place] - variety of support groups  [phone]- I7437963 Call for more information  Narcotics  Anonymous (NA), [facility] [address], [place]  2 meetings at this location   Residential Sports administratorTreatment Programs Organization         Address  Phone  Notes  [facility] [address],    [place]  [phone]   [facility]  [address], [place] [phone], [place], [place] [phone]   [facility] [address], [place] [phone] Admissions: 8am-3pm M-F  [facility] [address]    [place], [place] [phone]   [facility] [address], [place], [place] [phone]   [facility] [address]  [place], [place] [phone]   [facility] - Intensive Outpatient [address], [place], [place] [phone]   [facility] ([facility] [address]  [place], [place] [phone] or [phone]   [facility] ([facility]) [address] [place], [place] [phone] Accepts Medicaid  [facility] [address]  [place] [phone] Substance Abuse/Addiction Treatment   [facility] Behavioral Health Resources Organization         Address  Phone  Notes  [facility]  [phone]) [phone]   Angie FavaJulie Brannon, PhD [address] [place], [place]   [phone] or [phone]   [facility]   [address] [place], [place] [phone]) [phone]   [facility] [address], [place], [place] [phone] Insurance/Medicaid/sponsorship through [facility]  [facility] [address]                                    [place], [place] [phone] Therapy/tele-psych/case  [facility] [address]   [place], [place] [phone](336) [phone]    Dr. Lolly MustacheArfeen  [phone]) [phone]   [facility]  [employer] [facility] 1) [address], [place] 2)  [address], [place] 3)  [address], [place] [phone]) [phone]  [phone]) [phone]   [facility] [place] Child Abuse Hotline [phone](336) [phone] or [phone](336) [phone] (After Hours)         Rest and make sure you are drinking plenty of fluids.  Do not drive within 4 hours of using the narcotic cough syrup as this can make you drowsy.  Your xrays are negative for pneumonia today.

## 2014-06-08 ENCOUNTER — Encounter (HOSPITAL_COMMUNITY): Payer: Self-pay | Admitting: Emergency Medicine

## 2014-06-08 ENCOUNTER — Emergency Department (HOSPITAL_COMMUNITY)
Admission: EM | Admit: 2014-06-08 | Discharge: 2014-06-08 | Disposition: A | Discharge status: Home / Self Care | Payer: Self-pay | Attending: Emergency Medicine | Admitting: Emergency Medicine

## 2014-06-08 DIAGNOSIS — Z8709 Personal history of other diseases of the respiratory system: Secondary | ICD-10-CM | POA: Insufficient documentation

## 2014-06-08 DIAGNOSIS — Z79899 Other long term (current) drug therapy: Secondary | ICD-10-CM | POA: Insufficient documentation

## 2014-06-08 DIAGNOSIS — Z862 Personal history of diseases of the blood and blood-forming organs and certain disorders involving the immune mechanism: Secondary | ICD-10-CM | POA: Insufficient documentation

## 2014-06-08 DIAGNOSIS — I10 Essential (primary) hypertension: Secondary | ICD-10-CM | POA: Insufficient documentation

## 2014-06-08 DIAGNOSIS — Z720 Tobacco use: Secondary | ICD-10-CM | POA: Insufficient documentation

## 2014-06-08 DIAGNOSIS — R197 Diarrhea, unspecified: Secondary | ICD-10-CM | POA: Insufficient documentation

## 2014-06-08 DIAGNOSIS — R112 Nausea with vomiting, unspecified: Secondary | ICD-10-CM | POA: Insufficient documentation

## 2014-06-08 DIAGNOSIS — Z792 Long term (current) use of antibiotics: Secondary | ICD-10-CM | POA: Insufficient documentation

## 2014-06-08 HISTORY — DX: Elevated white blood cell count, unspecified: D72.829

## 2014-06-08 LAB — COMPREHENSIVE METABOLIC PANEL
ALT: 27 U/L (ref 0–35)
AST: 21 U/L (ref 0–37)
Albumin: 3.7 g/dL (ref 3.5–5.2)
Alkaline Phosphatase: 106 U/L (ref 39–117)
Anion gap: 4 — ABNORMAL LOW (ref 5–15)
BUN: 11 mg/dL (ref 6–23)
CO2: 24 mmol/L (ref 19–32)
Calcium: 9.2 mg/dL (ref 8.4–10.5)
Chloride: 109 mmol/L (ref 96–112)
Creatinine, Ser: 0.71 mg/dL (ref 0.50–1.10)
GFR calc Af Amer: >90 mL/min (ref >90)
GFR calc non Af Amer: >90 mL/min (ref >90)
Glucose, Bld: 96 mg/dL (ref 70–99)
Potassium: 3.8 mmol/L (ref 3.5–5.1)
Sodium: 137 mmol/L (ref 135–145)
Total Bilirubin: 0.4 mg/dL (ref 0.3–1.2)
Total Protein: 7.7 g/dL (ref 6.0–8.3)

## 2014-06-08 LAB — CBC WITH DIFFERENTIAL/PLATELET
Basophils Absolute: 0 10*3/uL (ref 0.0–0.1)
Basophils Relative: 0 % (ref 0–1)
Eosinophils Absolute: 0.3 10*3/uL (ref 0.0–0.7)
Eosinophils Relative: 1 % (ref 0–5)
HCT: 41.6 % (ref 36.0–46.0)
Hemoglobin: 13.9 g/dL (ref 12.0–15.0)
Lymphocytes Relative: 9 % — ABNORMAL LOW (ref 12–46)
Lymphs Abs: 1.7 10*3/uL (ref 0.7–4.0)
MCH: 27.9 pg (ref 26.0–34.0)
MCHC: 33.4 g/dL (ref 30.0–36.0)
MCV: 83.5 fL (ref 78.0–100.0)
Monocytes Absolute: 1.4 10*3/uL — ABNORMAL HIGH (ref 0.1–1.0)
Monocytes Relative: 7 % (ref 3–12)
Neutro Abs: 16 10*3/uL — ABNORMAL HIGH (ref 1.7–7.7)
Neutrophils Relative %: 83 % — ABNORMAL HIGH (ref 43–77)
Platelets: 429 10*3/uL — ABNORMAL HIGH (ref 150–400)
RBC: 4.98 MIL/uL (ref 3.87–5.11)
RDW: 14.8 % (ref 11.5–15.5)
WBC: 19.4 10*3/uL — ABNORMAL HIGH (ref 4.0–10.5)

## 2014-06-08 LAB — LIPASE, BLOOD: Lipase: 17 U/L (ref 11–59)

## 2014-06-08 MED ORDER — DICYCLOMINE HCL 20 MG PO TABS: 20.0000 mg | ORAL_TABLET | Freq: Four times a day (QID) | ORAL | Status: DC | PRN

## 2014-06-08 MED ORDER — DICYCLOMINE HCL 10 MG PO CAPS
20.0000 mg | ORAL_CAPSULE | Freq: Once | ORAL | Status: AC
  Administered 2014-06-08: 20 mg via ORAL
  Filled 2014-06-08: qty 2

## 2014-06-08 MED ORDER — ONDANSETRON HCL 4 MG/2ML IJ SOLN
4.0000 mg | INTRAMUSCULAR | Status: DC | PRN
  Administered 2014-06-08: 4 mg via INTRAVENOUS
  Filled 2014-06-08: qty 2

## 2014-06-08 MED ORDER — ONDANSETRON HCL 4 MG PO TABS: 4.0000 mg | ORAL_TABLET | Freq: Three times a day (TID) | ORAL | Status: DC | PRN

## 2014-06-08 NOTE — ED Notes (Signed)
Discharge instructions given, pt demonstrated teach back and verbal understanding. No concerns voiced.  

## 2014-06-08 NOTE — Discharge Instructions (Signed)
°Emergency Department Resource Guide °1) Find a Doctor and Pay Out of Pocket °Although you won't have to find out who is covered by your insurance plan, it is a good idea to ask around and get recommendations. You will then need to call the office and see if the doctor you have chosen will accept you as a new patient and what types of options they offer for patients who are self-pay. Some doctors offer discounts or will set up payment plans for their patients who do not have insurance, but you will need to ask so you aren't surprised when you get to your appointment. ° °2) Contact Your Local Health Department °Not all health departments have doctors that can see patients for sick visits, but many do, so it is worth a call to see if yours does. If you don't know where your local health department is, you can check in your phone book. The CDC also has a tool to help you locate your state's health department, and many state websites also have listings of all of their local health departments. ° °3) Find a Walk-in Clinic °If your illness is not likely to be very severe or complicated, you may want to try a walk in clinic. These are popping up all over the country in pharmacies, drugstores, and shopping centers. They're usually staffed by nurse practitioners or physician assistants that have been trained to treat common illnesses and complaints. They're usually fairly quick and inexpensive. However, if you have serious medical issues or chronic medical problems, these are probably not your best option. ° °No Primary Care Doctor: °- Call [facility] at  832-8000 - they can help you locate a primary care doctor that  accepts your insurance, provides certain services, etc. °- Physician Referral Service- 1-800-533-3463 ° °Chronic Pain Problems: °Organization         Address  Phone   Notes  °[facility]  (336) 297-2271 Patients need to be referred by their primary care doctor.  ° °Medication  Assistance: °Organization         Address  Phone   Notes  °[place] [facility] [address] °[place], [place] [address] (336) 641-8030 --Must be a resident of [place] °-- Must have NO insurance coverage whatsoever (no Medicaid/ Medicare, etc.) °-- The pt. MUST have a primary care doctor that directs their care regularly and follows them in the community °  °[facility]  (866) 331-1348   °[employer]  (888) 892-1162   ° °Agencies that provide inexpensive medical care: °Organization         Address  Phone   Notes  °[facility]  (336) 832-8035   °[facility]    (336) 832-7272   °[facility] [address] °[place], [place] [address] (336) 832-4777   °[facility] [address], °[place] (336) 271-4999   °[facility]    (336) 373-0678   °[facility]    (336) 272-1050   °[facility] ° [address], [place] Phone:  (336) 832-4444, Fax:  (336) 832-4440 Hours of Operation:  9 am - 6 pm, M-F.  Also accepts Medicaid/Medicare and self-pay.  °[facility] ° [address], [place] Phone: (336) [phone], Fax: (336) [phone]. Hours of Operation:  8:30 am - 5:30 pm, M-F.  Also accepts Medicaid and self-pay.  °[facility] [place] [address], [place] Phone: (336) 878-6027   °[facility] [address], [place], [place] (336)723-1848, Ext. 123 Mondays & Thursdays: 7-9 AM.  First 15 patients are seen on a first come, first serve basis. °  ° °Medicaid-accepting [place] Providers: ° °Organization         Address  Phone   Notes  °[facility] [address], [place] (336) 641-2100 Also accepts self-pay patients.  °[facility] [address], [place] ° (336) 856-9996   °[facility] [address], [place]  (336) 288-8857   °[facility] [address] [place] [address], [place] (336) 299-7000   °Veita Bland [address], [place]  ° (336) 373-1557 Only accepts [place] Access Medicaid patients after they have their name applied to their card.  ° °Self-Pay (no insurance) in [place]: ° °Organization         Address  Phone   Notes  °Sickle Cell Patients, [facility] [address], [place] (336) 832-1970   °[facility] [address], [place] (336) 832-4400   °[facility] ° [address], [place] (336) 992-4800   °[facility]/Dr. Osei-Bonsu ° [address] [place] [address], [place] or [address], [place] (336) [phone] Phone number for both [place] and [place] locations is the same.  °[facility] [address], [place] (336) 299-0000   °[facility] [address] [place] [address], [place] or [address] (336) 852-7530 °(336) 878-2260   °[facility] [address], [place] (336) 350-1642, phone; (336) 294-5005, fax Sees patients 1st and 3rd Saturday of every month.  Must not qualify for public or private insurance (i.e. Medicaid, Medicare, [place] Health Choice, Veterans' Benefits) • Household income should be no more than 200% of the poverty level •The clinic cannot treat you if you are pregnant or think you are pregnant • Sexually transmitted diseases are not treated at the clinic.  ° ° °Dental Care: °Organization         Address  Phone  Notes  °[place] [facility] [address], [place] (336) 641-6152 Accepts children up to age 21 who are enrolled in Medicaid or [place] Health Choice; pregnant women with a Medicaid card; and children who have applied for Medicaid or [place] Health Choice, but were declined, whose parents can pay a reduced fee at time of service.  °[place]  [facility] [place]  [address], [place] (336) 641-7733 Accepts children up to age 21 who are enrolled in Medicaid or [place] Health Choice; pregnant women with a Medicaid card; and children who have applied for Medicaid or [place] Health Choice, but were declined, whose parents can pay a reduced fee at time of service.  °[facility] ° [address], [place] (336) 641-4533 Patients are seen by appointment only. Walk-ins are not accepted. [facility] will see patients 18 years of age and older. °Monday - Tuesday (8am-5pm) °Most Wednesdays (8:30-5pm) °$30 per visit, cash only  °[facility] ° [address], [place] (336) 641-4533 Patients are seen by appointment only. Walk-ins are not accepted. [facility] will see patients 18 years of age and older. °One   Wednesday Evening (Monthly: Volunteer Based).  $30 per visit, cash only  °[facility]  (919) [phone] for adults; Children under age 4, call Graduate Pediatric Dentistry at ([phone]) [phone]. Children aged 4-14, please call ([phone]) [phone] to request a pediatric application. ° Dental services are provided in all areas of dental care including fillings, crowns and bridges, complete and partial dentures, implants, gum treatment, root canals, and extractions. Preventive care is also provided. Treatment is provided to both adults and children. °Patients are selected via a lottery and there is often a waiting list. °  °[facility] [address], °[place] ° (336) 763-8833 [email or web address] °  °[facility] [address], [place], [place] (336)723-1848, Ext. 123 Second and Fourth Thursday of each month, opens at 6:30 AM; Clinic ends at 9 AM.  Patients are seen on a first-come first-served basis, and a limited number are seen during each clinic.  ° °[facility] ° [address], [place], [place] (336) 723-7904    Eligibility Requirements °You must have lived in [place], [place], or [place] counties for at least the last three months. °  You cannot be eligible for state or federal sponsored healthcare insurance, including Veterans Administration, Medicaid, or Medicare. °  You generally cannot be eligible for healthcare insurance through your employer.  °  How to apply: °Eligibility screenings are held every Tuesday and Wednesday afternoon from 1:00 pm until 4:00 pm. You do not need an appointment for the interview!  °[facility] [address], [place], [place] 336-631-2330   °[facility]  336-342-8273   °[facility]  336-703-3100   °[facility]  336-570-6415   ° °Behavioral Health Resources in the Community: °Intensive Outpatient Programs °Organization         Address  Phone  Notes  °[facility] [address], [place], [place] 336-878-6098   °[facility] [address], [place], [place] 336-832-9800   °[facility] [address], [place], [place] ° 336-882-2125   °[facility] [address],  °[place], [place] 1-800-853-5163 or 336-641-4981   °Substance Abuse Resources °Organization         Address  Phone  Notes  °[facility]  336-882-2125   °[facility]  336-784-9470   °[facility]  336-285-9073   °[facility]  336-845-3988   °Residential & Outpatient Substance Abuse Program  1-800-659-3381   °Psychological Services °Organization         Address  Phone  Notes  °[facility]  336- 832-9600   °[facility]  336- 378-7881   °[facility] [address], [place] 1-800-853-5163 or 336-641-4981   ° °Mobile Crisis Teams °Organization         Address  Phone  Notes  °[facility], Mobile Crisis Care Unit  1-877-626-1772   °Assertive °[facility] ° [address]  [place], [place] 336-834-9664   °Sharon DeEsch [address] °[place] 336-554-5454   ° °Self-Help/Support Groups °Organization         Address  Phone             Notes  °[facility]  - variety of support groups  336- 373-1402 Call for more information  °Narcotics Anonymous (NA), [facility] [address], °[place]  2 meetings at this location  ° °  Residential Treatment Programs Organization         Address  Phone  Notes  [facility] [address],    [place]  [phone]   [facility]  [address], [place] [phone], [place], [place] [phone]   [facility] [address], [place] [phone] Admissions: 8am-3pm M-F  [facility] [address]    [place], [place] [phone]([phone]) [phone]   [facility] [address], [place], [place] [phone]   [facility] [address]  [place], [place] [phone]   [facility] Intensive Outpatient [address], [place], [place] [phone]   [facility] [address]  [place], [place] [phone] or [phone]) [phone]   [facility] ([facility]) [address] [place], [place] [phone] Accepts Medicaid  [facility] [address]  [place] [phone] Substance Abuse/Addiction Treatment   [facility]         Address  Phone  Notes  [facility]  [phone](888) [phone]   Angie FavaJulie Brannon, PhD [address], [address] [place], [place]   [phone](336) ([phone] or [phone](336) [phone]   [facility]   [address] [place], [place] [phone](336) [phone]   [facility] [address], [place], [place] [phone](336) [phone] Insurance/Medicaid/sponsorship through [facility] [address]                                    [place], [place] [phone]) [phone] Therapy/tele-psych/case    [facility] [address]   [place], [place] [phone](336) [phone]    Dr. Lolly MustacheArfeen  [phone](336) [phone]   [facility]  [employer] [facility] 1) [address], [place] 2) [address], [place] 3)  [address], [place] [phone](336) [phone](336) [phone]  ([phone]) [phone](336) [phone]   [facility] Child Abuse Hotline 8086820012(336) (438)[phone] or [phone](336) ([phone])[phone] (After Hours)      Take the prescriptions as directed.  Increase your fluid intake (ie:  Gatoraide) for the next few days, as discussed.  Eat a bland diet and advance to your regular diet slowly as you can tolerate it.   Avoid full strength juices, as well as milk and milk products until your diarrhea has resolved.   Call your regular medical doctor tomorrow to schedule a follow up appointment this week.  Return to the Emergency Department immediately if not improving (or even worsening) despite taking the medicines as prescribed, any black or bloody stool or vomit, if you develop a fever over "101," or for any other concerns.

## 2014-06-08 NOTE — ED Notes (Signed)
Patient complaining of abdominal pain with diarrhea and nausea starting last night.

## 2014-06-08 NOTE — ED Provider Notes (Signed)
CSN: [id number]     Arrival date & time 06/08/14  1446 History   First MD Initiated Contact with Patient 06/08/14 1747     Chief Complaint  Patient presents with  . Abdominal Pain  . Diarrhea      HPI Pt was seen at 1755. Per pt, c/o gradual onset and persistence of multiple intermittent episodes of N/V/D that began yesterday.  Describes the stools as "watery." Has been associated with generalized abd "cramping." Denies abd pain, no CP/SOB, no back pain, no fevers, no black or blood in stools or emesis. Others in household with similar symptoms.    Past Medical History  Diagnosis Date  . Hypertension   . Bronchitis   . 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 No pertinent past surgical history.  History  Substance Use Topics  . Smoking status: Current Every Day Smoker -- 0.50 packs/day    Types: Cigarettes  . Smokeless tobacco: Not on file  . Alcohol Use: Yes     Comment: occ    Review of Systems ROS: Statement: All systems negative except as marked or noted in the HPI; Constitutional: Negative for fever and chills. ; ; Eyes: Negative for eye pain, redness and discharge. ; ; ENMT: Negative for ear pain, hoarseness, nasal congestion, sinus pressure and sore throat. ; ; Cardiovascular: Negative for chest pain, palpitations, diaphoresis, dyspnea and peripheral edema. ; ; Respiratory: Negative for cough, wheezing and stridor. ; ; Gastrointestinal: +N/V/D. Negative for abdominal pain, blood in stool, hematemesis, jaundice and rectal bleeding. . ; ; Genitourinary: Negative for dysuria, flank pain and hematuria. ; ; Musculoskeletal: Negative for back pain and neck pain. Negative for swelling and trauma.; ; Skin: Negative for pruritus, rash, abrasions, blisters, bruising and skin lesion.; ; Neuro: Negative for headache, lightheadedness and neck stiffness. Negative for weakness, altered level of consciousness , altered mental status, extremity weakness, paresthesias, involuntary  movement, seizure and syncope.        Allergies  Hydrocodone; Naproxen; and Tramadol  Home Medications   Prior to Admission medications   Medication Sig Start Date End Date Taking? Authorizing Provider  amLODipine (NORVASC) 10 MG tablet Take 10 mg by mouth daily.   Yes Historical Provider, MD  lisinopril-hydrochlorothiazide (PRINZIDE,ZESTORETIC) 10-12.5 MG per tablet Take 1 tablet by mouth daily.   Yes Historical Provider, MD  dicyclomine (BENTYL) 20 MG tablet Take 1 tablet (20 mg total) by mouth every 6 (six) hours as needed for spasms (abdominal cramping). 06/08/14   Samuel Jester, DO  ondansetron (ZOFRAN) 4 MG tablet Take 1 tablet (4 mg total) by mouth every 8 (eight) hours as needed for nausea or vomiting. 06/08/14   Samuel Jester, DO  oxyCODONE-acetaminophen (PERCOCET/ROXICET) 5-325 MG per tablet Take 1 tablet by mouth at bedtime as needed for moderate pain. Patient not taking: Reported on 04/27/2014 03/31/14   Burgess Amor, PA-C  promethazine-codeine (PHENERGAN WITH CODEINE) 6.25-10 MG/5ML syrup Take 5 mLs by mouth every 4 (four) hours as needed for cough. Patient not taking: Reported on 06/08/2014 04/28/14   Burgess Amor, PA-C  sulfamethoxazole-trimethoprim (SEPTRA DS) 800-160 MG per tablet Take 1 tablet by mouth every 12 (twelve) hours. Patient not taking: Reported on 04/27/2014 03/31/14   Burgess Amor, PA-C   BP 146/83 mmHg  Pulse 88  Temp(Src) 98.9 F (37.2 C) (Oral)  Resp 18  Ht  (1.753 m)  Wt 300 lb (136.079 kg)  BMI 44.28 kg/m2  SpO2 97% Physical Exam  1800: Physical examination:  Nursing notes reviewed; Vital signs and O2 SAT reviewed;  Constitutional: Well developed, Well nourished, Well hydrated, In no acute distress; Head:  Normocephalic, atraumatic; Eyes: EOMI, PERRL, No scleral icterus; ENMT: Mouth and pharynx normal, Mucous membranes moist; Neck: Supple, Full range of motion, No lymphadenopathy; Cardiovascular: Regular rate and rhythm, No murmur, rub, or gallop;  Respiratory: Breath sounds clear & equal bilaterally, No rales, rhonchi, wheezes.  Speaking full sentences with ease, Normal respiratory effort/excursion; Chest: Nontender, Movement normal; Abdomen: Soft, Nontender, Nondistended, Normal bowel sounds; Genitourinary: No CVA tenderness; Extremities: Pulses normal, No tenderness, No edema, No calf edema or asymmetry.; Neuro: AA&Ox3, Major CN grossly intact.  Speech clear. No gross focal motor or sensory deficits in extremities. Climbs on and off stretcher easily by herself. Gait steady.; Skin: Color normal, Warm, Dry.   ED Course  Procedures     EKG Interpretation None      MDM  MDM Reviewed: previous chart, nursing note and vitals Reviewed previous: labs Interpretation: labs      Results for orders placed or performed during the hospital encounter of 06/08/14  Comprehensive metabolic panel  Result Value Ref Range   Sodium 137 135 - 145 mmol/L   Potassium 3.8 3.5 - 5.1 mmol/L   Chloride 109 96 - 112 mmol/L   CO2 24 19 - 32 mmol/L   Glucose, Bld 96 70 - 99 mg/dL   BUN 11 6 - 23 mg/dL   Creatinine, Ser 1.61 0.50 - 1.10 mg/dL   Calcium 9.2 8.4 - 09.6 mg/dL   Total Protein 7.7 6.0 - 8.3 g/dL   Albumin 3.7 3.5 - 5.2 g/dL   AST 21 0 - 37 U/L   ALT 27 0 - 35 U/L   Alkaline Phosphatase 106 39 - 117 U/L   Total Bilirubin 0.4 0.3 - 1.2 mg/dL   GFR calc non Af Amer >90 >90 mL/min   GFR calc Af Amer >90 >90 mL/min   Anion gap 4 (L) 5 - 15  CBC with Differential  Result Value Ref Range   WBC 19.4 (H) 4.0 - 10.5 K/uL   RBC 4.98 3.87 - 5.11 MIL/uL   Hemoglobin 13.9 12.0 - 15.0 g/dL   HCT 04.5 40.9 - 81.1 %   MCV 83.5 78.0 - 100.0 fL   MCH 27.9 26.0 - 34.0 pg   MCHC 33.4 30.0 - 36.0 g/dL   RDW 91.4 78.2 - 95.6 %   Platelets 429 (H) 150 - 400 K/uL   Neutrophils Relative % 83 (H) 43 - 77 %   Neutro Abs 16.0 (H) 1.7 - 7.7 K/uL   Lymphocytes Relative 9 (L) 12 - 46 %   Lymphs Abs 1.7 0.7 - 4.0 K/uL   Monocytes Relative 7 3 - 12 %    Monocytes Absolute 1.4 (H) 0.1 - 1.0 K/uL   Eosinophils Relative 1 0 - 5 %   Eosinophils Absolute 0.3 0.0 - 0.7 K/uL   Basophils Relative 0 0 - 1 %   Basophils Absolute 0.0 0.0 - 0.1 K/uL  Lipase, blood  Result Value Ref Range   Lipase 17 11 - 59 U/L    2015:  Labs per pt's baseline. Pt has tol PO well while in the ED without N/V.  No stooling while in the ED.  Abd remains benign, VSS. Feels better and wants to go home now. Dx and testing d/w pt and friend.  Questions answered.  Verb understanding, agreeable to d/c home with outpt f/u.  Samuel JesterKathleen Jemma Rasp, DO 06/11/14 [phone]

## 2014-06-08 NOTE — ED Notes (Signed)
Fluid challenge completed, pt had no problems. Spoke with EDP about urine, may cancel order.

## 2014-08-30 ENCOUNTER — Emergency Department (HOSPITAL_COMMUNITY)
Admission: EM | Admit: 2014-08-30 | Discharge: 2014-08-30 | Disposition: A | Discharge status: Home / Self Care | Payer: Self-pay | Attending: Emergency Medicine | Admitting: Emergency Medicine

## 2014-08-30 ENCOUNTER — Encounter (HOSPITAL_COMMUNITY): Payer: Self-pay | Admitting: *Deleted

## 2014-08-30 DIAGNOSIS — I10 Essential (primary) hypertension: Secondary | ICD-10-CM | POA: Insufficient documentation

## 2014-08-30 DIAGNOSIS — Z79899 Other long term (current) drug therapy: Secondary | ICD-10-CM | POA: Insufficient documentation

## 2014-08-30 DIAGNOSIS — Z8709 Personal history of other diseases of the respiratory system: Secondary | ICD-10-CM | POA: Insufficient documentation

## 2014-08-30 DIAGNOSIS — L0291 Cutaneous abscess, unspecified: Secondary | ICD-10-CM

## 2014-08-30 DIAGNOSIS — L0390 Cellulitis, unspecified: Secondary | ICD-10-CM

## 2014-08-30 DIAGNOSIS — N61 Inflammatory disorders of breast: Secondary | ICD-10-CM | POA: Insufficient documentation

## 2014-08-30 DIAGNOSIS — Z720 Tobacco use: Secondary | ICD-10-CM | POA: Insufficient documentation

## 2014-08-30 DIAGNOSIS — Z862 Personal history of diseases of the blood and blood-forming organs and certain disorders involving the immune mechanism: Secondary | ICD-10-CM | POA: Insufficient documentation

## 2014-08-30 MED ORDER — SULFAMETHOXAZOLE-TRIMETHOPRIM 800-160 MG PO TABS
1.0000 | ORAL_TABLET | Freq: Once | ORAL | Status: AC
  Administered 2014-08-30: 1 via ORAL
  Filled 2014-08-30: qty 1

## 2014-08-30 MED ORDER — MUPIROCIN 2 % EX OINT: TOPICAL_OINTMENT | CUTANEOUS | Status: DC

## 2014-08-30 MED ORDER — IBUPROFEN 800 MG PO TABS
800.0000 mg | ORAL_TABLET | Freq: Once | ORAL | Status: AC
  Administered 2014-08-30: 800 mg via ORAL
  Filled 2014-08-30: qty 1

## 2014-08-30 MED ORDER — SULFAMETHOXAZOLE-TRIMETHOPRIM 800-160 MG PO TABS: 1.0000 | ORAL_TABLET | Freq: Two times a day (BID) | ORAL | Status: AC

## 2014-08-30 NOTE — ED Provider Notes (Signed)
CSN: [id number]     Arrival date & time 08/30/14  2222 History   First MD Initiated Contact with Patient 08/30/14 2235     No chief complaint on file.    (Consider location/radiation/quality/duration/timing/severity/associated sxs/prior Treatment) HPI   Norma Nelson is a 29 y.o. female who presents to the Emergency Department complaining of  tenderness, redness, and "bump" to the left breast.  she reports hx of multiple boils and concerned this is same.  She states the area is tender with palpation.  She denies fever, vomiting, chills, nipple discharge or pain to the nipple.  Nothing has made her symptoms better or worse.  Denies hx of diabetes.  Past Medical History  Diagnosis Date  . Hypertension   . Bronchitis   . 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 No pertinent past surgical history. History reviewed. No pertinent family history. History  Substance Use Topics  . Smoking status: Current Every Day Smoker -- 0.50 packs/day    Types: Cigarettes  . Smokeless tobacco: Not on file  . Alcohol Use: Yes     Comment: occ   OB History    No data available     Review of Systems  Constitutional: Negative for fever and chills.  Respiratory: Negative for shortness of breath.   Gastrointestinal: Negative for nausea, vomiting and abdominal pain.  Musculoskeletal: Negative for joint swelling and arthralgias.  Skin: Positive for color change.       Abscess left breast  Hematological: Negative for adenopathy.  All other systems reviewed and are negative.     Allergies  Hydrocodone; Naproxen; and Tramadol  Home Medications   Prior to Admission medications   Medication Sig Start Date End Date Taking? Authorizing Provider  amLODipine (NORVASC) 10 MG tablet Take 10 mg by mouth daily.   Yes Historical Provider, MD  lisinopril-hydrochlorothiazide (PRINZIDE,ZESTORETIC) 10-12.5 MG per tablet Take 1 tablet by mouth daily.   Yes Historical Provider, MD  dicyclomine  (BENTYL) 20 MG tablet Take 1 tablet (20 mg total) by mouth every 6 (six) hours as needed for spasms (abdominal cramping). Patient not taking: Reported on 08/30/2014 06/08/14   Samuel JesterKathleen McManus, DO  ondansetron (ZOFRAN) 4 MG tablet Take 1 tablet (4 mg total) by mouth every 8 (eight) hours as needed for nausea or vomiting. Patient not taking: Reported on 08/30/2014 06/08/14   Samuel JesterKathleen McManus, DO  oxyCODONE-acetaminophen (PERCOCET/ROXICET) 5-325 MG per tablet Take 1 tablet by mouth at bedtime as needed for moderate pain. Patient not taking: Reported on 04/27/2014 03/31/14   Burgess AmorJulie Idol, PA-C  promethazine-codeine (PHENERGAN WITH CODEINE) 6.25-10 MG/5ML syrup Take 5 mLs by mouth every 4 (four) hours as needed for cough. Patient not taking: Reported on 06/08/2014 04/28/14   Burgess AmorJulie Idol, PA-C  sulfamethoxazole-trimethoprim (SEPTRA DS) 800-160 MG per tablet Take 1 tablet by mouth every 12 (twelve) hours. Patient not taking: Reported on 04/27/2014 03/31/14   Burgess AmorJulie Idol, PA-C   BP 152/92 mmHg  Pulse 88  Temp(Src) 98.4 F (36.9 C) (Oral)  Resp 18  Ht 5\' 11"  (1.803 m)  Wt 300 lb (136.079 kg)  BMI 41.86 kg/m2  SpO2 100%  LMP 08/02/2014 Physical Exam  Constitutional: She is oriented to person, place, and time. She appears well-developed and well-nourished. No distress.  HENT:  Head: Normocephalic and atraumatic.  Neck: Normal range of motion. Neck supple.  Cardiovascular: Normal rate, regular rhythm, normal heart sounds and intact distal pulses.   No murmur heard. Pulmonary/Chest: Effort normal and breath  sounds normal. No respiratory distress.  Musculoskeletal: Normal range of motion.  Lymphadenopathy:    She has no cervical adenopathy.  Neurological: She is alert and oriented to person, place, and time. She exhibits normal muscle tone. Coordination normal.  Skin: Skin is warm and dry. No rash noted. There is erythema.  Dime sized abscess to the medial left breast with moderate surrounding erythema.  No  drainage.  Small amt of induration.  Nursing note and vitals reviewed.   ED Course  Procedures (including critical care time) Labs Review Labs Reviewed - No data to display  Imaging Review No results found.   EKG Interpretation None      MDM   Final diagnoses:  Abscess and cellulitis    Pt with dime sized abscess to the medial left breast with surrounding erythema.  No fluctuance or drainage.  Hx of same.  Patient recommended to have I&D, but declines.  Agrees to return here in 2-3 days if symptoms are not improving.  Will Rx Bactrim and Bactroban.      Rosey Bathammy Makani Seckman, PA-C 09/01/14 2100  Raeford RazorStephen Kohut, MD 09/02/14 [id number]

## 2014-08-30 NOTE — Discharge Instructions (Signed)
Abscess °An abscess (boil or furuncle) is an infected area on or under the skin. This area is filled with yellowish-white fluid (pus) and other material (debris). °HOME CARE  °· Only take medicines as told by your doctor. °· If you were given antibiotic medicine, take it as directed. Finish the medicine even if you start to feel better. °· If gauze is used, follow your doctor's directions for changing the gauze. °· To avoid spreading the infection: °¨ Keep your abscess covered with a bandage. °¨ Wash your hands well. °¨ Do not share personal care items, towels, or whirlpools with others. °¨ Avoid skin contact with others. °· Keep your skin and clothes clean around the abscess. °· Keep all doctor visits as told. °GET HELP RIGHT AWAY IF:  °· You have more pain, puffiness (swelling), or redness in the wound site. °· You have more fluid or blood coming from the wound site. °· You have muscle aches, chills, or you feel sick. °· You have a fever. °MAKE SURE YOU:  °· Understand these instructions. °· Will watch your condition. °· Will get help right away if you are not doing well or get worse. °Document Released: 09/25/2007 Document Revised: 10/08/2011 Document Reviewed: 06/21/2011 °[employer]® Patient Information ©2015 [employer]. This information is not intended to replace advice given to you by your health care provider. Make sure you discuss any questions you have with your health care provider. ° °

## 2014-08-30 NOTE — ED Notes (Signed)
Swelling, redness lt breast

## 2015-04-18 ENCOUNTER — Encounter (HOSPITAL_COMMUNITY): Payer: Self-pay | Admitting: *Deleted

## 2015-04-18 ENCOUNTER — Emergency Department (HOSPITAL_COMMUNITY)
Admission: EM | Admit: 2015-04-18 | Discharge: 2015-04-18 | Disposition: A | Discharge status: Home / Self Care | Payer: Self-pay | Attending: Emergency Medicine | Admitting: Emergency Medicine

## 2015-04-18 ENCOUNTER — Emergency Department (HOSPITAL_COMMUNITY): Payer: Self-pay

## 2015-04-18 DIAGNOSIS — Z862 Personal history of diseases of the blood and blood-forming organs and certain disorders involving the immune mechanism: Secondary | ICD-10-CM | POA: Insufficient documentation

## 2015-04-18 DIAGNOSIS — Z79899 Other long term (current) drug therapy: Secondary | ICD-10-CM | POA: Insufficient documentation

## 2015-04-18 DIAGNOSIS — I10 Essential (primary) hypertension: Secondary | ICD-10-CM | POA: Insufficient documentation

## 2015-04-18 DIAGNOSIS — J209 Acute bronchitis, unspecified: Secondary | ICD-10-CM | POA: Insufficient documentation

## 2015-04-18 DIAGNOSIS — F17210 Nicotine dependence, cigarettes, uncomplicated: Secondary | ICD-10-CM | POA: Insufficient documentation

## 2015-04-18 DIAGNOSIS — M791 Myalgia: Secondary | ICD-10-CM | POA: Insufficient documentation

## 2015-04-18 MED ORDER — PROMETHAZINE-CODEINE 6.25-10 MG/5ML PO SYRP: 5.0000 mL | ORAL_SOLUTION | ORAL | Status: DC | PRN

## 2015-04-18 MED ORDER — ALBUTEROL SULFATE HFA 108 (90 BASE) MCG/ACT IN AERS
2.0000 | INHALATION_SPRAY | RESPIRATORY_TRACT | Status: DC | PRN
  Administered 2015-04-18: 2 via RESPIRATORY_TRACT
  Filled 2015-04-18: qty 6.7

## 2015-04-18 MED ORDER — PROMETHAZINE-CODEINE 6.25-10 MG/5ML PO SYRP
5.0000 mL | ORAL_SOLUTION | Freq: Once | ORAL | Status: AC
  Administered 2015-04-18: 5 mL via ORAL
  Filled 2015-04-18: qty 5

## 2015-04-18 NOTE — Discharge Instructions (Signed)
How to Use an Inhaler °Proper inhaler technique is very important. Good technique ensures that the medicine reaches the lungs. Poor technique results in depositing the medicine on the tongue and back of the throat rather than in the airways. If you do not use the inhaler with good technique, the medicine will not help you. °STEPS TO FOLLOW IF USING AN INHALER WITHOUT AN EXTENSION TUBE °· Remove the cap from the inhaler. °· If you are using the inhaler for the first time, you will need to prime it. Shake the inhaler for 5 seconds and release four puffs into the air, away from your face. Ask your health care provider or pharmacist if you have questions about priming your inhaler. °· Shake the inhaler for 5 seconds before each breath in (inhalation). °· Position the inhaler so that the top of the canister faces up. °· Put your index finger on the top of the medicine canister. Your thumb supports the bottom of the inhaler. °· Open your mouth. °· Either place the inhaler between your teeth and place your lips tightly around the mouthpiece, or hold the inhaler 1-2 inches away from your open mouth. If you are unsure of which technique to use, ask your health care provider. °· Breathe out (exhale) normally and as completely as possible. °· Press the canister down with your index finger to release the medicine. °· At the same time as the canister is pressed, inhale deeply and slowly until your lungs are completely filled. This should take 4-6 seconds. Keep your tongue down. °· Hold the medicine in your lungs for 5-10 seconds (10 seconds is best). This helps the medicine get into the small airways of your lungs. °· Breathe out slowly, through pursed lips. Whistling is an example of pursed lips. °· Wait at least 15-30 seconds between puffs. Continue with the above steps until you have taken the number of puffs your health care provider has ordered. Do not use the inhaler more than your health care provider tells  you. °· Replace the cap on the inhaler. °· Follow the directions from your health care provider or the inhaler insert for cleaning the inhaler. °STEPS TO FOLLOW IF USING AN INHALER WITH AN EXTENSION (SPACER) °· Remove the cap from the inhaler. °· If you are using the inhaler for the first time, you will need to prime it. Shake the inhaler for 5 seconds and release four puffs into the air, away from your face. Ask your health care provider or pharmacist if you have questions about priming your inhaler. °· Shake the inhaler for 5 seconds before each breath in (inhalation). °· Place the open end of the spacer onto the mouthpiece of the inhaler. °· Position the inhaler so that the top of the canister faces up and the spacer mouthpiece faces you. °· Put your index finger on the top of the medicine canister. Your thumb supports the bottom of the inhaler and the spacer. °· Breathe out (exhale) normally and as completely as possible. °· Immediately after exhaling, place the spacer between your teeth and into your mouth. Close your lips tightly around the spacer. °· Press the canister down with your index finger to release the medicine. °· At the same time as the canister is pressed, inhale deeply and slowly until your lungs are completely filled. This should take 4-6 seconds. Keep your tongue down and out of the way. °· Hold the medicine in your lungs for 5-10 seconds (10 seconds is best). This helps the   medicine get into the small airways of your lungs. Exhale. °· Repeat inhaling deeply through the spacer mouthpiece. Again hold that breath for up to 10 seconds (10 seconds is best). Exhale slowly. If it is difficult to take this second deep breath through the spacer, breathe normally several times through the spacer. Remove the spacer from your mouth. °· Wait at least 15-30 seconds between puffs. Continue with the above steps until you have taken the number of puffs your health care provider has ordered. Do not use the  inhaler more than your health care provider tells you. °· Remove the spacer from the inhaler, and place the cap on the inhaler. °· Follow the directions from your health care provider or the inhaler insert for cleaning the inhaler and spacer. °If you are using different kinds of inhalers, use your quick relief medicine to open the airways 10-15 minutes before using a steroid if instructed to do so by your health care provider. If you are unsure which inhalers to use and the order of using them, ask your health care provider, nurse, or respiratory therapist. °If you are using a steroid inhaler, always rinse your mouth with water after your last puff, then gargle and spit out the water. Do not swallow the water. °AVOID: °· Inhaling before or after starting the spray of medicine. It takes practice to coordinate your breathing with triggering the spray. °· Inhaling through the nose (rather than the mouth) when triggering the spray. °HOW TO DETERMINE IF YOUR INHALER IS FULL OR NEARLY EMPTY °You cannot know when an inhaler is empty by shaking it. A few inhalers are now being made with dose counters. Ask your health care provider for a prescription that has a dose counter if you feel you need that extra help. If your inhaler does not have a counter, ask your health care provider to help you determine the date you need to refill your inhaler. Write the refill date on a calendar or your inhaler canister. Refill your inhaler 7-10 days before it runs out. Be sure to keep an adequate supply of medicine. This includes making sure it is not expired, and that you have a spare inhaler.  °SEEK MEDICAL CARE IF:  °· Your symptoms are only partially relieved with your inhaler. °· You are having trouble using your inhaler. °· You have some increase in phlegm. °SEEK IMMEDIATE MEDICAL CARE IF:  °· You feel little or no relief with your inhalers. You are still wheezing and are feeling shortness of breath or tightness in your chest or  both. °· You have dizziness, headaches, or a fast heart rate. °· You have chills, fever, or night sweats. °· You have a noticeable increase in phlegm production, or there is blood in the phlegm. °MAKE SURE YOU:  °· Understand these instructions. °· Will watch your condition. °· Will get help right away if you are not doing well or get worse. °  °This information is not intended to replace advice given to you by your health care provider. Make sure you discuss any questions you have with your health care provider. °  °Document Released: 04/05/2000 Document Revised: 01/27/2013 Document Reviewed: 11/05/2012 °Elsevier Interactive Patient Education ©2016 [employer] °Acute Bronchitis °Bronchitis is inflammation of the airways that extend from the windpipe into the lungs (bronchi). The inflammation often causes mucus to develop. This leads to a cough, which is the most common symptom of bronchitis.  °In acute bronchitis, the condition usually develops suddenly and goes away   over time, usually in a couple weeks. Smoking, allergies, and asthma can make bronchitis worse. Repeated episodes of bronchitis may cause further lung problems.  °CAUSES °Acute bronchitis is most often caused by the same virus that causes a cold. The virus can spread from person to person (contagious) through coughing, sneezing, and touching contaminated objects. °SIGNS AND SYMPTOMS  °· Cough.   °· Fever.   °· Coughing up mucus.   °· Body aches.   °· Chest congestion.   °· Chills.   °· Shortness of breath.   °· Sore throat.   °DIAGNOSIS  °Acute bronchitis is usually diagnosed through a physical exam. Your health care provider will also ask you questions about your medical history. Tests, such as chest X-rays, are sometimes done to rule out other conditions.  °TREATMENT  °Acute bronchitis usually goes away in a couple weeks. Oftentimes, no medical treatment is necessary. Medicines are sometimes given for relief of fever or cough. Antibiotic medicines  are usually not needed but may be prescribed in certain situations. In some cases, an inhaler may be recommended to help reduce shortness of breath and control the cough. A cool mist vaporizer may also be used to help thin bronchial secretions and make it easier to clear the chest.  °HOME CARE INSTRUCTIONS °· Get plenty of rest.   °· Drink enough fluids to keep your urine clear or pale yellow (unless you have a medical condition that requires fluid restriction). Increasing fluids may help thin your respiratory secretions (sputum) and reduce chest congestion, and it will prevent dehydration.   °· Take medicines only as directed by your health care provider. °· If you were prescribed an antibiotic medicine, finish it all even if you start to feel better. °· Avoid smoking and secondhand smoke. Exposure to cigarette smoke or irritating chemicals will make bronchitis worse. If you are a smoker, consider using nicotine gum or skin patches to help control withdrawal symptoms. Quitting smoking will help your lungs heal faster.   °· Reduce the chances of another bout of acute bronchitis by washing your hands frequently, avoiding people with cold symptoms, and trying not to touch your hands to your mouth, nose, or eyes.   °· Keep all follow-up visits as directed by your health care provider.   °SEEK MEDICAL CARE IF: °Your symptoms do not improve after 1 week of treatment.  °SEEK IMMEDIATE MEDICAL CARE IF: °· You develop an increased fever or chills.   °· You have chest pain.   °· You have severe shortness of breath. °· You have bloody sputum.   °· You develop dehydration. °· You faint or repeatedly feel like you are going to pass out. °· You develop repeated vomiting. °· You develop a severe headache. °MAKE SURE YOU:  °· Understand these instructions. °· Will watch your condition. °· Will get help right away if you are not doing well or get worse. °  °This information is not intended to replace advice given to you by your  health care provider. Make sure you discuss any questions you have with your health care provider. °  °Document Released: 05/16/2004 Document Revised: 04/29/2014 Document Reviewed: 09/29/2012 °Elsevier Interactive Patient Education ©2016 [employer] ° °

## 2015-04-18 NOTE — ED Provider Notes (Signed)
CSN: [id number]     Arrival date & time 04/18/15  1526 History   First MD Initiated Contact with Patient 04/18/15 1634     Chief Complaint  Patient presents with  . Cough     (Consider location/radiation/quality/duration/timing/severity/associated sxs/prior Treatment) The history is provided by the patient.   Norma Nelson is a 29 y.o. female presenting with a one week history of cough productive of green sputum, nasal congestion with post nasal drip, sore throat, generalized body aches, particularly in her legs along with generalized fatigue.  She denies sob, chest pain, nausea, vomiting or diarrhea.  She endorses wheezing mostly occuring at night.  She has used mucinex and ibuprofen without relief of sx.  She is a 0.5 ppd smoker.     Past Medical History  Diagnosis Date  . Hypertension   . Bronchitis   . 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 No pertinent past surgical history. History reviewed. No pertinent family history. Social History  Substance Use Topics  . Smoking status: Current Every Day Smoker -- 0.50 packs/day    Types: Cigarettes  . Smokeless tobacco: None  . Alcohol Use: Yes     Comment: occ   OB History    No data available     Review of Systems  Constitutional: Negative for fever and chills.  HENT: Positive for congestion, rhinorrhea and sore throat. Negative for ear pain, sinus pressure, trouble swallowing and voice change.   Eyes: Negative for discharge.  Respiratory: Positive for cough and wheezing. Negative for shortness of breath and stridor.   Cardiovascular: Negative for chest pain.  Gastrointestinal: Negative for abdominal pain.  Genitourinary: Negative.   Musculoskeletal: Positive for myalgias.      Allergies  Hydrocodone; Naproxen; and Tramadol  Home Medications   Prior to Admission medications   Medication Sig Start Date End Date Taking? Authorizing Provider  amLODipine (NORVASC) 10 MG tablet Take 10 mg by mouth daily.     Historical Provider, MD  lisinopril-hydrochlorothiazide (PRINZIDE,ZESTORETIC) 10-12.5 MG per tablet Take 1 tablet by mouth daily.    Historical Provider, MD  mupirocin ointment (BACTROBAN) 2 % Apply to affected area TID x 10 days 08/30/14   Tammy Triplett, PA-C  promethazine-codeine (PHENERGAN WITH CODEINE) 6.25-10 MG/5ML syrup Take 5 mLs by mouth every 4 (four) hours as needed for cough. 04/18/15   Burgess Amor, PA-C   BP 138/52 mmHg  Pulse 78  Temp(Src) 98.8 F (37.1 C) (Oral)  Resp 18  Ht  (1.753 m)  Wt 104.327 kg  BMI 33.95 kg/m2  SpO2 100%  LMP 04/17/2015 Physical Exam  Constitutional: She is oriented to person, place, and time. She appears well-developed and well-nourished.  HENT:  Head: Normocephalic and atraumatic.  Right Ear: Tympanic membrane and ear canal normal.  Left Ear: Tympanic membrane and ear canal normal.  Nose: Mucosal edema and rhinorrhea present.  Mouth/Throat: Uvula is midline, oropharynx is clear and moist and mucous membranes are normal. No oropharyngeal exudate, posterior oropharyngeal edema, posterior oropharyngeal erythema or tonsillar abscesses.  Eyes: Conjunctivae are normal.  Cardiovascular: Normal rate and normal heart sounds.   Pulmonary/Chest: Effort normal. No respiratory distress. She has no wheezes. She has no rales.  Abdominal: Soft. There is no tenderness.  Musculoskeletal: Normal range of motion.  Neurological: She is alert and oriented to person, place, and time.  Skin: Skin is warm and dry. No rash noted.  Psychiatric: She has a normal mood and affect.  ED Course  Procedures (including critical care time) Labs Review Labs Reviewed - No data to display  Imaging Review Dg Chest 2 View  04/18/2015  CLINICAL DATA:  Productive cough for 1 week, hypertension EXAM: CHEST  2 VIEW COMPARISON:  04/27/2014 FINDINGS: Upper normal heart size. Mediastinal contours and pulmonary vascularity normal. Lungs clear. No pleural effusion or  pneumothorax. Bones unremarkable. IMPRESSION: No acute abnormalities. No interval change. Electronically Signed   By: Ulyses SouthwardMark  Boles M.D.   On: 04/18/2015 16:18   I have personally reviewed and evaluated these images and lab results as part of my medical decision-making.   EKG Interpretation None      MDM   Final diagnoses:  Acute bronchitis, unspecified organism    Pt with uri type sx, but with h/o wheezing.  Will provide with albuterol mdi for q 4 hours prn use.  Phenergan/codeine syrup for cough.  Rest, tylenol for body aches.  Prn f/u anticipated.    Burgess AmorJulie Babbie Dondlinger, PA-C 04/18/15 1704  Rolland PorterMark James, MD 04/29/15 [id number]

## 2015-04-18 NOTE — ED Notes (Signed)
Pt reports cough x 1 week, productive, sputum green in color.

## 2015-05-17 ENCOUNTER — Encounter (HOSPITAL_COMMUNITY): Payer: Self-pay | Admitting: *Deleted

## 2015-05-17 ENCOUNTER — Emergency Department (HOSPITAL_COMMUNITY)
Admission: EM | Admit: 2015-05-17 | Discharge: 2015-05-17 | Disposition: A | Discharge status: Home / Self Care | Payer: Self-pay | Attending: Emergency Medicine | Admitting: Emergency Medicine

## 2015-05-17 DIAGNOSIS — L089 Local infection of the skin and subcutaneous tissue, unspecified: Secondary | ICD-10-CM | POA: Insufficient documentation

## 2015-05-17 DIAGNOSIS — F17210 Nicotine dependence, cigarettes, uncomplicated: Secondary | ICD-10-CM | POA: Insufficient documentation

## 2015-05-17 DIAGNOSIS — R0602 Shortness of breath: Secondary | ICD-10-CM | POA: Insufficient documentation

## 2015-05-17 DIAGNOSIS — R059 Cough, unspecified: Secondary | ICD-10-CM

## 2015-05-17 DIAGNOSIS — Z8709 Personal history of other diseases of the respiratory system: Secondary | ICD-10-CM | POA: Insufficient documentation

## 2015-05-17 DIAGNOSIS — R0981 Nasal congestion: Secondary | ICD-10-CM | POA: Insufficient documentation

## 2015-05-17 DIAGNOSIS — I10 Essential (primary) hypertension: Secondary | ICD-10-CM | POA: Insufficient documentation

## 2015-05-17 DIAGNOSIS — Z79899 Other long term (current) drug therapy: Secondary | ICD-10-CM | POA: Insufficient documentation

## 2015-05-17 DIAGNOSIS — Z862 Personal history of diseases of the blood and blood-forming organs and certain disorders involving the immune mechanism: Secondary | ICD-10-CM | POA: Insufficient documentation

## 2015-05-17 DIAGNOSIS — R05 Cough: Secondary | ICD-10-CM | POA: Insufficient documentation

## 2015-05-17 MED ORDER — SULFAMETHOXAZOLE-TRIMETHOPRIM 800-160 MG PO TABS: 1.0000 | ORAL_TABLET | Freq: Two times a day (BID) | ORAL | Status: AC

## 2015-05-17 NOTE — ED Provider Notes (Signed)
CSN: [id number]     Arrival date & time 05/17/15  0108 History   First MD Initiated Contact with Patient 05/17/15 0125     Chief Complaint  Patient presents with  . Cough    Patient is a 30 y.o. female presenting with cough. The history is provided by the patient.  Cough Cough characteristics:  Productive Sputum characteristics:  Green Severity:  Moderate Onset quality:  Gradual Duration:  1 week Timing:  Intermittent Progression:  Unchanged Chronicity:  New Smoker: yes   Relieved by:  Nothing Worsened by:  Lying down Associated symptoms: shortness of breath   Associated symptoms: no chest pain and no fever   Patient reports cough for past week No hemoptysis No CP She reports chronic dyspnea on exertion that is unchanged No peripheral edema She reports congestion associated with cough   She also requests right breast evaluation as she thinks she might have an abscess   Past Medical History  Diagnosis Date  . Hypertension   . Bronchitis   . 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 No pertinent past surgical history. History reviewed. No pertinent family history. Social History  Substance Use Topics  . Smoking status: Current Every Day Smoker -- 0.50 packs/day    Types: Cigarettes  . Smokeless tobacco: None  . Alcohol Use: Yes     Comment: occ   OB History    No data available     Review of Systems  Constitutional: Negative for fever.  HENT: Positive for congestion.   Respiratory: Positive for cough and shortness of breath.   Cardiovascular: Negative for chest pain and leg swelling.  Skin: Positive for wound.  All other systems reviewed and are negative.     Allergies  Hydrocodone; Naproxen; and Tramadol  Home Medications   Prior to Admission medications   Medication Sig Start Date End Date Taking? Authorizing Provider  atenolol (TENORMIN) 25 MG tablet Take by mouth daily.   Yes Historical Provider, MD  amLODipine (NORVASC) 10 MG tablet  Take 10 mg by mouth daily.    Historical Provider, MD  sulfamethoxazole-trimethoprim (BACTRIM DS,SEPTRA DS) 800-160 MG tablet Take 1 tablet by mouth 2 (two) times daily. 05/17/15 05/24/15  Zadie Rhine, MD   BP 145/76 mmHg  Pulse 82  Temp(Src) 98 F (36.7 C) (Oral)  Resp 20  Ht  (1.803 m)  Wt 136.079 kg  BMI 41.86 kg/m2  SpO2 98%  LMP 04/17/2015 Physical Exam CONSTITUTIONAL: Well developed/well nourished HEAD: Normocephalic/atraumatic ENMT: Mucous membranes moist NECK: supple no meningeal signs SPINE/BACK:entire spine nontender CV: S1/S2 noted, no murmurs/rubs/gallops noted LUNGS: Lungs are clear to auscultation bilaterally, no apparent distress ABDOMEN: soft, nontender, obese NEURO: Pt is awake/alert/appropriate, moves all extremitiesx4.  No facial droop.   EXTREMITIES: pulses normal/equal, full ROM, no LE edema SKIN: warm, color normal, small abrasion with mild erythema to inner right breast, no crepitus/drainage/fluctuance. No mass noted.  No induration.  Nurse casey present for exam PSYCH: no abnormalities of mood noted, alert and oriented to situation  ED Course  Procedures  Pt with probable viral URI (cough/congestion, she is a smoker) I doubt ACS/PE/CHF at this time I advised her to quit smoking  She may have early cellulitis to right breast but may also just be an abrasion Given Rx for bactrim, told her that if not improved or worsened [id number] 24 hours to start ABX  MDM   Final diagnoses:  Skin infection  Cough    Nursing  notes including past medical history and social history reviewed and considered in documentation     Zadie Rhine, MD 05/17/15 0201

## 2015-05-17 NOTE — ED Notes (Signed)
PT states understanding of care given and follow up instructions.   

## 2015-05-17 NOTE — ED Notes (Signed)
Pt c/o cough x 1 week and boil under right breast that came up yesterday

## 2015-05-17 NOTE — Discharge Instructions (Signed)

## 2015-05-25 ENCOUNTER — Encounter (HOSPITAL_COMMUNITY): Payer: Self-pay | Admitting: Emergency Medicine

## 2015-05-25 ENCOUNTER — Emergency Department (HOSPITAL_COMMUNITY): Payer: Self-pay

## 2015-05-25 ENCOUNTER — Emergency Department (HOSPITAL_COMMUNITY)
Admission: EM | Admit: 2015-05-25 | Discharge: 2015-05-25 | Disposition: A | Discharge status: Home / Self Care | Payer: Self-pay | Attending: Emergency Medicine | Admitting: Emergency Medicine

## 2015-05-25 DIAGNOSIS — Z79899 Other long term (current) drug therapy: Secondary | ICD-10-CM | POA: Insufficient documentation

## 2015-05-25 DIAGNOSIS — F17210 Nicotine dependence, cigarettes, uncomplicated: Secondary | ICD-10-CM | POA: Insufficient documentation

## 2015-05-25 DIAGNOSIS — Z8709 Personal history of other diseases of the respiratory system: Secondary | ICD-10-CM | POA: Insufficient documentation

## 2015-05-25 DIAGNOSIS — S93401A Sprain of unspecified ligament of right ankle, initial encounter: Secondary | ICD-10-CM | POA: Insufficient documentation

## 2015-05-25 DIAGNOSIS — I10 Essential (primary) hypertension: Secondary | ICD-10-CM | POA: Insufficient documentation

## 2015-05-25 DIAGNOSIS — Y9289 Other specified places as the place of occurrence of the external cause: Secondary | ICD-10-CM | POA: Insufficient documentation

## 2015-05-25 DIAGNOSIS — Y998 Other external cause status: Secondary | ICD-10-CM | POA: Insufficient documentation

## 2015-05-25 DIAGNOSIS — W108XXA Fall (on) (from) other stairs and steps, initial encounter: Secondary | ICD-10-CM | POA: Insufficient documentation

## 2015-05-25 DIAGNOSIS — Z862 Personal history of diseases of the blood and blood-forming organs and certain disorders involving the immune mechanism: Secondary | ICD-10-CM | POA: Insufficient documentation

## 2015-05-25 DIAGNOSIS — Y9389 Activity, other specified: Secondary | ICD-10-CM | POA: Insufficient documentation

## 2015-05-25 MED ORDER — OXYCODONE-ACETAMINOPHEN 5-325 MG PO TABS: 1.0000 | ORAL_TABLET | Freq: Four times a day (QID) | ORAL | Status: DC | PRN

## 2015-05-25 MED ORDER — ACETAMINOPHEN 500 MG PO TABS: 1000.0000 mg | ORAL_TABLET | Freq: Once | ORAL | Status: DC

## 2015-05-25 MED ORDER — OXYCODONE-ACETAMINOPHEN 5-325 MG PO TABS
1.0000 | ORAL_TABLET | Freq: Once | ORAL | Status: AC
  Administered 2015-05-25: 1 via ORAL
  Filled 2015-05-25: qty 1

## 2015-05-25 NOTE — ED Notes (Signed)
Pt states that she fell up the stairs last night and right foot is now swollen and hurting.

## 2015-05-25 NOTE — Discharge Instructions (Signed)
Your x-ray is negative for fracture or dislocation. Please use the ankle splint for the next 10 days. Please see Dr. Romeo Apple or the orthopedist of your choice if not improving. Ankle Sprain An ankle sprain is an injury to the strong, fibrous tissues (ligaments) that hold your ankle bones together.  HOME CARE   Put ice on your ankle for 1-2 days or as told by your doctor.  Put ice in a plastic bag.  Place a towel between your skin and the bag.  Leave the ice on for 15-20 minutes at a time, every 2 hours while you are awake.  Only take medicine as told by your doctor.  Raise (elevate) your injured ankle above the level of your heart as much as possible for 2-3 days.  Use crutches if your doctor tells you to. Slowly put your own weight on the affected ankle. Use the crutches until you can walk without pain.  If you have a plaster splint:  Do not rest it on anything harder than a pillow for 24 hours.  Do not put weight on it.  Do not get it wet.  Take it off to shower or bathe.  If given, use an elastic wrap or support stocking for support. Take the wrap off if your toes lose feeling (numb), tingle, or turn cold or blue.  If you have an air splint:  Add or let out air to make it comfortable.  Take it off at night and to shower and bathe.  Wiggle your toes and move your ankle up and down often while you are wearing it. GET HELP IF:  You have rapidly increasing bruising or puffiness (swelling).  Your toes feel very cold.  You lose feeling in your foot.  Your medicine does not help your pain. GET HELP RIGHT AWAY IF:   Your toes lose feeling (numb) or turn blue.  You have severe pain that is increasing. MAKE SURE YOU:   Understand these instructions.  Will watch your condition.  Will get help right away if you are not doing well or get worse.   This information is not intended to replace advice given to you by your health care provider. Make sure you discuss any  questions you have with your health care provider.   Document Released: 09/25/2007 Document Revised: 04/29/2014 Document Reviewed: 10/21/2011 Elsevier Interactive Patient Education [employer]

## 2015-05-25 NOTE — ED Provider Notes (Signed)
CSN: [id number]     Arrival date & time 05/25/15  1844 History   First MD Initiated Contact with Patient 05/25/15 1951     Chief Complaint  Patient presents with  . Foot Injury     (Consider location/radiation/quality/duration/timing/severity/associated sxs/prior Treatment) Patient is a 30 y.o. female presenting with foot injury. The history is provided by the patient.  Foot Injury Location:  Foot Time since incident:  1 day Lower extremity injury: injured the right foot going up steps.   Foot location:  R foot Pain details:    Quality:  Aching   Severity:  Moderate   Onset quality:  Gradual   Duration:  1 day   Timing:  Intermittent   Progression:  Worsening Chronicity:  New Dislocation: no   Relieved by:  Nothing Worsened by:  Bearing weight Associated symptoms: no back pain, no neck pain, no numbness and no tingling   Risk factors: no frequent fractures     Past Medical History  Diagnosis Date  . Hypertension   . Bronchitis   . 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 No pertinent past surgical history. History reviewed. No pertinent family history. Social History  Substance Use Topics  . Smoking status: Current Every Day Smoker -- 0.50 packs/day    Types: Cigarettes  . Smokeless tobacco: None  . Alcohol Use: Yes     Comment: occ   OB History    No data available     Review of Systems  Constitutional: Negative for activity change.       All ROS Neg except as noted in HPI  HENT: Negative for nosebleeds.   Eyes: Negative for photophobia and discharge.  Respiratory: Negative for cough, shortness of breath and wheezing.   Cardiovascular: Negative for chest pain and palpitations.  Gastrointestinal: Negative for abdominal pain and blood in stool.  Genitourinary: Negative for dysuria, frequency and hematuria.  Musculoskeletal: Positive for arthralgias. Negative for back pain and neck pain.  Skin: Negative.   Neurological: Negative for dizziness,  seizures and speech difficulty.  Psychiatric/Behavioral: Negative for hallucinations and confusion.      Allergies  Hydrocodone; Naproxen; and Tramadol  Home Medications   Prior to Admission medications   Medication Sig Start Date End Date Taking? Authorizing Provider  amLODipine (NORVASC) 10 MG tablet Take 10 mg by mouth daily.    Historical Provider, MD  atenolol (TENORMIN) 25 MG tablet Take by mouth daily.    Historical Provider, MD   BP 129/101 mmHg  Pulse 73  Temp(Src) 97.8 F (36.6 C) (Oral)  Resp 20  Ht  (1.803 m)  Wt 136.079 kg  BMI 41.86 kg/m2  SpO2 97%  LMP 05/23/2015 Physical Exam  Constitutional: She is oriented to person, place, and time. She appears well-developed and well-nourished.  Non-toxic appearance.  HENT:  Head: Normocephalic.  Right Ear: Tympanic membrane and external ear normal.  Left Ear: Tympanic membrane and external ear normal.  Eyes: EOM and lids are normal. Pupils are equal, round, and reactive to light.  Neck: Normal range of motion. Neck supple. Carotid bruit is not present.  Cardiovascular: Normal rate, regular rhythm, normal heart sounds, intact distal pulses and normal pulses.   Pulmonary/Chest: Breath sounds normal. No respiratory distress.  Abdominal: Soft. Bowel sounds are normal. There is no tenderness. There is no guarding.  Musculoskeletal: Normal range of motion.       Right ankle: She exhibits swelling. She exhibits no deformity. Tenderness. Lateral malleolus tenderness  found.  Lymphadenopathy:       Head (right side): No submandibular adenopathy present.       Head (left side): No submandibular adenopathy present.    She has no cervical adenopathy.  Neurological: She is alert and oriented to person, place, and time. She has normal strength. No cranial nerve deficit or sensory deficit.  Skin: Skin is warm and dry.  Psychiatric: She has a normal mood and affect. Her speech is normal.  Nursing note and vitals  reviewed.   ED Course  Procedures (including critical care time) Labs Review Labs Reviewed - No data to display  Imaging Review Dg Foot Complete Right  05/25/2015  CLINICAL DATA:  Status post fall going up stairs yesterday with a right foot injury. Pain. Initial encounter. EXAM: RIGHT FOOT COMPLETE - 3+ VIEW COMPARISON:  None. FINDINGS: No acute bony or joint abnormality is identified. The second and third toes are splayed. Soft tissues are unremarkable. No evidence of arthropathy is seen. IMPRESSION: No acute abnormality. Electronically Signed   By: Drusilla Kanner M.D.   On: 05/25/2015 19:12   I have personally reviewed and evaluated these images and lab results as part of my medical decision-making.   EKG Interpretation None      MDM  X-ray of the right foot is negative for fracture or dislocation. The examination favors right ankle strain/sprain. The patient will be fitted with an ankle stirrup splint. The patient has listed that she is allergic to naproxen, as well as hydrocodone. She states however that she can take Percocet, and it does not affect her. The patient is fitted with an ankle stirrup splint, and given a prescription for 10 Percocet only. Patient is referred to orthopedics for additional evaluation and management.    Final diagnoses:  Ankle sprain, right, initial encounter    **I have reviewed nursing notes, vital signs, and all appropriate lab and imaging results for this patient.Ivery Quale, PA-C 05/25/15 2014  Eber Hong, MD 05/25/15 [phone]

## 2015-09-12 DIAGNOSIS — I10 Essential (primary) hypertension: Secondary | ICD-10-CM

## 2015-09-13 NOTE — Congregational Nurse Program (Signed)
Congregational Nurse Program Note  Date of Encounter: 09/12/2015  Past Medical History: Past Medical History  Diagnosis Date  . Hypertension   . Bronchitis   . Leukocytosis 2012    chronic    Encounter Details:     CNP Questionnaire - 09/12/15 1430    Patient Demographics   Is this a new or existing patient? New   Patient is considered a/an Not Applicable   Race African-American/Black   Patient Assistance   Location of Patient Assistance Not Applicable  [facility] [place]   Patient's financial/insurance status Low Income   Uninsured Patient Yes   Interventions Counseled to make appt. with provider;Assisted patient in making appt.   Patient referred to apply for the following financial assistance Not Applicable   Food insecurities addressed Not Applicable   Transportation assistance No   Assistance securing medications No   Educational health offerings Navigating the healthcare system;Hypertension   Encounter Details   Primary purpose of visit Chronic Illness/Condition Visit;Navigating the Healthcare System;Other   Was an Emergency Department visit averted? No   Does patient have a medical provider? Yes  [facility] but wishes to transfer due to distance.   Patient referred to Clinic;Area Agency;Establish PCP   Was a mental health screening completed? (GAINS tool) No   Was a mental health referral made? No   Does patient have dental issues? No   Does patient have vision issues? No   Does your patient have an abnormal blood pressure today? Yes  170/100 with current blood pressure medications.   Since previous encounter, have you referred patient for abnormal blood pressure that resulted in a new diagnosis or medication change? No   Does your patient have an abnormal blood glucose today? No   Since previous encounter, have you referred patient for abnormal blood glucose that resulted in a new  diagnosis or medication change? No   Was there a life-saving intervention made? No       New client to [facility]. Client has been going to the [facility] department being treated for hypertension. Blood pressure today 170/100 and she states she took her medication. She wishes to transfer to the free clinic due to distance and transportation. She currently lives with her mother off and on and helps her.  Current meds: Amlodipine 10 mg one daily, Atenolol 25 mg one daily. She also is being treated for eczema , but did not bring in that medication. Counseled client to bring all her medications to [facility] appointment with [facility].  Complains of pain in her left knee when going up steps and it occasionally has swelling. She denies any injury.  Denies drugs but reports ETOH use on "weekends of 2-3 drinks). Discussed with client the importance of smoking cessation with her blood pressure and the risks of smoking with hypertension with risk of stroke and heart attack and kidney function. Client states understanding . Also discussed client the benefits of exercise and weight loss and affect on her blood pressure as well as joints. Suggested once cleared by medical provider that exercise is okay to starat with walking and we discussed possible scholarships for joining the [facility] and use of the pool to decrease impact on joints , but benefits of aerobic exercise. Client states she is going to explore that option after she discusses it with her provider. Also discussed diet and she states she limits her salt intake  currently. Client denies depression or thoughts of suicide. Denies mental health needs at this time. [facility] into [facility] and first appointment scheduled asap due to blood pressure for 09/14/15 at 10:15 am. RN contact information given and will follow up as needed.  Client

## 2015-09-14 ENCOUNTER — Encounter: Payer: Self-pay | Admitting: Physician Assistant

## 2015-09-14 ENCOUNTER — Ambulatory Visit: Payer: Self-pay | Admitting: Physician Assistant

## 2015-09-14 VITALS — BP 144/68 | HR 79 | Temp 97.5°F | Ht 69.75 in | Wt 298.2 lb

## 2015-09-14 DIAGNOSIS — I10 Essential (primary) hypertension: Secondary | ICD-10-CM | POA: Insufficient documentation

## 2015-09-14 DIAGNOSIS — Z13220 Encounter for screening for lipoid disorders: Secondary | ICD-10-CM

## 2015-09-14 DIAGNOSIS — F17210 Nicotine dependence, cigarettes, uncomplicated: Secondary | ICD-10-CM | POA: Insufficient documentation

## 2015-09-14 DIAGNOSIS — Z131 Encounter for screening for diabetes mellitus: Secondary | ICD-10-CM

## 2015-09-14 LAB — GLUCOSE, POCT (MANUAL RESULT ENTRY): POC Glucose: 110 mg/dl — AB (ref 70–99)

## 2015-09-14 MED ORDER — AMLODIPINE BESYLATE 10 MG PO TABS: 10.0000 mg | ORAL_TABLET | Freq: Every day | ORAL | Status: DC

## 2015-09-14 MED ORDER — TRIAMCINOLONE ACETONIDE 0.1 % EX OINT: 1.0000 "application " | TOPICAL_OINTMENT | CUTANEOUS | Status: DC | PRN

## 2015-09-14 MED ORDER — ATENOLOL 50 MG PO TABS: 50.0000 mg | ORAL_TABLET | Freq: Every day | ORAL | Status: DC

## 2015-09-14 NOTE — Progress Notes (Signed)
BP 144/68 mmHg  Pulse 79  Temp(Src) 97.5 F (36.4 C)  Ht 5' 9.75" (1.772 m)  Wt 298 lb 4 oz (135.285 kg)  BMI 43.08 kg/m2  SpO2 92%  LMP 08/07/2015 (Exact Date)   Subjective:    Patient ID: Norma Nelson, female    DOB: 1985-10-20, 30 y.o.   MRN: [id number]  HPI: Norma Nelson is a 30 y.o. female presenting on 09/14/2015 for New Patient (Initial Visit) and Hypertension   HPI  Last PAP 2016  Pt feels well  Relevant past medical, surgical, family and social history reviewed and updated as indicated. Interim medical history since our last visit reviewed. Allergies and medications reviewed and updated.  CURRENT MEDS Amlodipine  qd Atenolol  qd TAC ointment 0.1 % prn     Review of Systems  Constitutional: Negative for fever, chills, diaphoresis, appetite change, fatigue and unexpected weight change.  HENT: Negative for congestion, dental problem, drooling, ear pain, facial swelling, hearing loss, mouth sores, sneezing, sore throat, trouble swallowing and voice change.   Eyes: Negative for pain, discharge, redness, itching and visual disturbance.  Respiratory: Negative for cough, choking, shortness of breath and wheezing.   Cardiovascular: Negative for chest pain, palpitations and leg swelling.  Gastrointestinal: Negative for vomiting, abdominal pain, diarrhea, constipation and blood in stool.  Endocrine: Negative for cold intolerance, heat intolerance and polydipsia.  Genitourinary: Negative for dysuria, hematuria and decreased urine volume.  Musculoskeletal: Negative for back pain, arthralgias and gait problem.  Skin: Negative for rash.  Allergic/Immunologic: Negative for environmental allergies.  Neurological: Negative for seizures, syncope, light-headedness and headaches.  Hematological: Negative for adenopathy.  Psychiatric/Behavioral: Negative for suicidal ideas, dysphoric mood and agitation. The patient is not nervous/anxious.     Per HPI unless  specifically indicated above     Objective:    BP 144/68 mmHg  Pulse 79  Temp(Src) 97.5 F (36.4 C)  Ht 5' 9.75" (1.772 m)  Wt 298 lb 4 oz (135.285 kg)  BMI 43.08 kg/m2  SpO2 92%  LMP 08/07/2015 (Exact Date)  Wt Readings from Last 3 Encounters:  09/14/15 298 lb 4 oz (135.285 kg)  05/25/15 300 lb (136.079 kg)  05/17/15 300 lb (136.079 kg)    Physical Exam  Constitutional: She is oriented to person, place, and time. She appears well-developed and well-nourished.  HENT:  Head: Normocephalic and atraumatic.  Mouth/Throat: Oropharynx is clear and moist. No oropharyngeal exudate.  Eyes: Conjunctivae and EOM are normal. Pupils are equal, round, and reactive to light.  Neck: Neck supple. No thyromegaly present.  Cardiovascular: Normal rate and regular rhythm.   Pulmonary/Chest: Effort normal and breath sounds normal.  Abdominal: Soft. Bowel sounds are normal. She exhibits no mass. There is no hepatosplenomegaly. There is no tenderness.  Musculoskeletal: She exhibits no edema.  Lymphadenopathy:    She has no cervical adenopathy.  Neurological: She is alert and oriented to person, place, and time. Gait normal.  Skin: Skin is warm and dry.  Psychiatric: She has a normal mood and affect. Her behavior is normal.  Vitals reviewed.   Results for orders placed or performed in visit on 09/14/15  POCT Glucose (CBG)  Result Value Ref Range   POC Glucose 110 (A) 70 - 99 mg/dl      Assessment & Plan:   Encounter Diagnoses  Name Primary?  . Essential hypertension, benign Yes  . Morbid obesity, unspecified obesity type (HCC)   . Cigarette nicotine dependence without complication   . Screening  for diabetes mellitus   . Screening cholesterol level     -Continue amlodipine.  Increase atenolol -[facility]pt to get Baseline labs drawn -f/u 1 month.  RTO sooner prn

## 2015-09-19 LAB — CBC WITH DIFFERENTIAL/PLATELET
Basophils Absolute: 0 cells/uL (ref 0–200)
Basophils Relative: 0 %
Eosinophils Absolute: 384 cells/uL (ref 15–500)
Eosinophils Relative: 2 %
HCT: 40.3 % (ref 35.0–45.0)
Hemoglobin: 13.1 g/dL (ref 11.7–15.5)
Lymphocytes Relative: 24 %
Lymphs Abs: 4608 cells/uL — ABNORMAL HIGH (ref 850–3900)
MCH: 27 pg (ref 27.0–33.0)
MCHC: 32.5 g/dL (ref 32.0–36.0)
MCV: 82.9 fL (ref 80.0–100.0)
MPV: 9.2 fL (ref 7.5–12.5)
Monocytes Absolute: 1536 cells/uL — ABNORMAL HIGH (ref 200–950)
Monocytes Relative: 8 %
Neutro Abs: 12672 cells/uL — ABNORMAL HIGH (ref 1500–7800)
Neutrophils Relative %: 66 %
Platelets: 456 10*3/uL — ABNORMAL HIGH (ref 140–400)
RBC: 4.86 MIL/uL (ref 3.80–5.10)
RDW: 15.2 % — ABNORMAL HIGH (ref 11.0–15.0)
WBC: 19.2 10*3/uL — ABNORMAL HIGH (ref 3.8–10.8)

## 2015-09-20 LAB — COMPLETE METABOLIC PANEL WITH GFR
ALT: 18 U/L (ref 6–29)
AST: 13 U/L (ref 10–30)
Albumin: 3.6 g/dL (ref 3.6–5.1)
Alkaline Phosphatase: 92 U/L (ref 33–115)
BUN: 8 mg/dL (ref 7–25)
CO2: 20 mmol/L (ref 20–31)
Calcium: 9.2 mg/dL (ref 8.6–10.2)
Chloride: 108 mmol/L (ref 98–110)
Creat: 0.85 mg/dL (ref 0.50–1.10)
GFR, Est African American: >89 mL/min (ref >=60)
GFR, Est Non African American: >89 mL/min (ref >=60)
Glucose, Bld: 95 mg/dL (ref 65–99)
Potassium: 3.9 mmol/L (ref 3.5–5.3)
Sodium: 139 mmol/L (ref 135–146)
Total Bilirubin: 0.2 mg/dL (ref 0.2–1.2)
Total Protein: 6.6 g/dL (ref 6.1–8.1)

## 2015-09-20 LAB — LIPID PANEL
Cholesterol: 120 mg/dL — ABNORMAL LOW (ref 125–200)
HDL: 35 mg/dL — ABNORMAL LOW (ref >=46)
LDL Cholesterol: 65 mg/dL (ref <130)
Total CHOL/HDL Ratio: 3.4 Ratio (ref <=5.0)
Triglycerides: 100 mg/dL (ref <150)
VLDL: 20 mg/dL (ref <30)

## 2015-09-20 LAB — HEMOGLOBIN A1C
Hgb A1c MFr Bld: 6.3 % — ABNORMAL HIGH (ref <5.7)
Mean Plasma Glucose: 134 mg/dL

## 2015-10-02 ENCOUNTER — Encounter (HOSPITAL_COMMUNITY): Payer: Self-pay

## 2015-10-02 ENCOUNTER — Emergency Department (HOSPITAL_COMMUNITY)
Admission: EM | Admit: 2015-10-02 | Discharge: 2015-10-03 | Disposition: A | Discharge status: Home / Self Care | Payer: Self-pay | Attending: Dermatology | Admitting: Dermatology

## 2015-10-02 DIAGNOSIS — F17210 Nicotine dependence, cigarettes, uncomplicated: Secondary | ICD-10-CM | POA: Insufficient documentation

## 2015-10-02 DIAGNOSIS — R51 Headache: Secondary | ICD-10-CM | POA: Insufficient documentation

## 2015-10-02 DIAGNOSIS — I10 Essential (primary) hypertension: Secondary | ICD-10-CM | POA: Insufficient documentation

## 2015-10-02 DIAGNOSIS — Z79899 Other long term (current) drug therapy: Secondary | ICD-10-CM | POA: Insufficient documentation

## 2015-10-02 DIAGNOSIS — Z5321 Procedure and treatment not carried out due to patient leaving prior to being seen by health care provider: Secondary | ICD-10-CM | POA: Insufficient documentation

## 2015-10-02 NOTE — ED Notes (Signed)
Headache started PTA. Patient took an extra BP pill and BP came down since being elevated at home.

## 2015-10-03 NOTE — ED Notes (Signed)
Registration states pt left 

## 2015-10-16 ENCOUNTER — Emergency Department (HOSPITAL_COMMUNITY)
Admission: EM | Admit: 2015-10-16 | Discharge: 2015-10-17 | Disposition: A | Discharge status: Home / Self Care | Payer: Self-pay | Attending: Emergency Medicine | Admitting: Emergency Medicine

## 2015-10-16 ENCOUNTER — Encounter (HOSPITAL_COMMUNITY): Payer: Self-pay | Admitting: Emergency Medicine

## 2015-10-16 ENCOUNTER — Ambulatory Visit: Payer: Self-pay | Admitting: Physician Assistant

## 2015-10-16 DIAGNOSIS — F17210 Nicotine dependence, cigarettes, uncomplicated: Secondary | ICD-10-CM | POA: Insufficient documentation

## 2015-10-16 DIAGNOSIS — M25562 Pain in left knee: Secondary | ICD-10-CM | POA: Insufficient documentation

## 2015-10-16 DIAGNOSIS — Z79899 Other long term (current) drug therapy: Secondary | ICD-10-CM | POA: Insufficient documentation

## 2015-10-16 DIAGNOSIS — N61 Mastitis without abscess: Secondary | ICD-10-CM | POA: Insufficient documentation

## 2015-10-16 DIAGNOSIS — I10 Essential (primary) hypertension: Secondary | ICD-10-CM | POA: Insufficient documentation

## 2015-10-16 NOTE — ED Notes (Signed)
Pt c/o left knee pain and denies any injury. Pt states she has abscess on rt breast.

## 2015-10-16 NOTE — ED Notes (Signed)
Pt complaining of left knee pain, denies any injury, no swelling noted. Pt has a red area to the right breast w/ noted warmness to the area. No drainage noted.

## 2015-10-17 ENCOUNTER — Emergency Department (HOSPITAL_COMMUNITY): Payer: Self-pay

## 2015-10-17 MED ORDER — DOXYCYCLINE HYCLATE 100 MG PO TABS
100.0000 mg | ORAL_TABLET | Freq: Once | ORAL | Status: AC
  Administered 2015-10-17: 100 mg via ORAL
  Filled 2015-10-17: qty 1

## 2015-10-17 MED ORDER — DOXYCYCLINE HYCLATE 100 MG PO CAPS: 100.0000 mg | ORAL_CAPSULE | Freq: Two times a day (BID) | ORAL | Status: DC

## 2015-10-17 NOTE — Discharge Instructions (Signed)
Wear the knee immobilizer as needed. Continue applying warm compresses 2 year breast. Take over-the-counter ibuprofen 600 mg (3 tablets) 3-4 times a day. Return to the emergency department if the breast lesion seems to be getting worse.  Cellulitis Cellulitis is an infection of the skin and the tissue beneath it. The infected area is usually red and tender. Cellulitis occurs most often in the arms and lower legs.  CAUSES  Cellulitis is caused by bacteria that enter the skin through cracks or cuts in the skin. The most common types of bacteria that cause cellulitis are staphylococci and streptococci. SIGNS AND SYMPTOMS   Redness and warmth.  Swelling.  Tenderness or pain.  Fever. DIAGNOSIS  Your health care provider can usually determine what is wrong based on a physical exam. Blood tests may also be done. TREATMENT  Treatment usually involves taking an antibiotic medicine. HOME CARE INSTRUCTIONS   Take your antibiotic medicine as directed by your health care provider. Finish the antibiotic even if you start to feel better.  Keep the infected arm or leg elevated to reduce swelling.  Apply a warm cloth to the affected area up to 4 times per day to relieve pain.  Take medicines only as directed by your health care provider.  Keep all follow-up visits as directed by your health care provider. SEEK MEDICAL CARE IF:   You notice red streaks coming from the infected area.  Your red area gets larger or turns dark in color.  Your bone or joint underneath the infected area becomes painful after the skin has healed.  Your infection returns in the same area or another area.  You notice a swollen bump in the infected area.  You develop new symptoms.  You have a fever. SEEK IMMEDIATE MEDICAL CARE IF:   You feel very sleepy.  You develop vomiting or diarrhea.  You have a general ill feeling (malaise) with muscle aches and pains.   This information is not intended to replace  advice given to you by your health care provider. Make sure you discuss any questions you have with your health care provider.   Document Released: 01/16/2005 Document Revised: 12/28/2014 Document Reviewed: 06/24/2011 Elsevier Interactive Patient Education 2016 Elsevier Inc.  Knee Pain Knee pain is a very common symptom and can have many causes. Knee pain often goes away when you follow your health care provider's instructions for relieving pain and discomfort at home. However, knee pain can develop into a condition that needs treatment. Some conditions may include:  Arthritis caused by wear and tear (osteoarthritis).  Arthritis caused by swelling and irritation (rheumatoid arthritis or gout).  A cyst or growth in your knee.  An infection in your knee joint.  An injury that will not heal.  Damage, swelling, or irritation of the tissues that support your knee (torn ligaments or tendinitis). If your knee pain continues, additional tests may be ordered to diagnose your condition. Tests may include X-rays or other imaging studies of your knee. You may also need to have fluid removed from your knee. Treatment for ongoing knee pain depends on the cause, but treatment may include:  Medicines to relieve pain or swelling.  Steroid injections in your knee.  Physical therapy.  Surgery. HOME CARE INSTRUCTIONS  Take medicines only as directed by your health care provider.  Rest your knee and keep it raised (elevated) while you are resting.  Do not do things that cause or worsen pain.  Avoid high-impact activities or exercises, such as  running, jumping rope, or doing jumping jacks.  Apply ice to the knee area:  Put ice in a plastic bag.  Place a towel between your skin and the bag.  Leave the ice on for 20 minutes, 2-3 times a day.  Ask your health care provider if you should wear an elastic knee support.  Keep a pillow under your knee when you sleep.  Lose weight if you are  overweight. Extra weight can put pressure on your knee.  Do not use any tobacco products, including cigarettes, chewing tobacco, or electronic cigarettes. If you need help quitting, ask your health care provider. Smoking may slow the healing of any bone and joint problems that you may have. SEEK MEDICAL CARE IF:  Your knee pain continues, changes, or gets worse.  You have a fever along with knee pain.  Your knee buckles or locks up.  Your knee becomes more swollen. SEEK IMMEDIATE MEDICAL CARE IF:   Your knee joint feels hot to the touch.  You have chest pain or trouble breathing.   This information is not intended to replace advice given to you by your health care provider. Make sure you discuss any questions you have with your health care provider.   Document Released: 02/03/2007 Document Revised: 04/29/2014 Document Reviewed: 11/22/2013 Elsevier Interactive Patient Education [employer] Elsevier Inc.

## 2015-10-17 NOTE — ED Notes (Signed)
Pt alert & oriented x4, stable gait. Patient given discharge instructions, paperwork & prescription(s). Patient  instructed to stop at the registration desk to finish any additional paperwork. Patient verbalized understanding. Pt left department w/ no further questions. 

## 2015-10-17 NOTE — ED Provider Notes (Signed)
CSN: [id number]     Arrival date & time 10/16/15  2318 History  By signing my name below, I, Majel Homereyton Lee, attest that this documentation has been prepared under the direction and in the presence of Dione Boozeavid Byron Tipping, MD . Electronically Signed: Majel HomerPeyton Lee, Scribe. 10/17/2015. 12:17 AM.   Chief Complaint  Patient presents with  . Knee Pain   The history is provided by the patient. No language interpreter was used.   HPI Comments: Norma Nelson is a 30 y.o. female with PMHx of HTN, who presents to the Emergency Department complaining of gradually worsening, 8/10, right breast and right knee pain that began 2 days PTA. Pt also notes peeling around the site of pain on her right breast. She reports she has had similar symptoms in the past but was treated successfully with antibiotics. Pt states she has tried using a hot press and sitting in epsom salt to bring it to a head on her breast with no relief. She states she has taken Tylenol with no relief. She denies recent fall or injury to her knee. She denies fever, chills, and diaphoresis.   Past Medical History  Diagnosis Date  . Hypertension   . Bronchitis   . Leukocytosis 2012    chronic  . Eczema    History reviewed. No pertinent past surgical history. Family History  Problem Relation Age of Onset  . Seizures Mother   . Hypertension Mother   . Diabetes Mother   . Stroke Mother   . Heart disease Mother   . Hypertension Maternal Uncle   . Hypertension Maternal Uncle   . Hypertension Maternal Uncle   . Heart attack Maternal Uncle   . Hypertension Maternal Uncle    Social History  Substance Use Topics  . Smoking status: Current Every Day Smoker -- 0.50 packs/day for 13 years    Types: Cigarettes  . Smokeless tobacco: Never Used  . Alcohol Use: Yes     Comment: occ   OB History    No data available     Review of Systems  Constitutional: Negative for chills and diaphoresis.  Musculoskeletal: Positive for joint swelling (Knee pain ).    Allergies  Hydrocodone; Lisinopril; Naproxen; and Tramadol  Home Medications   Prior to Admission medications   Medication Sig Start Date End Date Taking? Authorizing Provider  amLODipine (NORVASC) 10 MG tablet Take 1 tablet (10 mg total) by mouth daily. 09/14/15  Yes Jacquelin HawkingShannon McElroy, PA-C  atenolol (TENORMIN) 50 MG tablet Take 1 tablet (50 mg total) by mouth daily. 09/14/15  Yes Jacquelin HawkingShannon McElroy, PA-C  triamcinolone ointment (KENALOG) 0.1 % Apply 1 application topically as needed. 09/14/15  Yes Shannon McElroy, PA-C   BP 135/82 mmHg  Pulse 87  Temp(Src) 98.5 F (36.9 C)  Resp 20  Ht 5\' 9"  (1.753 m)  Wt 300 lb (136.079 kg)  BMI 44.28 kg/m2  SpO2 99%  LMP 09/14/2015 Physical Exam  Constitutional: She is oriented to person, place, and time. She appears well-developed and well-nourished.  HENT:  Head: Normocephalic and atraumatic.  Eyes: EOM are normal. Pupils are equal, round, and reactive to light. Right eye exhibits no discharge. Left eye exhibits no discharge.  Neck: Normal range of motion. Neck supple. No JVD present.  Cardiovascular: Normal rate, regular rhythm and normal heart sounds.   No murmur heard. Pulmonary/Chest: Effort normal and breath sounds normal. She has no wheezes. She has no rales.  Right breast has area of erythema with desquamation measured 7x6  cm inferior lateral quadrant  Mild induration   Abdominal: Soft. Bowel sounds are normal. She exhibits no distension and no mass. There is no tenderness.  Musculoskeletal: Normal range of motion. She exhibits no edema.  Left knee has crepitus with ROM  No instability  No swelling  No effusion  Lock amd McMurray tests are negatyive  Lymphadenopathy:    She has no cervical adenopathy.  Neurological: She is alert and oriented to person, place, and time. No cranial nerve deficit. She exhibits normal muscle tone. Coordination normal.  Skin: Skin is warm and dry. No rash noted.  Psychiatric: She has a normal mood and  affect. Her behavior is normal. Judgment and thought content normal.  Nursing note and vitals reviewed.   ED Course  Procedures  DIAGNOSTIC STUDIES:  Oxygen Saturation is 99% on RA, normal by my interpretation.    COORDINATION OF CARE:  12:16 AM Discussed treatment plan, which includes ultrasound of right breast with pt at bedside and pt agreed to plan.  Procedure: Limited bedside ultrasound Indication: Possible abscess Location: Right breast Findings: No significant fluid collection to indicate abscess Images are archived electronically Patient tolerated procedure well  Imaging Review Dg Knee Complete 4 Views Left  10/17/2015  CLINICAL DATA:  Pain in left knee for 2 days without trauma. EXAM: LEFT KNEE - COMPLETE 4+ VIEW COMPARISON:  None. FINDINGS: Minimal degenerative changes in the patellofemoral compartment. No fracture, dislocation, or joint effusion. IMPRESSION: Minimal degenerative changes in the patellofemoral compartment. Electronically Signed   By: Gerome Samavid  Williams III M.D   On: 10/17/2015 00:59   I have personally reviewed and evaluated these images and lab results as part of my medical decision-making.    MDM   Final diagnoses:  Cellulitis of right breast  Pain in left knee    Cellulitis versus abscess of the right breast. Old records are reviewed and she has had several ED visits for abscesses and has frequently refused incision and drainage. I did discuss this with the patient and she stated that if abscess were identified, she would be amenable to incision and drainage. Limited bedside ultrasound was done showing no obvious fluid collection. This will be treated as cellulitis with doxycycline. Knee pain appears to be from arthritis. She sent for x-rays which confirmed degenerative changes present. The liver otherwise he was ordered but patient declined [facility] She is advised to take over-the-counter ibuprofen for her arthritic  pain and she is given a prescription for doxycycline. Advised that there is a significant possibility of treatment failure and need for subsequent incision and drainage. She does have an appointment with her PCP in 2 days and she is to keep that appointment. She is referred to orthopedics for her knee complaints.  I personally performed the services described in this documentation, which was scribed in my presence. The recorded information has been reviewed and is accurate.      Dione Boozeavid Jonetta Dagley, MD 10/17/15 [phone]

## 2015-10-19 ENCOUNTER — Encounter: Payer: Self-pay | Admitting: Physician Assistant

## 2015-10-19 ENCOUNTER — Ambulatory Visit: Payer: Self-pay | Admitting: Physician Assistant

## 2015-10-19 VITALS — BP 136/64 | HR 75 | Temp 97.5°F | Ht 69.75 in | Wt 309.5 lb

## 2015-10-19 DIAGNOSIS — R7303 Prediabetes: Secondary | ICD-10-CM | POA: Insufficient documentation

## 2015-10-19 DIAGNOSIS — N61 Mastitis without abscess: Secondary | ICD-10-CM

## 2015-10-19 DIAGNOSIS — F17210 Nicotine dependence, cigarettes, uncomplicated: Secondary | ICD-10-CM

## 2015-10-19 DIAGNOSIS — D72829 Elevated white blood cell count, unspecified: Secondary | ICD-10-CM | POA: Insufficient documentation

## 2015-10-19 DIAGNOSIS — I10 Essential (primary) hypertension: Secondary | ICD-10-CM

## 2015-10-19 MED ORDER — SULFAMETHOXAZOLE-TRIMETHOPRIM 800-160 MG PO TABS: 1.0000 | ORAL_TABLET | Freq: Two times a day (BID) | ORAL | Status: DC

## 2015-10-19 NOTE — Progress Notes (Signed)
BP 136/64 mmHg  Pulse 75  Temp(Src) 97.5 F (36.4 C)  Ht 5' 9.75" (1.772 m)  Wt 309 lb 8 oz (140.388 kg)  BMI 44.71 kg/m2  SpO2 96%  LMP 09/14/2015   Subjective:    Patient ID: Norma Nelson, female    DOB: [birth year], 30 y.o.   MRN: [id number]  HPI: Norma Nelson is a 30 y.o. female presenting on 10/19/2015 for Hypertension   HPI   Pt is doing well today.  She says that she still has bump on Right breast that she was seen for in the ER a few days ago.  She says she didn't get the rx for doxycycline filled because it is expensive.  Relevant past medical, surgical, family and social history reviewed and updated as indicated. Interim medical history since our last visit reviewed. Allergies and medications reviewed and updated.   Current outpatient prescriptions:  .  amLODipine (NORVASC) 10 MG tablet, Take 1 tablet (10 mg total) by mouth daily., Disp: 30 tablet, Rfl: 1 .  atenolol (TENORMIN) 50 MG tablet, Take 1 tablet (50 mg total) by mouth daily., Disp: 30 tablet, Rfl: 1 .  triamcinolone ointment (KENALOG) 0.1 %, Apply 1 application topically as needed., Disp: 80 g, Rfl: 1 .  doxycycline (VIBRAMYCIN) 100 MG capsule, Take 1 capsule (100 mg total) by mouth 2 (two) times daily. (Patient not taking: Reported on 10/19/2015), Disp: 20 capsule, Rfl: 0 .  [DISCONTINUED] dicyclomine (BENTYL) 20 MG tablet, Take 1 tablet (20 mg total) by mouth every 6 (six) hours as needed for spasms (abdominal cramping). (Patient not taking: Reported on 08/30/2014), Disp: 15 tablet, Rfl: 0   Review of Systems  Constitutional: Negative for fever, chills, diaphoresis, appetite change, fatigue and unexpected weight change.  HENT: Negative for congestion, dental problem, drooling, ear pain, facial swelling, hearing loss, mouth sores, sneezing, sore throat, trouble swallowing and voice change.   Eyes: Negative for pain, discharge, redness, itching and visual disturbance.  Respiratory: Negative for cough,  choking, shortness of breath and wheezing.   Cardiovascular: Negative for chest pain, palpitations and leg swelling.  Gastrointestinal: Negative for vomiting, abdominal pain, diarrhea, constipation and blood in stool.  Endocrine: Negative for cold intolerance, heat intolerance and polydipsia.  Genitourinary: Negative for dysuria, hematuria and decreased urine volume.  Musculoskeletal: Positive for arthralgias. Negative for back pain and gait problem.  Skin: Negative for rash.  Allergic/Immunologic: Negative for environmental allergies.  Neurological: Negative for seizures, syncope, light-headedness and headaches.  Hematological: Negative for adenopathy.  Psychiatric/Behavioral: Negative for suicidal ideas, dysphoric mood and agitation. The patient is not nervous/anxious.     Per HPI unless specifically indicated above     Objective:    BP 136/64 mmHg  Pulse 75  Temp(Src) 97.5 F (36.4 C)  Ht 5' 9.75" (1.772 m)  Wt 309 lb 8 oz (140.388 kg)  BMI 44.71 kg/m2  SpO2 96%  LMP 09/14/2015  Wt Readings from Last 3 Encounters:  10/19/15 309 lb 8 oz (140.388 kg)  10/16/15 300 lb (136.079 kg)  10/02/15 300 lb (136.079 kg)    Physical Exam  Constitutional: She is oriented to person, place, and time. She appears well-developed and well-nourished.  HENT:  Head: Normocephalic and atraumatic.  Neck: Neck supple.  Cardiovascular: Normal rate and regular rhythm.   Pulmonary/Chest: Effort normal and breath sounds normal. Right breast exhibits mass (superficial reddish area c/w early abscess/cellulitis). Right breast exhibits no inverted nipple, no nipple discharge, no skin change and no tenderness.  Abdominal: Soft. Bowel sounds are normal. She exhibits no mass. There is no hepatosplenomegaly. There is no tenderness.  Musculoskeletal: She exhibits no edema.  Lymphadenopathy:    She has no cervical adenopathy.  Neurological: She is alert and oriented to person, place, and time.  Skin: Skin  is warm and dry.  Psychiatric: She has a normal mood and affect. Her behavior is normal.  Vitals reviewed.   Results for orders placed or performed in visit on 09/14/15  Lipid Profile  Result Value Ref Range   Cholesterol 120 (L) 125 - 200 mg/dL   Triglycerides 100 <150 mg/dL   HDL 35 (L) >=46 mg/dL   Total CHOL/HDL Ratio 3.4 <=5.0 Ratio   VLDL 20 <30 mg/dL   LDL Cholesterol 65 <130 mg/dL  COMPLETE METABOLIC PANEL WITH GFR  Result Value Ref Range   Sodium 139 135 - 146 mmol/L   Potassium 3.9 3.5 - 5.3 mmol/L   Chloride 108 98 - 110 mmol/L   CO2 20 20 - 31 mmol/L   Glucose, Bld 95 65 - 99 mg/dL   BUN 8 7 - 25 mg/dL   Creat 0.85 0.50 - 1.10 mg/dL   Total Bilirubin 0.2 0.2 - 1.2 mg/dL   Alkaline Phosphatase 92 33 - 115 U/L   AST 13 10 - 30 U/L   Nelson 18 6 - 29 U/L   Total Protein 6.6 6.1 - 8.1 g/dL   Albumin 3.6 3.6 - 5.1 g/dL   Calcium 9.2 8.6 - 10.2 mg/dL   GFR, Est African American >89 >=60 mL/min   GFR, Est Non African American >89 >=60 mL/min  CBC w/Diff/Platelet  Result Value Ref Range   WBC 19.2 (H) 3.8 - 10.8 K/uL   RBC 4.86 3.80 - 5.10 MIL/uL   Hemoglobin 13.1 11.7 - 15.5 g/dL   HCT 40.3 35.0 - 45.0 %   MCV 82.9 80.0 - 100.0 fL   MCH 27.0 27.0 - 33.0 pg   MCHC 32.5 32.0 - 36.0 g/dL   RDW 15.2 (H) 11.0 - 15.0 %   Platelets 456 (H) 140 - 400 K/uL   MPV 9.2 7.5 - 12.5 fL   Neutro Abs 12672 (H) 1500 - 7800 cells/uL   Lymphs Abs 4608 (H) 850 - 3900 cells/uL   Monocytes Absolute 1536 (H) 200 - 950 cells/uL   Eosinophils Absolute 384 15 - 500 cells/uL   Basophils Absolute 0 0 - 200 cells/uL   Neutrophils Relative % 66 %   Lymphocytes Relative 24 %   Monocytes Relative 8 %   Eosinophils Relative 2 %   Basophils Relative 0 %   Smear Review Criteria for review not met   HgB A1c  Result Value Ref Range   Hgb A1c MFr Bld 6.3 (H) <5.7 %   Mean Plasma Glucose 134 mg/dL  POCT Glucose (CBG)  Result Value Ref Range   POC Glucose 110 (A) 70 - 99 mg/dl        Assessment & Plan:    Encounter Diagnoses  Name Primary?  . Essential hypertension, benign Yes  . Cellulitis of right breast   . Leukocytosis   . Prediabetes   . Morbid obesity, unspecified obesity type ([place])   . Cigarette nicotine dependence without complication      -Reviewed labs with pt .  WBC elevated in past- always a reason/infection.  Will recheck after breast bump / infection resolves -counseled pt and Gave handout on pre-diabetes -Gave rx septra b/c it's cheaper than doxycycline  and pt will be able to get it filled -continue with current BP meds -F/u 16month.  RTO sooner prn

## 2015-11-02 ENCOUNTER — Other Ambulatory Visit: Payer: Self-pay | Admitting: Physician Assistant

## 2015-11-02 MED ORDER — METOPROLOL TARTRATE 50 MG PO TABS
50.0000 mg | ORAL_TABLET | Freq: Two times a day (BID) | ORAL | Status: DC
Start: 2015-11-02 — End: 2015-12-29

## 2015-11-02 MED ORDER — AMLODIPINE BESYLATE 10 MG PO TABS: 10.0000 mg | ORAL_TABLET | Freq: Every day | ORAL | Status: DC

## 2015-11-06 ENCOUNTER — Other Ambulatory Visit: Payer: Self-pay | Admitting: Physician Assistant

## 2015-12-19 ENCOUNTER — Encounter (HOSPITAL_COMMUNITY): Payer: Self-pay

## 2015-12-19 ENCOUNTER — Emergency Department (HOSPITAL_COMMUNITY)
Admission: EM | Admit: 2015-12-19 | Discharge: 2015-12-19 | Disposition: A | Discharge status: Home / Self Care | Payer: Self-pay | Attending: Emergency Medicine | Admitting: Emergency Medicine

## 2015-12-19 DIAGNOSIS — X500XXA Overexertion from strenuous movement or load, initial encounter: Secondary | ICD-10-CM | POA: Insufficient documentation

## 2015-12-19 DIAGNOSIS — F17210 Nicotine dependence, cigarettes, uncomplicated: Secondary | ICD-10-CM | POA: Insufficient documentation

## 2015-12-19 DIAGNOSIS — I10 Essential (primary) hypertension: Secondary | ICD-10-CM | POA: Insufficient documentation

## 2015-12-19 DIAGNOSIS — M542 Cervicalgia: Secondary | ICD-10-CM | POA: Insufficient documentation

## 2015-12-19 DIAGNOSIS — Y990 Civilian activity done for income or pay: Secondary | ICD-10-CM | POA: Insufficient documentation

## 2015-12-19 DIAGNOSIS — Z79899 Other long term (current) drug therapy: Secondary | ICD-10-CM | POA: Insufficient documentation

## 2015-12-19 DIAGNOSIS — Y93F2 Activity, caregiving, lifting: Secondary | ICD-10-CM | POA: Insufficient documentation

## 2015-12-19 DIAGNOSIS — Y929 Unspecified place or not applicable: Secondary | ICD-10-CM | POA: Insufficient documentation

## 2015-12-19 MED ORDER — IBUPROFEN 800 MG PO TABS
800.0000 mg | ORAL_TABLET | Freq: Once | ORAL | Status: AC
  Administered 2015-12-19: 800 mg via ORAL

## 2015-12-19 MED ORDER — IBUPROFEN 800 MG PO TABS
ORAL_TABLET | ORAL | Status: AC
  Administered 2015-12-19: 800 mg via ORAL
  Filled 2015-12-19: qty 1

## 2015-12-19 NOTE — ED Triage Notes (Signed)
Pt reports she lifted something at work yesterday and started having pain in neck radiating to left arm.

## 2015-12-19 NOTE — ED Notes (Signed)
MD at bedside. 

## 2015-12-19 NOTE — Discharge Instructions (Signed)
For your neck pain, please begin taking 600-800 mg ibuprofen every 6 hours for the next 5 days. You can also use an ice pack or heating pad as needed.   If your symptoms do not improve in a few days, please schedule an appointment with your regular doctor. If you develop numbness or weakness in your arms, please return to the emergency room.

## 2015-12-19 NOTE — ED Provider Notes (Signed)
[facility]-EMERGENCY DEPT Provider Note   CSN: [id number] Arrival date & time: 12/19/15  [id number]  History   Chief Complaint Chief Complaint  Patient presents with  . Neck Pain    HPI Norma Nelson is a 30 y.o. female.  HPI  Patient presenting with neck pain.   Patient reports severe neck pain upon awakening this morning. Reports that she tossed and turned all night and was unable to get comfortable while sleeping. Pain is worst when she turns her head to the L. Took Tylenol once at home with improvement in symptoms. Has not tried anything else. Denies numbness, weakness, tingling in arms. Denies injury prior to onset of pain. No history of neck pain.  Past Medical History:  Diagnosis Date  . Bronchitis   . Eczema   . Hypertension   . Leukocytosis 2012   chronic    Patient Active Problem List   Diagnosis Date Noted  . Leukocytosis 10/19/2015  . Prediabetes 10/19/2015  . Essential hypertension, benign 09/14/2015  . Morbid obesity ([facility]) 09/14/2015  . Cigarette nicotine dependence without complication 09/14/2015    History reviewed. No pertinent surgical history.  OB History    No data available      Home Medications    Prior to Admission medications   Medication Sig Start Date End Date Taking? Authorizing Provider  amLODipine (NORVASC) 10 MG tablet Take 1 tablet (10 mg total) by mouth daily. 11/02/15   Jacquelin HawkingShannon McElroy, PA-C  amLODipine (NORVASC) 10 MG tablet TAKE ONE TABLET BY MOUTH ONCE DAILY 11/06/15   Jacquelin HawkingShannon McElroy, PA-C  atenolol (TENORMIN) 50 MG tablet TAKE ONE TABLET BY MOUTH ONCE DAILY 11/06/15   Jacquelin HawkingShannon McElroy, PA-C  doxycycline (VIBRAMYCIN) 100 MG capsule Take 1 capsule (100 mg total) by mouth 2 (two) times daily. Patient not taking: Reported on 10/19/2015 10/17/15   Dione Boozeavid Glick, MD  metoprolol (LOPRESSOR) 50 MG tablet Take 1 tablet (50 mg total) by mouth 2 (two) times daily. 11/02/15   Jacquelin HawkingShannon McElroy, PA-C  sulfamethoxazole-trimethoprim (BACTRIM DS,SEPTRA DS)  800-160 MG tablet Take 1 tablet by mouth 2 (two) times daily. 10/19/15   Jacquelin HawkingShannon McElroy, PA-C  triamcinolone ointment (KENALOG) 0.1 % Apply 1 application topically as needed. 09/14/15   Jacquelin HawkingShannon McElroy, PA-C    Family History Family History  Problem Relation Age of Onset  . Seizures Mother   . Hypertension Mother   . Diabetes Mother   . Stroke Mother   . Heart disease Mother   . Hypertension Maternal Uncle   . Hypertension Maternal Uncle   . Hypertension Maternal Uncle   . Heart attack Maternal Uncle   . Hypertension Maternal Uncle     Social History Social History  Substance Use Topics  . Smoking status: Current Every Day Smoker    Packs/day: 0.50    Years: 13.00    Types: Cigarettes  . Smokeless tobacco: Never Used  . Alcohol use Yes     Comment: occ   Allergies   Hydrocodone; Lisinopril; Naproxen; and Tramadol  Review of Systems Review of Systems  Constitutional: Negative for fever.  HENT: Negative for sore throat and trouble swallowing.   Respiratory: Negative for shortness of breath.   Cardiovascular: Negative for chest pain.  Musculoskeletal: Positive for neck pain and neck stiffness. Negative for back pain.  Allergic/Immunologic: Negative for immunocompromised state.  Neurological: Negative for weakness, numbness and headaches.   Physical Exam Updated Vital Signs BP 153/85   Pulse 67   Temp 98.4 F (36.9 C) (Oral)  Resp 20   Ht 5\' 11"  (1.803 m)   Wt (!) 140.6 kg   LMP 12/13/2015   SpO2 98%   BMI 43.24 kg/m   Physical Exam  Constitutional: She is oriented to person, place, and time.  Obese female sitting on side of bed in NAD  HENT:  Head: Normocephalic and atraumatic.  Right Ear: External ear normal.  Left Ear: External ear normal.  Eyes: EOM are normal.  Neck:  Slightly limited ROM when turning head to L 2/2 pain. Full ROM otherwise. TTP of trapezius around midline and at back of neck. Full strength when turning head to side and shrugging  shoulders.   Cardiovascular: Normal rate, regular rhythm and normal heart sounds.   No murmur heard. Pulmonary/Chest: Effort normal and breath sounds normal. No respiratory distress. She has no wheezes.  Abdominal: Soft. Bowel sounds are normal. She exhibits no distension. There is no tenderness.  Neurological: She is alert and oriented to person, place, and time.  Skin: Skin is warm and dry.  Psychiatric: She has a normal mood and affect. Her behavior is normal.    ED Treatments / Results  Labs (all labs ordered are listed, but only abnormal results are displayed) Labs Reviewed - No data to display  EKG  EKG Interpretation None       Radiology No results found.  Procedures Procedures (including critical care time)  Medications Ordered in ED Medications  ibuprofen (ADVIL,MOTRIN) tablet 800 mg (not administered)     Initial Impression / Assessment and Plan / ED Course  I have reviewed the triage vital signs and the nursing notes.  Pertinent labs & imaging results that were available during my care of the patient were reviewed by me and considered in my medical decision making (see chart for details).  Clinical Course   Final Clinical Impressions(s) / ED Diagnoses   Final diagnoses:  None   Patient presenting with neck pain. Likely MSK etiology, as reproducible upon palpation and improved with Tylenol. Possibly 2/2 sleeping in abnormal position last night, as no history of preceding trauma. Stable for discharged with instructions to take ibuprofen q6 and use heating pad or ice pack as needed.   New Prescriptions New Prescriptions   No medications on file     Marquette Saa, MD 12/19/15 1014    Blane Ohara, MD 12/21/15 [phone]

## 2015-12-21 ENCOUNTER — Other Ambulatory Visit: Payer: Self-pay | Admitting: Physician Assistant

## 2015-12-29 ENCOUNTER — Other Ambulatory Visit: Payer: Self-pay | Admitting: Physician Assistant

## 2015-12-29 MED ORDER — METOPROLOL TARTRATE 50 MG PO TABS: 50.0000 mg | ORAL_TABLET | Freq: Two times a day (BID) | ORAL | 1 refills | Status: DC

## 2016-01-22 ENCOUNTER — Ambulatory Visit: Payer: Self-pay | Admitting: Physician Assistant

## 2016-01-24 ENCOUNTER — Ambulatory Visit: Payer: Self-pay | Admitting: Physician Assistant

## 2016-01-24 ENCOUNTER — Encounter: Payer: Self-pay | Admitting: Physician Assistant

## 2016-01-24 VITALS — BP 136/72 | HR 72 | Temp 97.9°F | Wt 304.0 lb

## 2016-01-24 DIAGNOSIS — D72829 Elevated white blood cell count, unspecified: Secondary | ICD-10-CM

## 2016-01-24 DIAGNOSIS — I10 Essential (primary) hypertension: Secondary | ICD-10-CM

## 2016-01-24 DIAGNOSIS — F17210 Nicotine dependence, cigarettes, uncomplicated: Secondary | ICD-10-CM

## 2016-01-24 DIAGNOSIS — R7303 Prediabetes: Secondary | ICD-10-CM

## 2016-01-24 MED ORDER — AMLODIPINE BESYLATE 10 MG PO TABS: 10.0000 mg | ORAL_TABLET | Freq: Every day | ORAL | 4 refills | Status: DC

## 2016-01-24 MED ORDER — TRIAMCINOLONE ACETONIDE 0.1 % EX OINT: 1.0000 "application " | TOPICAL_OINTMENT | CUTANEOUS | 0 refills | Status: DC | PRN

## 2016-01-24 MED ORDER — METOPROLOL TARTRATE 50 MG PO TABS: 50.0000 mg | ORAL_TABLET | Freq: Two times a day (BID) | ORAL | 4 refills | Status: DC

## 2016-01-24 NOTE — Progress Notes (Signed)
BP 136/72 (BP Location: Left Arm, Patient Position: Sitting, Cuff Size: Normal)   Pulse 72   Temp 97.9 F (36.6 C)   Wt (!) 304 lb (137.9 kg)   SpO2 98%   BMI 42.40 kg/m    Subjective:    Patient ID: Norma Nelson, female    DOB: January 28, 1986, 30 y.o.   MRN: [id number]  HPI: Norma Nelson is a 30 y.o. female presenting on 01/24/2016 for Hypertension   HPI   Pt states bump on breast resolved.  Relevant past medical, surgical, family and social history reviewed and updated as indicated. Interim medical history since our last visit reviewed. Allergies and medications reviewed and updated.   Current Outpatient Prescriptions:  .  amLODipine (NORVASC) 10 MG tablet, Take 1 tablet (10 mg total) by mouth daily., Disp: 90 tablet, Rfl: 1 .  metoprolol (LOPRESSOR) 50 MG tablet, Take 1 tablet (50 mg total) by mouth 2 (two) times daily., Disp: 60 tablet, Rfl: 1 .  triamcinolone ointment (KENALOG) 0.1 %, Apply 1 application topically as needed., Disp: 80 g, Rfl: 1   Review of Systems  Constitutional: Negative for appetite change, chills, diaphoresis, fatigue, fever and unexpected weight change.  HENT: Negative for congestion, dental problem, drooling, ear pain, facial swelling, hearing loss, mouth sores, sneezing, sore throat, trouble swallowing and voice change.   Eyes: Negative for pain, discharge, redness, itching and visual disturbance.  Respiratory: Negative for cough, choking, shortness of breath and wheezing.   Cardiovascular: Negative for chest pain, palpitations and leg swelling.  Gastrointestinal: Negative for abdominal pain, blood in stool, constipation, diarrhea and vomiting.  Endocrine: Negative for cold intolerance, heat intolerance and polydipsia.  Genitourinary: Negative for decreased urine volume, dysuria and hematuria.  Musculoskeletal: Negative for arthralgias, back pain and gait problem.  Skin: Negative for rash.  Allergic/Immunologic: Negative for environmental  allergies.  Neurological: Negative for seizures, syncope, light-headedness and headaches.  Hematological: Negative for adenopathy.  Psychiatric/Behavioral: Negative for agitation, dysphoric mood and suicidal ideas. The patient is not nervous/anxious.     Per HPI unless specifically indicated above     Objective:    BP 136/72 (BP Location: Left Arm, Patient Position: Sitting, Cuff Size: Normal)   Pulse 72   Temp 97.9 F (36.6 C)   Wt (!) 304 lb (137.9 kg)   SpO2 98%   BMI 42.40 kg/m   Wt Readings from Last 3 Encounters:  01/24/16 (!) 304 lb (137.9 kg)  12/19/15 (!) 310 lb (140.6 kg)  10/19/15 (!) 309 lb 8 oz (140.4 kg)    Physical Exam  Constitutional: She is oriented to person, place, and time. She appears well-developed and well-nourished.  HENT:  Head: Normocephalic and atraumatic.  Neck: Neck supple.  Cardiovascular: Normal rate and regular rhythm.   Pulmonary/Chest: Effort normal and breath sounds normal.  Abdominal: Soft. Bowel sounds are normal. She exhibits no mass. There is no hepatosplenomegaly. There is no tenderness.  Musculoskeletal: She exhibits no edema.  Lymphadenopathy:    She has no cervical adenopathy.  Neurological: She is alert and oriented to person, place, and time.  Skin: Skin is warm and dry.  Psychiatric: She has a normal mood and affect. Her behavior is normal.  Vitals reviewed.       Assessment & Plan:   Encounter Diagnoses  Name Primary?  . Essential hypertension, benign Yes  . Morbid obesity (HCC)   . Cigarette nicotine dependence without complication   . Prediabetes   . Leukocytosis, unspecified type     -  pt reminded to avoid using TAC on face -Continue current medications -F/u 3 months.  RTO sooner prn

## 2016-02-09 ENCOUNTER — Emergency Department (HOSPITAL_COMMUNITY)
Admission: EM | Admit: 2016-02-09 | Discharge: 2016-02-09 | Disposition: A | Discharge status: Home / Self Care | Payer: No Typology Code available for payment source | Attending: Emergency Medicine | Admitting: Emergency Medicine

## 2016-02-09 ENCOUNTER — Encounter (HOSPITAL_COMMUNITY): Payer: Self-pay | Admitting: Emergency Medicine

## 2016-02-09 DIAGNOSIS — F17210 Nicotine dependence, cigarettes, uncomplicated: Secondary | ICD-10-CM | POA: Diagnosis not present

## 2016-02-09 DIAGNOSIS — S39012A Strain of muscle, fascia and tendon of lower back, initial encounter: Secondary | ICD-10-CM

## 2016-02-09 DIAGNOSIS — Y92410 Unspecified street and highway as the place of occurrence of the external cause: Secondary | ICD-10-CM | POA: Insufficient documentation

## 2016-02-09 DIAGNOSIS — Y9389 Activity, other specified: Secondary | ICD-10-CM | POA: Diagnosis not present

## 2016-02-09 DIAGNOSIS — S199XXA Unspecified injury of neck, initial encounter: Secondary | ICD-10-CM | POA: Diagnosis present

## 2016-02-09 DIAGNOSIS — S161XXA Strain of muscle, fascia and tendon at neck level, initial encounter: Secondary | ICD-10-CM | POA: Insufficient documentation

## 2016-02-09 DIAGNOSIS — Y999 Unspecified external cause status: Secondary | ICD-10-CM | POA: Insufficient documentation

## 2016-02-09 DIAGNOSIS — Z79899 Other long term (current) drug therapy: Secondary | ICD-10-CM | POA: Diagnosis not present

## 2016-02-09 DIAGNOSIS — I10 Essential (primary) hypertension: Secondary | ICD-10-CM | POA: Insufficient documentation

## 2016-02-09 MED ORDER — OXYCODONE-ACETAMINOPHEN 5-325 MG PO TABS: 1.0000 | ORAL_TABLET | ORAL | 0 refills | Status: DC | PRN

## 2016-02-09 MED ORDER — CYCLOBENZAPRINE HCL 10 MG PO TABS: 10.0000 mg | ORAL_TABLET | Freq: Three times a day (TID) | ORAL | 0 refills | Status: DC | PRN

## 2016-02-09 NOTE — Discharge Instructions (Signed)
Apply ice packs on and off to your neck and low back. Follow-up with your primary care provider or return to the ER for any worsening symptoms
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
## 2016-02-15 ENCOUNTER — Encounter (HOSPITAL_COMMUNITY): Payer: Self-pay | Admitting: Emergency Medicine

## 2016-02-15 ENCOUNTER — Emergency Department (HOSPITAL_COMMUNITY)
Admission: EM | Admit: 2016-02-15 | Discharge: 2016-02-15 | Disposition: A | Discharge status: Home / Self Care | Payer: No Typology Code available for payment source | Attending: Emergency Medicine | Admitting: Emergency Medicine

## 2016-02-15 ENCOUNTER — Emergency Department (HOSPITAL_COMMUNITY): Payer: No Typology Code available for payment source

## 2016-02-15 DIAGNOSIS — Y999 Unspecified external cause status: Secondary | ICD-10-CM | POA: Insufficient documentation

## 2016-02-15 DIAGNOSIS — F17210 Nicotine dependence, cigarettes, uncomplicated: Secondary | ICD-10-CM | POA: Insufficient documentation

## 2016-02-15 DIAGNOSIS — I10 Essential (primary) hypertension: Secondary | ICD-10-CM | POA: Diagnosis not present

## 2016-02-15 DIAGNOSIS — Z79899 Other long term (current) drug therapy: Secondary | ICD-10-CM | POA: Diagnosis not present

## 2016-02-15 DIAGNOSIS — S161XXD Strain of muscle, fascia and tendon at neck level, subsequent encounter: Secondary | ICD-10-CM | POA: Insufficient documentation

## 2016-02-15 DIAGNOSIS — Y92410 Unspecified street and highway as the place of occurrence of the external cause: Secondary | ICD-10-CM | POA: Insufficient documentation

## 2016-02-15 DIAGNOSIS — S39012D Strain of muscle, fascia and tendon of lower back, subsequent encounter: Secondary | ICD-10-CM | POA: Insufficient documentation

## 2016-02-15 DIAGNOSIS — Y9389 Activity, other specified: Secondary | ICD-10-CM | POA: Insufficient documentation

## 2016-02-15 DIAGNOSIS — S199XXD Unspecified injury of neck, subsequent encounter: Secondary | ICD-10-CM | POA: Diagnosis present

## 2016-02-15 LAB — URINE MICROSCOPIC-ADD ON

## 2016-02-15 LAB — URINALYSIS, ROUTINE W REFLEX MICROSCOPIC
Bilirubin Urine: NEGATIVE
Glucose, UA: NEGATIVE mg/dL
Ketones, ur: NEGATIVE mg/dL
Leukocytes, UA: NEGATIVE
Nitrite: NEGATIVE
Protein, ur: NEGATIVE mg/dL
Specific Gravity, Urine: 1.015 (ref 1.005–1.030)
pH: 7.5 (ref 5.0–8.0)

## 2016-02-15 LAB — POC URINE PREG, ED: Preg Test, Ur: NEGATIVE

## 2016-02-15 MED ORDER — CYCLOBENZAPRINE HCL 10 MG PO TABS: 10.0000 mg | ORAL_TABLET | Freq: Three times a day (TID) | ORAL | 0 refills | Status: DC | PRN

## 2016-02-15 MED ORDER — PREDNISONE 10 MG PO TABS: ORAL_TABLET | ORAL | 0 refills | Status: DC

## 2016-02-15 MED ORDER — ONDANSETRON HCL 4 MG PO TABS: 4.0000 mg | ORAL_TABLET | Freq: Four times a day (QID) | ORAL | 0 refills | Status: DC

## 2016-02-15 MED ORDER — DICLOFENAC SODIUM 75 MG PO TBEC: 75.0000 mg | DELAYED_RELEASE_TABLET | Freq: Two times a day (BID) | ORAL | 0 refills | Status: DC

## 2016-02-15 NOTE — Discharge Instructions (Signed)
Follow up with your primary doctor °
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
## 2016-02-17 LAB — URINE CULTURE

## 2016-03-28 ENCOUNTER — Other Ambulatory Visit: Payer: Self-pay | Admitting: Physician Assistant

## 2016-03-28 MED ORDER — AMLODIPINE BESYLATE 10 MG PO TABS: 10.0000 mg | ORAL_TABLET | Freq: Every day | ORAL | 1 refills | Status: DC

## 2016-04-18 ENCOUNTER — Other Ambulatory Visit: Payer: Self-pay

## 2016-04-18 DIAGNOSIS — D72829 Elevated white blood cell count, unspecified: Secondary | ICD-10-CM

## 2016-04-18 DIAGNOSIS — R7303 Prediabetes: Secondary | ICD-10-CM

## 2016-04-18 DIAGNOSIS — I10 Essential (primary) hypertension: Secondary | ICD-10-CM

## 2016-04-20 ENCOUNTER — Other Ambulatory Visit: Payer: Self-pay | Admitting: Physician Assistant

## 2016-04-25 ENCOUNTER — Ambulatory Visit: Payer: Self-pay | Admitting: Physician Assistant

## 2016-04-29 ENCOUNTER — Emergency Department (HOSPITAL_COMMUNITY)
Admission: EM | Admit: 2016-04-29 | Discharge: 2016-04-29 | Disposition: A | Discharge status: Home / Self Care | Payer: Self-pay | Attending: Emergency Medicine | Admitting: Emergency Medicine

## 2016-04-29 ENCOUNTER — Encounter (HOSPITAL_COMMUNITY): Payer: Self-pay | Admitting: Emergency Medicine

## 2016-04-29 ENCOUNTER — Encounter: Payer: Self-pay | Admitting: Physician Assistant

## 2016-04-29 DIAGNOSIS — F17210 Nicotine dependence, cigarettes, uncomplicated: Secondary | ICD-10-CM | POA: Insufficient documentation

## 2016-04-29 DIAGNOSIS — R110 Nausea: Secondary | ICD-10-CM

## 2016-04-29 DIAGNOSIS — R197 Diarrhea, unspecified: Secondary | ICD-10-CM | POA: Insufficient documentation

## 2016-04-29 DIAGNOSIS — Z79899 Other long term (current) drug therapy: Secondary | ICD-10-CM | POA: Insufficient documentation

## 2016-04-29 DIAGNOSIS — R109 Unspecified abdominal pain: Secondary | ICD-10-CM | POA: Insufficient documentation

## 2016-04-29 DIAGNOSIS — R112 Nausea with vomiting, unspecified: Secondary | ICD-10-CM | POA: Insufficient documentation

## 2016-04-29 DIAGNOSIS — I10 Essential (primary) hypertension: Secondary | ICD-10-CM | POA: Insufficient documentation

## 2016-04-29 LAB — URINALYSIS, ROUTINE W REFLEX MICROSCOPIC
Bilirubin Urine: NEGATIVE
Glucose, UA: NEGATIVE mg/dL
Ketones, ur: NEGATIVE mg/dL
Leukocytes, UA: NEGATIVE
Nitrite: NEGATIVE
Protein, ur: 30 mg/dL — AB
Specific Gravity, Urine: 1.029 (ref 1.005–1.030)
pH: 5 (ref 5.0–8.0)

## 2016-04-29 MED ORDER — LOPERAMIDE HCL 2 MG PO CAPS: 2.0000 mg | ORAL_CAPSULE | Freq: Four times a day (QID) | ORAL | 0 refills | Status: DC | PRN

## 2016-04-29 MED ORDER — LOPERAMIDE HCL 2 MG PO CAPS
ORAL_CAPSULE | ORAL | Status: AC
  Filled 2016-04-29: qty 2

## 2016-04-29 MED ORDER — LOPERAMIDE HCL 1 MG/5ML PO LIQD: 4.0000 mg | Freq: Once | ORAL | Status: DC

## 2016-04-29 MED ORDER — ONDANSETRON 4 MG PO TBDP
4.0000 mg | ORAL_TABLET | Freq: Once | ORAL | Status: AC
  Administered 2016-04-29: 4 mg via ORAL
  Filled 2016-04-29: qty 1

## 2016-04-29 MED ORDER — LOPERAMIDE HCL 2 MG PO CAPS
4.0000 mg | ORAL_CAPSULE | Freq: Once | ORAL | Status: AC
  Administered 2016-04-29: 4 mg via ORAL

## 2016-04-29 MED ORDER — ONDANSETRON HCL 4 MG PO TABS: 4.0000 mg | ORAL_TABLET | Freq: Four times a day (QID) | ORAL | 0 refills | Status: DC

## 2016-04-29 NOTE — ED Triage Notes (Signed)
PT c/o n/v/d that started this am. PT denies any urinary symptoms.

## 2016-04-29 NOTE — Discharge Instructions (Signed)
You have been seen in the Emergency Department (ED) today for nausea, vomiting, and diarrhea.  Your work up today has not shown a clear cause for your symptoms. °You have been prescribed Zofran; please use as prescribed as needed for your nausea. ° °Follow up with your doctor as soon as possible regarding today?s emergent visit and your symptoms of nausea.  ° °Return to the ED if you develop abdominal, bloody vomiting, bloody diarrhea, if you are unable to tolerate fluids due to vomiting, or if you develop other symptoms that concern you. ° °

## 2016-04-29 NOTE — ED Provider Notes (Signed)
[facility]-EMERGENCY DEPT Provider Note   CSN: [id number] Arrival date & time: 04/29/16  1445 By signing my name below, I, Norma Nelson, attest that this documentation has been prepared under the direction and in the presence of Maia Plan, MD . Electronically Signed: Levon Nelson, Scribe. 04/29/2016. 8:34 PM.   History   Chief Complaint Chief Complaint  Patient presents with  . Diarrhea    HPI Norma Nelson is a 31 y.o. female who presents to the Emergency Department complaining of frequent, diarrhea which began this morning. Per pt, she has had 5 episodes of diarrhea today. She notes associated nausea, vomiting 1x, generalized body aches and diffuse abdominal pain. Pt states she has been able to keep fluids down. No treatments tried PTA. No alleviating or modifying factors noted. Pt denies any chest pain, SOB, headaches, congestion, rhinorrhea, dysuria or any urinary symptoms.   The history is provided by the patient. No language interpreter was used.    Past Medical History:  Diagnosis Date  . Bronchitis   . Eczema   . Hypertension   . Leukocytosis 2012   chronic    Patient Active Problem List   Diagnosis Date Noted  . Leukocytosis 10/19/2015  . Prediabetes 10/19/2015  . Essential hypertension, benign 09/14/2015  . Morbid obesity (HCC) 09/14/2015  . Cigarette nicotine dependence without complication 09/14/2015    History reviewed. No pertinent surgical history.  OB History    Gravida Para Term Preterm AB Living   0 0 0 0 0 0   SAB TAB Ectopic Multiple Live Births   0 0 0 0 0      Home Medications    Prior to Admission medications   Medication Sig Start Date End Date Taking? Authorizing Provider  amLODipine (NORVASC) 10 MG tablet Take 1 tablet (10 mg total) by mouth daily. 03/28/16   Jacquelin Hawking, PA-C  amLODipine (NORVASC) 10 MG tablet TAKE ONE TABLET BY MOUTH ONCE DAILY 04/21/16   Jacquelin Hawking, PA-C  cyclobenzaprine (FLEXERIL) 10 MG tablet Take 1 tablet  (10 mg total) by mouth 3 (three) times daily as needed. 02/15/16   Tammy Triplett, PA-C  diclofenac (VOLTAREN) 75 MG EC tablet Take 1 tablet (75 mg total) by mouth 2 (two) times daily. Take with food 02/15/16   Tammy Triplett, PA-C  loperamide (IMODIUM) 2 MG capsule Take 1 capsule (2 mg total) by mouth 4 (four) times daily as needed for diarrhea or loose stools. 04/29/16   Maia Plan, MD  metoprolol (LOPRESSOR) 50 MG tablet Take 1 tablet (50 mg total) by mouth 2 (two) times daily. 01/24/16   Jacquelin Hawking, PA-C  ondansetron (ZOFRAN) 4 MG tablet Take 1 tablet (4 mg total) by mouth every 6 (six) hours. 04/29/16   Maia Plan, MD  oxyCODONE-acetaminophen (PERCOCET/ROXICET) 5-325 MG tablet Take 1 tablet by mouth every 4 (four) hours as needed. 02/09/16   Tammy Triplett, PA-C  predniSONE (DELTASONE) 10 MG tablet Take 6 tablets day one, 5 tablets day two, 4 tablets day three, 3 tablets day four, 2 tablets day five, then 1 tablet day six 02/15/16   Tammy Triplett, PA-C  triamcinolone ointment (KENALOG) 0.1 % Apply 1 application topically as needed. 01/24/16   Jacquelin Hawking, PA-C    Family History Family History  Problem Relation Age of Onset  . Seizures Mother   . Hypertension Mother   . Diabetes Mother   . Stroke Mother   . Heart disease Mother   . Hypertension Maternal Uncle   .  Hypertension Maternal Uncle   . Hypertension Maternal Uncle   . Heart attack Maternal Uncle   . Hypertension Maternal Uncle     Social History Social History  Substance Use Topics  . Smoking status: Current Every Day Smoker    Packs/day: 0.50    Years: 13.00    Types: Cigarettes  . Smokeless tobacco: Never Used  . Alcohol use Yes     Comment: occ     Allergies   Hydrocodone; Lisinopril; Naproxen; and Tramadol   Review of Systems Review of Systems 10 systems reviewed and all are negative for acute change except as noted in the HPI.  Physical Exam Updated Vital Signs BP 166/93 (BP Location: Left  Arm)   Pulse 74   Temp 97.9 F (36.6 C) (Oral)   Resp 20   Ht 5\' 11"  (1.803 m)   Wt 300 lb (136.1 kg)   LMP 04/17/2016   SpO2 98%   BMI 41.84 kg/m   Physical Exam  Nursing note and vitals reviewed. CONSTITUTIONAL: Well developed/well nourished HEAD: Normocephalic/atraumatic EYES: EOMI/PERRL ENMT: Mucous membranes moist NECK: supple no meningeal signs SPINE/BACK:entire spine nontender CV: S1/S2 noted, no murmurs/rubs/gallops noted LUNGS: Lungs are clear to auscultation bilaterally, no apparent distress ABDOMEN: soft, nontender, no rebound or guarding, bowel sounds noted throughout abdomen GU:no cva tenderness NEURO: Pt is awake/alert/appropriate, moves all extremitiesx4.  No facial droop.   EXTREMITIES: pulses normal/equal, full ROM SKIN: warm, color normal PSYCH: no abnormalities of mood noted, alert and oriented to situation   ED Treatments / Results  DIAGNOSTIC STUDIES:  Oxygen Saturation is 98% on RA, normal by my interpretation.    COORDINATION OF CARE:  8:26 PM Discussed treatment plan with pt at bedside and pt agreed to plan.  Labs (all labs ordered are listed, but only abnormal results are displayed) Labs Reviewed  URINALYSIS, ROUTINE W REFLEX MICROSCOPIC - Abnormal; Notable for the following:       Result Value   APPearance HAZY (*)    Hgb urine dipstick SMALL (*)    Protein, ur 30 (*)    Bacteria, UA RARE (*)    All other components within normal limits    EKG None  Radiology None  Procedures Procedures (including critical care time)  None  Medications Ordered in ED Medications  ondansetron (ZOFRAN-ODT) disintegrating tablet 4 mg (4 mg Oral Given 04/29/16 2037)  loperamide (IMODIUM) capsule 4 mg (4 mg Oral Given 04/29/16 2036)     Initial Impression / Assessment and Plan / ED Course  I have reviewed the triage vital signs and the nursing notes.  Pertinent labs & imaging results that were available during my care of the patient were reviewed  by me and considered in my medical decision making (see chart for details).  Clinical Course    Patient presents to the ED with mild abdominal discomfort and diarrhea. No vomiting. Normal UA. Patient is tolerating PO and is very well-appearing. No indication for advanced imaging or the abdomen or additional labs at this time. Patient is feeling much better after Zofran and Imodium. Discharged with these medications and given information on obtaining PCP.   At this time, I do not feel there is any life-threatening condition present. I have reviewed and discussed all results (EKG, imaging, lab, urine as appropriate), exam findings with patient. I have reviewed nursing notes and appropriate previous records.  I feel the patient is safe to be discharged home without further emergent workup. Discussed usual and customary return precautions.  Patient and family (if present) verbalize understanding and are comfortable with this plan.  Patient will follow-up with their primary care provider. If they do not have a primary care provider, information for follow-up has been provided to them. All questions have been answered.   Final Clinical Impressions(s) / ED Diagnoses   Final diagnoses:  Diarrhea, unspecified type  Nausea    New Prescriptions Discharge Medication List as of 04/29/2016  9:22 PM    START taking these medications   Details  loperamide (IMODIUM) 2 MG capsule Take 1 capsule (2 mg total) by mouth 4 (four) times daily as needed for diarrhea or loose stools., Starting Mon 04/29/2016, Print       I personally performed the services described in this documentation, which was scribed in my presence. The recorded information has been reviewed and is accurate.   Alona BeneJoshua Alvie Fowles, MD   Maia PlanJoshua G Garnette Greb, MD 05/01/16 1440

## 2016-07-03 ENCOUNTER — Other Ambulatory Visit: Payer: Self-pay | Admitting: Physician Assistant

## 2016-07-08 ENCOUNTER — Emergency Department (HOSPITAL_COMMUNITY)
Admission: EM | Admit: 2016-07-08 | Discharge: 2016-07-09 | Disposition: A | Discharge status: Home / Self Care | Payer: Self-pay | Attending: Emergency Medicine | Admitting: Emergency Medicine

## 2016-07-08 ENCOUNTER — Encounter (HOSPITAL_COMMUNITY): Payer: Self-pay

## 2016-07-08 DIAGNOSIS — T781XXA Other adverse food reactions, not elsewhere classified, initial encounter: Secondary | ICD-10-CM | POA: Insufficient documentation

## 2016-07-08 DIAGNOSIS — F17210 Nicotine dependence, cigarettes, uncomplicated: Secondary | ICD-10-CM | POA: Insufficient documentation

## 2016-07-08 DIAGNOSIS — I10 Essential (primary) hypertension: Secondary | ICD-10-CM | POA: Insufficient documentation

## 2016-07-08 NOTE — ED Triage Notes (Signed)
Patient states she was around shrimp cooking and throat began to itch. Eyes noted to be red and watery. Denies respiratory issues.

## 2016-07-09 MED ORDER — FAMOTIDINE 20 MG PO TABS
40.0000 mg | ORAL_TABLET | Freq: Once | ORAL | Status: AC
  Administered 2016-07-09: 40 mg via ORAL
  Filled 2016-07-09: qty 2

## 2016-07-09 MED ORDER — PREDNISONE 10 MG PO TABS: ORAL_TABLET | ORAL | 0 refills | Status: DC

## 2016-07-09 MED ORDER — DEXAMETHASONE SODIUM PHOSPHATE 10 MG/ML IJ SOLN
10.0000 mg | Freq: Once | INTRAMUSCULAR | Status: AC
  Administered 2016-07-09: 10 mg via INTRAMUSCULAR
  Filled 2016-07-09: qty 1

## 2016-07-09 MED ORDER — EPINEPHRINE 0.15 MG/0.15ML IJ SOAJ: 0.1500 mg | INTRAMUSCULAR | 0 refills | Status: DC | PRN

## 2016-07-09 NOTE — ED Provider Notes (Signed)
[facility] Provider Note   CSN: [id number] Arrival date & time: 07/08/16  2334     History   Chief Complaint Chief Complaint  Patient presents with  . Allergic Reaction    HPI Norma Nelson is a 31 y.o. female presenting for evaluation of throat itching which occurred shortly after eating shrimp for dinner at 10:30 tonight.  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  Tonight her wife prepared shrimp alfredo, Norma Nelson removed the shrimp, just eating the pasta and shortly after developed itching in her throat and red watery eyes.  She denies throat, tongue or mouth swelling and has had no shortness of breath, wheezing, voice change,  skin itching, rash or hives.  She took benadryl 50 mg prior to arrival and now has drowsiness, but denies itching throat at this time.    The history is provided by the patient and the spouse.    Past Medical History:  Diagnosis Date  . Bronchitis   . Eczema   . Hypertension   . Leukocytosis 2012   chronic    Patient Active Problem List   Diagnosis Date Noted  . Leukocytosis 10/19/2015  . Prediabetes 10/19/2015  . Essential hypertension, benign 09/14/2015  . Morbid obesity (HCC) 09/14/2015  . Cigarette nicotine dependence without complication 09/14/2015    History reviewed. No pertinent surgical history.  OB History    Gravida Para Term Preterm AB Living   0 0 0 0 0 0   SAB TAB Ectopic Multiple Live Births   0 0 0 0 0       Home Medications    Prior to Admission medications   Medication Sig Start Date End Date Taking? Authorizing Provider  amLODipine (NORVASC) 10 MG tablet Take 1 tablet (10 mg total) by mouth daily. 03/28/16   Jacquelin HawkingShannon McElroy, PA-C  amLODipine (NORVASC) 10 MG tablet TAKE ONE TABLET BY MOUTH ONCE DAILY 04/21/16   Jacquelin HawkingShannon McElroy, PA-C  cyclobenzaprine (FLEXERIL) 10 MG tablet Take 1 tablet (10 mg total) by mouth 3 (three)  times daily as needed. 02/15/16   Tammy Triplett, PA-C  diclofenac (VOLTAREN) 75 MG EC tablet Take 1 tablet (75 mg total) by mouth 2 (two) times daily. Take with food 02/15/16   Tammy Triplett, PA-C  EPINEPHrine 0.15 MG/0.15ML IJ injection Inject 0.15 mLs (0.15 mg total) into the muscle as needed for anaphylaxis. 07/09/16   Burgess AmorJulie Aydon Swamy, PA-C  loperamide (IMODIUM) 2 MG capsule Take 1 capsule (2 mg total) by mouth 4 (four) times daily as needed for diarrhea or loose stools. 04/29/16   Maia PlanJoshua G Long, MD  metoprolol (LOPRESSOR) 50 MG tablet Take 1 tablet (50 mg total) by mouth 2 (two) times daily. 01/24/16   Jacquelin HawkingShannon McElroy, PA-C  ondansetron (ZOFRAN) 4 MG tablet Take 1 tablet (4 mg total) by mouth every 6 (six) hours. 04/29/16   Maia PlanJoshua G Long, MD  oxyCODONE-acetaminophen (PERCOCET/ROXICET) 5-325 MG tablet Take 1 tablet by mouth every 4 (four) hours as needed. 02/09/16   Tammy Triplett, PA-C  predniSONE (DELTASONE) 10 MG tablet Take 6 tablets day one, 5 tablets day two, 4 tablets day three, 3 tablets day four, 2 tablets day five, then 1 tablet day six 07/09/16   Burgess AmorJulie Axton Cihlar, PA-C  triamcinolone ointment (KENALOG) 0.1 % Apply 1 application topically as needed. 01/24/16   Jacquelin HawkingShannon McElroy, PA-C    Family History Family History  Problem Relation Age  of Onset  . Seizures Mother   . Hypertension Mother   . Diabetes Mother   . Stroke Mother   . Heart disease Mother   . Hypertension Maternal Uncle   . Hypertension Maternal Uncle   . Hypertension Maternal Uncle   . Heart attack Maternal Uncle   . Hypertension Maternal Uncle     Social History Social History  Substance Use Topics  . Smoking status: Current Every Day Smoker    Packs/day: 0.50    Years: 13.00    Types: Cigarettes  . Smokeless tobacco: Never Used  . Alcohol use Yes     Comment: occ     Allergies   Hydrocodone; Lisinopril; Naproxen; and Tramadol   Review of Systems Review of Systems  Constitutional: Negative for diaphoresis and  fever.  HENT: Negative for congestion and sore throat.        Negative except as mentioned in HPI.   Eyes: Positive for redness. Negative for itching.  Respiratory: Negative for chest tightness, shortness of breath, wheezing and stridor.   Cardiovascular: Negative for chest pain.  Gastrointestinal: Negative for abdominal pain, nausea and vomiting.  Genitourinary: Negative.   Musculoskeletal: Negative for arthralgias, joint swelling and neck pain.  Skin: Negative.  Negative for rash and wound.  Neurological: Negative for dizziness, weakness, light-headedness, numbness and headaches.  Psychiatric/Behavioral: Negative.      Physical Exam Updated Vital Signs BP (!) 160/83 (BP Location: Left Arm)   Pulse 84   Temp 98.1 F (36.7 C) (Oral)   Resp 18   Ht 5\' 9"  (1.753 m)   Wt (!) 145.2 kg   LMP 07/08/2016   SpO2 99%   BMI 47.26 kg/m   Physical Exam  Constitutional: She appears well-developed and well-nourished.  HENT:  Head: Normocephalic and atraumatic.  Eyes: EOM are normal. Pupils are equal, round, and reactive to light. Right conjunctiva is injected. Left conjunctiva is injected.  Neck: Trachea normal, normal range of motion and phonation normal.  Cardiovascular: Normal rate, regular rhythm, normal heart sounds and intact distal pulses.   Pulmonary/Chest: Effort normal and breath sounds normal. No stridor. She has no decreased breath sounds. She has no wheezes. She has no rhonchi. She has no rales.  Abdominal: Soft. Bowel sounds are normal. There is no tenderness.  Musculoskeletal: Normal range of motion.  Neurological: She is alert.  Skin: Skin is warm and dry.  Papular rash on face, including periorbital and forehead, left greater than right (pt endorses chronic eczema).   Psychiatric: She has a normal mood and affect. Her behavior is normal.  Nursing note and vitals reviewed.    ED Treatments / Results  Labs (all labs ordered are listed, but only abnormal results are  displayed) Labs Reviewed - No data to display  EKG  EKG Interpretation None       Radiology No results found.  Procedures Procedures (including critical care time)  Medications Ordered in ED Medications  dexamethasone (DECADRON) injection 10 mg (10 mg Intramuscular Given 07/09/16 0052)  famotidine (PEPCID) tablet 40 mg (40 mg Oral Given 07/09/16 0052)     Initial Impression / Assessment and Plan / ED Course  I have reviewed the triage vital signs and the nursing notes.  Pertinent labs & imaging results that were available during my care of the patient were reviewed by me and considered in my medical decision making (see chart for details).     Pt with symptoms suggesting possible food allergy/reaction but without anaphylactic sx  at this time.  She treated self with benadryl 50 mg prior to arrival.  Decadron 10 mg IM, pepcid 40 mg PO given here.  Sx started at 10:30 pm without progression over the past 2.5 hours. Pt discharged home, prednisone taper, continue taking benadryl x 1 day.  Epi pen prescribed and instructed in its use.  Final Clinical Impressions(s) / ED Diagnoses   Final diagnoses:  Allergic reaction to food, initial encounter    New Prescriptions New Prescriptions   EPINEPHRINE 0.15 MG/0.15ML IJ INJECTION    Inject 0.15 mLs (0.15 mg total) into the muscle as needed for anaphylaxis.   PREDNISONE (DELTASONE) 10 MG TABLET    Take 6 tablets day one, 5 tablets day two, 4 tablets day three, 3 tablets day four, 2 tablets day five, then 1 tablet day six     Burgess Amor, PA-C 07/09/16 0131    Gilda Crease, MD 07/09/16 [phone]

## 2016-07-09 NOTE — Discharge Instructions (Signed)
You should avoid all shellfish in the future to avoid a worse allergic reaction.  You should continue to take your benadryl 25 mg every 6 hours for the next day.  Take the entire course of the prednisone.  Return here for any worsening symptoms.  You should carry an epi pen with you and keep one at home in case you ever accidentally are exposed again.  The next exposure can be more severe and potentially dangerous.  Be prepared to use the epi pen if you develop any shortness of breath, throat, mouth or tongue swelling with any future exposure to shrimp or shellfish.

## 2016-07-12 ENCOUNTER — Encounter (HOSPITAL_COMMUNITY): Payer: Self-pay

## 2016-07-12 ENCOUNTER — Emergency Department (HOSPITAL_COMMUNITY)
Admission: EM | Admit: 2016-07-12 | Discharge: 2016-07-12 | Disposition: A | Discharge status: Home / Self Care | Payer: Self-pay | Attending: Emergency Medicine | Admitting: Emergency Medicine

## 2016-07-12 DIAGNOSIS — R1032 Left lower quadrant pain: Secondary | ICD-10-CM | POA: Insufficient documentation

## 2016-07-12 DIAGNOSIS — R1031 Right lower quadrant pain: Secondary | ICD-10-CM | POA: Insufficient documentation

## 2016-07-12 DIAGNOSIS — R197 Diarrhea, unspecified: Secondary | ICD-10-CM | POA: Insufficient documentation

## 2016-07-12 DIAGNOSIS — R1012 Left upper quadrant pain: Secondary | ICD-10-CM | POA: Insufficient documentation

## 2016-07-12 DIAGNOSIS — Z79899 Other long term (current) drug therapy: Secondary | ICD-10-CM | POA: Insufficient documentation

## 2016-07-12 DIAGNOSIS — F17210 Nicotine dependence, cigarettes, uncomplicated: Secondary | ICD-10-CM | POA: Insufficient documentation

## 2016-07-12 DIAGNOSIS — I10 Essential (primary) hypertension: Secondary | ICD-10-CM | POA: Insufficient documentation

## 2016-07-12 LAB — URINALYSIS, MICROSCOPIC (REFLEX)

## 2016-07-12 LAB — URINALYSIS, ROUTINE W REFLEX MICROSCOPIC
Bilirubin Urine: NEGATIVE
Glucose, UA: NEGATIVE mg/dL
Ketones, ur: NEGATIVE mg/dL
Leukocytes, UA: NEGATIVE
Nitrite: NEGATIVE
Specific Gravity, Urine: 1.025 (ref 1.005–1.030)
pH: 6 (ref 5.0–8.0)

## 2016-07-12 LAB — POC URINE PREG, ED: Preg Test, Ur: NEGATIVE

## 2016-07-12 MED ORDER — ACETAMINOPHEN 325 MG PO TABS
650.0000 mg | ORAL_TABLET | Freq: Once | ORAL | Status: AC
  Administered 2016-07-12: 650 mg via ORAL
  Filled 2016-07-12: qty 2

## 2016-07-12 MED ORDER — ONDANSETRON 8 MG PO TBDP
8.0000 mg | ORAL_TABLET | Freq: Once | ORAL | Status: AC
  Administered 2016-07-12: 8 mg via ORAL
  Filled 2016-07-12: qty 1

## 2016-07-12 NOTE — ED Provider Notes (Signed)
[facility]PT Provider Note   CSN: [id number] Arrival date & time: 07/12/16  0441     History   Chief Complaint Chief Complaint  Patient presents with  . Abdominal Pain    HPI Norma Nelson is a 31 y.o. female.  The history is provided by the patient.  Abdominal Pain   This is a new problem. The current episode started 6 to 12 hours ago. The problem occurs constantly. The problem has not changed since onset.Associated with: heavy lifting. The pain is located in the LLQ and RLQ. The pain is mild. Associated symptoms include diarrhea. Pertinent negatives include fever, nausea, vomiting and dysuria. Exacerbated by: standing up. Relieved by: rest.  Patient reports recent heavy lifting She reports over past 12 hrs she has had onset of low abd pain, only while standing, none while lying down She has mild low back pain as well She reports nonbloody diarrhea (no travel and no sick contacts)    Past Medical History:  Diagnosis Date  . Bronchitis   . Eczema   . Hypertension   . Leukocytosis 2012   chronic    Patient Active Problem List   Diagnosis Date Noted  . Leukocytosis 10/19/2015  . Prediabetes 10/19/2015  . Essential hypertension, benign 09/14/2015  . Morbid obesity (HCC) 09/14/2015  . Cigarette nicotine dependence without complication 09/14/2015    History reviewed. No pertinent surgical history.  OB History    Gravida Para Term Preterm AB Living   0 0 0 0 0 0   SAB TAB Ectopic Multiple Live Births   0 0 0 0 0       Home Medications    Prior to Admission medications   Medication Sig Start Date End Date Taking? Authorizing Provider  citalopram (CELEXA) 20 MG tablet Take 20 mg by mouth daily.   Yes Historical Provider, MD  traZODone (DESYREL) 100 MG tablet Take 100 mg by mouth at bedtime.   Yes Historical Provider, MD  amLODipine (NORVASC) 10 MG tablet Take 1 tablet (10 mg total) by mouth daily. 03/28/16   Jacquelin HawkingShannon McElroy, PA-C  amLODipine (NORVASC)  10 MG tablet TAKE ONE TABLET BY MOUTH ONCE DAILY 04/21/16   Jacquelin HawkingShannon McElroy, PA-C  EPINEPHrine 0.15 MG/0.15ML IJ injection Inject 0.15 mLs (0.15 mg total) into the muscle as needed for anaphylaxis. 07/09/16   Burgess AmorJulie Idol, PA-C  loperamide (IMODIUM) 2 MG capsule Take 1 capsule (2 mg total) by mouth 4 (four) times daily as needed for diarrhea or loose stools. 04/29/16   Maia PlanJoshua G Long, MD  metoprolol (LOPRESSOR) 50 MG tablet Take 1 tablet (50 mg total) by mouth 2 (two) times daily. 01/24/16   Jacquelin HawkingShannon McElroy, PA-C  triamcinolone ointment (KENALOG) 0.1 % Apply 1 application topically as needed. 01/24/16   Jacquelin HawkingShannon McElroy, PA-C    Family History Family History  Problem Relation Age of Onset  . Seizures Mother   . Hypertension Mother   . Diabetes Mother   . Stroke Mother   . Heart disease Mother   . Hypertension Maternal Uncle   . Hypertension Maternal Uncle   . Hypertension Maternal Uncle   . Heart attack Maternal Uncle   . Hypertension Maternal Uncle     Social History Social History  Substance Use Topics  . Smoking status: Current Every Day Smoker    Packs/day: 0.50    Years: 13.00    Types: Cigarettes  . Smokeless tobacco: Never Used  . Alcohol use Yes     Comment: occ  Allergies   Hydrocodone; Lisinopril; Naproxen; Shellfish allergy; and Tramadol   Review of Systems Review of Systems  Constitutional: Negative for fever.  Gastrointestinal: Positive for abdominal pain and diarrhea. Negative for nausea and vomiting.  Genitourinary: Negative for dysuria and vaginal discharge.  All other systems reviewed and are negative.    Physical Exam Updated Vital Signs BP (!) 157/102   Pulse (!) 120   Temp 98.1 F (36.7 C)   Resp 20   Ht 5\' 9"  (1.753 m)   Wt (!) 145.2 kg   LMP 07/08/2016   SpO2 100%   BMI 47.26 kg/m   Physical Exam CONSTITUTIONAL: Well developed/well nourished, pt smiling, using phone and in no distress HEAD: Normocephalic/atraumatic EYES:  EOMI/PERRL ENMT: Mucous membranes moist NECK: supple no meningeal signs SPINE/BACK:entire spine nontender CV: S1/S2 noted, no murmurs/rubs/gallops noted LUNGS: Lungs are clear to auscultation bilaterally, no apparent distress ABDOMEN: soft, nontender, no rebound or guarding, bowel sounds noted throughout abdomen, no abdominal wall hernia noted GU:no cva tenderness NEURO: Pt is awake/alert/appropriate, moves all extremitiesx4.  No facial droop.   EXTREMITIES: pulses normal/equal, full ROM SKIN: warm, color normal PSYCH: no abnormalities of mood noted, alert and oriented to situation   ED Treatments / Results  Labs (all labs ordered are listed, but only abnormal results are displayed) Labs Reviewed  URINALYSIS, ROUTINE W REFLEX MICROSCOPIC - Abnormal; Notable for the following:       Result Value   Hgb urine dipstick MODERATE (*)    Protein, ur TRACE (*)    All other components within normal limits  URINALYSIS, MICROSCOPIC (REFLEX) - Abnormal; Notable for the following:    Bacteria, UA MANY (*)    Squamous Epithelial / LPF 0-5 (*)    All other components within normal limits  POC URINE PREG, ED    EKG  EKG Interpretation None       Radiology No results found.  Procedures Procedures (including critical care time)  Medications Ordered in ED Medications  ondansetron (ZOFRAN-ODT) disintegrating tablet 8 mg (8 mg Oral Given 07/12/16 0459)  acetaminophen (TYLENOL) tablet 650 mg (650 mg Oral Given 07/12/16 0520)     Initial Impression / Assessment and Plan / ED Course  I have reviewed the triage vital signs and the nursing notes.  Pertinent labs results that were available during my care of the patient were reviewed by me and considered in my medical decision making (see chart for details).     Pt well appearing/no distress, no pain while resting in bed She had some hematuria but she reports just finishing her menstrual cycle recently She is clinically well No focal  tenderness I don't feel further imaging/workup warranted at this time BP (!) 156/110   Pulse (!) 113   Temp 98.2 F (36.8 C)   Resp 18   Ht 5\' 9"  (1.753 m)   Wt (!) 145.2 kg   LMP 07/08/2016   SpO2 96%   BMI 47.26 kg/m   Final Clinical Impressions(s) / ED Diagnoses   Final diagnoses:  Left upper quadrant pain    New Prescriptions Current Discharge Medication List       Zadie Rhine, MD 07/12/16 0630

## 2016-07-12 NOTE — Discharge Instructions (Signed)

## 2016-07-12 NOTE — ED Triage Notes (Signed)
Pain across lower abd for a couple of days, states had a couple of diarrhea stools, no vomiting

## 2016-07-18 ENCOUNTER — Ambulatory Visit: Payer: Self-pay | Admitting: Physician Assistant

## 2016-08-07 ENCOUNTER — Ambulatory Visit: Payer: Self-pay | Admitting: Physician Assistant

## 2016-08-07 ENCOUNTER — Encounter: Payer: Self-pay | Admitting: Physician Assistant

## 2016-08-07 VITALS — BP 144/76 | HR 75 | Temp 99.3°F | Ht 69.75 in | Wt 297.8 lb

## 2016-08-07 DIAGNOSIS — R7303 Prediabetes: Secondary | ICD-10-CM

## 2016-08-07 DIAGNOSIS — D72829 Elevated white blood cell count, unspecified: Secondary | ICD-10-CM

## 2016-08-07 DIAGNOSIS — L309 Dermatitis, unspecified: Secondary | ICD-10-CM

## 2016-08-07 DIAGNOSIS — I10 Essential (primary) hypertension: Secondary | ICD-10-CM

## 2016-08-07 DIAGNOSIS — F17210 Nicotine dependence, cigarettes, uncomplicated: Secondary | ICD-10-CM

## 2016-08-07 MED ORDER — TRIAMCINOLONE ACETONIDE 0.1 % EX OINT: 1.0000 "application " | TOPICAL_OINTMENT | Freq: Two times a day (BID) | CUTANEOUS | 0 refills | Status: DC | PRN

## 2016-08-07 MED ORDER — EPINEPHRINE 0.3 MG/0.3ML IJ SOAJ: 0.3000 mg | Freq: Once | INTRAMUSCULAR | 1 refills | Status: AC

## 2016-08-07 MED ORDER — TRIAMCINOLONE ACETONIDE 0.5 % EX OINT: TOPICAL_OINTMENT | CUTANEOUS | 0 refills | Status: DC

## 2016-08-07 NOTE — Progress Notes (Signed)
BP (!) 144/76 (BP Location: Left Arm, Patient Position: Sitting, Cuff Size: Normal)   Pulse 75   Temp 99.3 F (37.4 C)   Ht 5' 9.75" (1.772 m)   Wt 297 lb 12 oz (135.1 kg)   LMP 07/08/2016   SpO2 98%   BMI 43.03 kg/m    Subjective:    Patient ID: Norma Nelson, female    DOB: 24-May-1985, 31 y.o.   MRN: [id number]  HPI: Norma Nelson is a 31 y.o. female presenting on 08/07/2016 for Follow-up   HPI   Pt long overdue for follow up. She was scheduled for January but no-showed.   Pt never got labs drawn that were to be done before the January appointment.  Pt is still going to [facility].  Pt requests something besides TAC b/c she says it isn't working any more.    Relevant past medical, surgical, family and social history reviewed and updated as indicated. Interim medical history since our last visit reviewed. Allergies and medications reviewed and updated.   Current Outpatient Prescriptions:  .  amLODipine (NORVASC) 10 MG tablet, Take 1 tablet (10 mg total) by mouth daily., Disp: 30 tablet, Rfl: 1 .  citalopram (CELEXA) 20 MG tablet, Take 20 mg by mouth daily., Disp: , Rfl:  .  metoprolol (LOPRESSOR) 50 MG tablet, Take 1 tablet (50 mg total) by mouth 2 (two) times daily., Disp: 60 tablet, Rfl: 4 .  traZODone (DESYREL) 100 MG tablet, Take 100 mg by mouth at bedtime., Disp: , Rfl:  .  EPINEPHrine 0.15 MG/0.15ML IJ injection, Inject 0.15 mLs (0.15 mg total) into the muscle as needed for anaphylaxis. (Patient not taking: Reported on 08/07/2016), Disp: 2 Device, Rfl: 0 .  triamcinolone ointment (KENALOG) 0.1 %, Apply 1 application topically as needed. (Patient not taking: Reported on 08/07/2016), Disp: 80 g, Rfl: 0   Review of Systems  Constitutional: Negative for appetite change, chills, diaphoresis, fatigue, fever and unexpected weight change.  HENT: Negative for congestion, dental problem, drooling, ear pain, facial swelling, hearing loss, mouth sores, sneezing, sore throat,  trouble swallowing and voice change.   Eyes: Negative for pain, discharge, redness, itching and visual disturbance.  Respiratory: Negative for cough, choking, shortness of breath and wheezing.   Cardiovascular: Negative for chest pain, palpitations and leg swelling.  Gastrointestinal: Negative for abdominal pain, blood in stool, constipation, diarrhea and vomiting.  Endocrine: Negative for cold intolerance, heat intolerance and polydipsia.  Genitourinary: Negative for decreased urine volume, dysuria and hematuria.  Musculoskeletal: Negative for arthralgias, back pain and gait problem.  Skin: Positive for rash.  Allergic/Immunologic: Negative for environmental allergies.  Neurological: Negative for seizures, syncope, light-headedness and headaches.  Hematological: Negative for adenopathy.  Psychiatric/Behavioral: Negative for agitation, dysphoric mood and suicidal ideas. The patient is not nervous/anxious.     Per HPI unless specifically indicated above     Objective:    BP (!) 144/76 (BP Location: Left Arm, Patient Position: Sitting, Cuff Size: Normal)   Pulse 75   Temp 99.3 F (37.4 C)   Ht 5' 9.75" (1.772 m)   Wt 297 lb 12 oz (135.1 kg)   LMP 07/08/2016   SpO2 98%   BMI 43.03 kg/m   Wt Readings from Last 3 Encounters:  08/07/16 297 lb 12 oz (135.1 kg)  07/12/16 (!) 320 lb (145.2 kg)  07/08/16 (!) 320 lb (145.2 kg)    Physical Exam  Constitutional: She is oriented to person, place, and time. She appears well-developed and  well-nourished.  HENT:  Head: Normocephalic and atraumatic.  Neck: Neck supple.  Cardiovascular: Normal rate and regular rhythm.   Pulmonary/Chest: Effort normal and breath sounds normal.  Abdominal: Soft. Bowel sounds are normal. She exhibits no mass. There is no hepatosplenomegaly. There is no tenderness.  Musculoskeletal: She exhibits no edema.  Lymphadenopathy:    She has no cervical adenopathy.  Neurological: She is alert and oriented to person,  place, and time.  Skin: Skin is warm and dry.  Eczema bilateral elbows, A&P, and areas of both lower legs  Psychiatric: She has a normal mood and affect. Her behavior is normal.  Vitals reviewed.       Assessment & Plan:     Encounter Diagnoses  Name Primary?  . Essential hypertension, benign Yes  . Prediabetes   . Leukocytosis, unspecified type   . Eczema, unspecified type   . Morbid obesity, unspecified obesity type (HCC)   . Cigarette nicotine dependence without complication     -Pt to get Labs drawn today -pt counseled about risks of high potency steroid creams long term.   Topical immunomodulators too expensive for pt.  Will rx TAC 0.5% for short term use then the pt can switch back to 0.1% for long term use -pt states epipen didn't come in the mail.  Checked- it was printed to rx sent electronically to [facility] -f/u 1 month to recheck bp

## 2016-09-04 ENCOUNTER — Ambulatory Visit (INDEPENDENT_AMBULATORY_CARE_PROVIDER_SITE_OTHER): Payer: Medicaid Other | Admitting: Adult Health

## 2016-09-04 ENCOUNTER — Other Ambulatory Visit (HOSPITAL_COMMUNITY)
Admission: RE | Admit: 2016-09-04 | Discharge: 2016-09-04 | Disposition: A | Discharge status: Home / Self Care | Payer: Self-pay | Source: Ambulatory Visit | Attending: Adult Health | Admitting: Adult Health

## 2016-09-04 ENCOUNTER — Encounter (INDEPENDENT_AMBULATORY_CARE_PROVIDER_SITE_OTHER): Payer: Self-pay

## 2016-09-04 ENCOUNTER — Encounter: Payer: Self-pay | Admitting: Adult Health

## 2016-09-04 VITALS — BP 122/70 | HR 74 | Ht 69.0 in | Wt 298.0 lb

## 2016-09-04 DIAGNOSIS — Z01419 Encounter for gynecological examination (general) (routine) without abnormal findings: Secondary | ICD-10-CM

## 2016-09-04 DIAGNOSIS — Z308 Encounter for other contraceptive management: Secondary | ICD-10-CM | POA: Diagnosis not present

## 2016-09-04 DIAGNOSIS — Z3009 Encounter for other general counseling and advice on contraception: Secondary | ICD-10-CM

## 2016-09-04 NOTE — Progress Notes (Addendum)
Patient ID: Norma Nelson, female   DOB: [birth year], 31 y.o.   MRN: [id number] History of Present Illness:  Norma Nelson is a 31 year old black female in for a well woman gyn exam and pap, she is new pt. PCP is [facility].   Current Medications, Allergies, Past Medical History, Past Surgical History, Family History and Social History were reviewed in [facility] electronic medical record.     Review of Systems: Patient denies any headaches, hearing loss, fatigue, blurred vision, shortness of breath, chest pain, abdominal pain, problems with bowel movements, urination, or intercourse(has female partner). No joint pain or mood swings.    Physical Exam:BP 122/70 (BP Location: Left Arm, Patient Position: Sitting, Cuff Size: Large)   Pulse 74   Ht 5\' 9"  (1.753 m)   Wt 298 lb (135.2 kg)   LMP 08/14/2016   BMI 44.01 kg/m  General:  Well developed, well nourished, no acute distress Skin:  Warm and dry Neck:  Midline trachea, normal thyroid, good ROM, no lymphadenopathy Lungs; Clear to auscultation bilaterally Breast:  No dominant palpable mass, retraction, or nipple discharge,has some hidradenitis under rbreasts Cardiovascular: Regular rate and rhythm Abdomen:  Soft, non tender, no hepatosplenomegaly Pelvic:  External genitalia is normal in appearance, no lesions.  The vagina is normal in appearance. Urethra has no lesions or masses. The cervix is smooth, pap with GC/CHL and HPV performed.  Uterus is felt to be normal size, shape, and contour.  No adnexal masses or tenderness noted.Bladder is non tender, no masses felt. Extremities/musculoskeletal:  No swelling or varicosities noted, no clubbing or cyanosis Psych:  No mood changes, alert and cooperative,seems happy PHQ 2 score 0.  Impression: 1. Encounter for routine gynecological examination with Papanicolaou smear of cervix   2. Family planning       Plan: Physical in 1 year Pap in 3 if normal Check [facility]  Bath with dial soap, not shower gels

## 2016-09-04 NOTE — Addendum Note (Signed)
Addended by: Federico FlakeNES, Liyana Suniga A on: 09/04/2016 02:55 PM   Modules accepted: Orders

## 2016-09-05 ENCOUNTER — Ambulatory Visit: Payer: Self-pay | Admitting: Physician Assistant

## 2016-09-05 ENCOUNTER — Encounter: Payer: Self-pay | Admitting: Physician Assistant

## 2016-09-05 VITALS — BP 130/60 | HR 77 | Temp 99.1°F | Ht 69.0 in | Wt 299.2 lb

## 2016-09-05 DIAGNOSIS — L309 Dermatitis, unspecified: Secondary | ICD-10-CM

## 2016-09-05 DIAGNOSIS — F17210 Nicotine dependence, cigarettes, uncomplicated: Secondary | ICD-10-CM

## 2016-09-05 DIAGNOSIS — D72829 Elevated white blood cell count, unspecified: Secondary | ICD-10-CM

## 2016-09-05 DIAGNOSIS — I10 Essential (primary) hypertension: Secondary | ICD-10-CM

## 2016-09-05 DIAGNOSIS — R7303 Prediabetes: Secondary | ICD-10-CM

## 2016-09-05 LAB — HIV ANTIBODY (ROUTINE TESTING W REFLEX): HIV Screen 4th Generation wRfx: NONREACTIVE

## 2016-09-05 LAB — RPR: RPR Ser Ql: NONREACTIVE

## 2016-09-05 NOTE — Progress Notes (Signed)
BP 130/60 (BP Location: Left Arm, Patient Position: Sitting, Cuff Size: Normal)   Pulse 77   Temp 99.1 F (37.3 C)   Ht 5\' 9"  (1.753 m)   Wt 299 lb 4 oz (135.7 kg)   LMP 08/14/2016   SpO2 99%   BMI 44.19 kg/m    Subjective:    Patient ID: Norma Nelson, female    DOB: [birth year], 31 y.o.   MRN: [id number]  HPI: Norma Nelson is a 31 y.o. female presenting on 09/05/2016 for Hypertension   HPI   Pt didn't get labs done- she says her mother just got dx with CA and had to get surgery.    Pt got PAP with std testing at Dr ferguson's office yesterday (she has family planning medicaid  She goes to [facility] for MH issues  Pt states Rash BLE won't clear- has used both TAC- 0.1% and 0.5%  Relevant past medical, surgical, family and social history reviewed and updated as indicated. Interim medical history since our last visit reviewed. Allergies and medications reviewed and updated.   Current Outpatient Prescriptions:  .  amLODipine (NORVASC) 10 MG tablet, Take 1 tablet (10 mg total) by mouth daily., Disp: 30 tablet, Rfl: 1 .  citalopram (CELEXA) 20 MG tablet, Take 20 mg by mouth daily., Disp: , Rfl:  .  metoprolol (LOPRESSOR) 50 MG tablet, Take 1 tablet (50 mg total) by mouth 2 (two) times daily., Disp: 60 tablet, Rfl: 4 .  traZODone (DESYREL) 100 MG tablet, Take 100 mg by mouth at bedtime., Disp: , Rfl:  .  triamcinolone ointment (KENALOG) 0.1 %, Apply 1 application topically 2 (two) times daily as needed., Disp: 80 g, Rfl: 0 .  triamcinolone ointment (KENALOG) 0.5 %, Use sparingly on affected area qd-bid prn, Disp: 15 g, Rfl: 0   Review of Systems  Constitutional: Negative for appetite change, chills, diaphoresis, fatigue, fever and unexpected weight change.  HENT: Negative for congestion, dental problem, drooling, ear pain, facial swelling, hearing loss, mouth sores, sneezing, sore throat, trouble swallowing and voice change.   Eyes: Negative for pain, discharge, redness,  itching and visual disturbance.  Respiratory: Negative for cough, choking, shortness of breath and wheezing.   Cardiovascular: Negative for chest pain, palpitations and leg swelling.  Gastrointestinal: Negative for abdominal pain, blood in stool, constipation, diarrhea and vomiting.  Endocrine: Negative for cold intolerance, heat intolerance and polydipsia.  Genitourinary: Negative for decreased urine volume, dysuria and hematuria.  Musculoskeletal: Negative for arthralgias, back pain and gait problem.  Skin: Negative for rash.  Allergic/Immunologic: Negative for environmental allergies.  Neurological: Negative for seizures, syncope, light-headedness and headaches.  Hematological: Negative for adenopathy.  Psychiatric/Behavioral: Negative for agitation, dysphoric mood and suicidal ideas. The patient is not nervous/anxious.     Per HPI unless specifically indicated above     Objective:    BP 130/60 (BP Location: Left Arm, Patient Position: Sitting, Cuff Size: Normal)   Pulse 77   Temp 99.1 F (37.3 C)   Ht 5\' 9"  (1.753 m)   Wt 299 lb 4 oz (135.7 kg)   LMP 08/14/2016   SpO2 99%   BMI 44.19 kg/m   Wt Readings from Last 3 Encounters:  09/05/16 299 lb 4 oz (135.7 kg)  09/04/16 298 lb (135.2 kg)  08/07/16 297 lb 12 oz (135.1 kg)    Physical Exam  Constitutional: She is oriented to person, place, and time. She appears well-developed and well-nourished.  HENT:  Head: Normocephalic and atraumatic.  Neck: Neck supple.  Cardiovascular: Normal rate and regular rhythm.   Pulmonary/Chest: Effort normal and breath sounds normal.  Abdominal: Soft. Bowel sounds are normal. She exhibits no mass. There is no hepatosplenomegaly. There is no tenderness.  Musculoskeletal: She exhibits no edema.  Lymphadenopathy:    She has no cervical adenopathy.  Neurological: She is alert and oriented to person, place, and time.  Skin: Skin is warm and dry. Rash noted.  Thickened bumpy skin bilateral lower  legs  Psychiatric: She has a normal mood and affect. Her behavior is normal.  Vitals reviewed.       Assessment & Plan:   Encounter Diagnoses  Name Primary?  . Essential hypertension, benign Yes  . Prediabetes   . Cigarette nicotine dependence without complication   . Morbid obesity (HCC)   . Eczema, unspecified type   . Leukocytosis, unspecified type     -pt to continue current medications -Pt to get labs drawn. Will call with results -will refer to dermatology for persistent eczema which has failed to improve -pt to follow up in 3 months. RTO sooner prn

## 2016-09-09 ENCOUNTER — Encounter: Payer: Self-pay | Admitting: Adult Health

## 2016-09-09 ENCOUNTER — Telehealth: Payer: Self-pay | Admitting: Adult Health

## 2016-09-09 DIAGNOSIS — R87810 Cervical high risk human papillomavirus (HPV) DNA test positive: Secondary | ICD-10-CM

## 2016-09-09 HISTORY — DX: Cervical high risk human papillomavirus (HPV) DNA test positive: R87.810

## 2016-09-09 LAB — CYTOLOGY - PAP
Chlamydia: NEGATIVE
Diagnosis: NEGATIVE
HPV: DETECTED — AB
Neisseria Gonorrhea: NEGATIVE

## 2016-09-09 NOTE — Telephone Encounter (Signed)
Pt aware pap negative for malignancy and CG/CHL but +HPV, repeat pap in 1 year

## 2016-09-09 NOTE — Telephone Encounter (Signed)
No answer

## 2016-09-10 ENCOUNTER — Emergency Department (HOSPITAL_COMMUNITY)
Admission: EM | Admit: 2016-09-10 | Discharge: 2016-09-11 | Disposition: A | Discharge status: Home / Self Care | Payer: Medicaid Other | Attending: Emergency Medicine | Admitting: Emergency Medicine

## 2016-09-10 ENCOUNTER — Encounter (HOSPITAL_COMMUNITY): Payer: Self-pay | Admitting: Emergency Medicine

## 2016-09-10 ENCOUNTER — Emergency Department (HOSPITAL_COMMUNITY): Payer: Medicaid Other

## 2016-09-10 DIAGNOSIS — W182XXA Fall in (into) shower or empty bathtub, initial encounter: Secondary | ICD-10-CM | POA: Insufficient documentation

## 2016-09-10 DIAGNOSIS — F17210 Nicotine dependence, cigarettes, uncomplicated: Secondary | ICD-10-CM | POA: Insufficient documentation

## 2016-09-10 DIAGNOSIS — S5012XA Contusion of left forearm, initial encounter: Secondary | ICD-10-CM | POA: Insufficient documentation

## 2016-09-10 DIAGNOSIS — Y999 Unspecified external cause status: Secondary | ICD-10-CM | POA: Insufficient documentation

## 2016-09-10 DIAGNOSIS — I10 Essential (primary) hypertension: Secondary | ICD-10-CM | POA: Insufficient documentation

## 2016-09-10 DIAGNOSIS — Y939 Activity, unspecified: Secondary | ICD-10-CM | POA: Insufficient documentation

## 2016-09-10 DIAGNOSIS — S76112A Strain of left quadriceps muscle, fascia and tendon, initial encounter: Secondary | ICD-10-CM

## 2016-09-10 DIAGNOSIS — Y929 Unspecified place or not applicable: Secondary | ICD-10-CM | POA: Insufficient documentation

## 2016-09-10 DIAGNOSIS — Z79899 Other long term (current) drug therapy: Secondary | ICD-10-CM | POA: Insufficient documentation

## 2016-09-10 DIAGNOSIS — W19XXXA Unspecified fall, initial encounter: Secondary | ICD-10-CM

## 2016-09-10 MED ORDER — OXYCODONE-ACETAMINOPHEN 5-325 MG PO TABS: 1.0000 | ORAL_TABLET | ORAL | 0 refills | Status: DC | PRN

## 2016-09-10 MED ORDER — OXYCODONE-ACETAMINOPHEN 5-325 MG PO TABS
1.0000 | ORAL_TABLET | Freq: Once | ORAL | Status: AC
  Administered 2016-09-10: 1 via ORAL
  Filled 2016-09-10: qty 1

## 2016-09-10 MED ORDER — METHOCARBAMOL 500 MG PO TABS: 1000.0000 mg | ORAL_TABLET | Freq: Four times a day (QID) | ORAL | 0 refills | Status: AC

## 2016-09-10 NOTE — ED Triage Notes (Signed)
Pt states she fell yesterday in the shower.  Having left arm and hip pain

## 2016-09-10 NOTE — Discharge Instructions (Signed)
You may take the oxycodone prescribed for pain relief.  This will make you drowsy - do not drive within 4 hours of taking this medication.  Also I recommend applying ice as much as possible for the next 2 days to help reduce swelling.  You may start using a heating pad for 20 minutes several times daily starting on Friday.  Your xrays are negative tonight.

## 2016-09-10 NOTE — ED Notes (Signed)
Xray notified that pt would like to sign a waiver and not take preg test

## 2016-09-12 ENCOUNTER — Other Ambulatory Visit (HOSPITAL_COMMUNITY)
Admission: RE | Admit: 2016-09-12 | Discharge: 2016-09-12 | Disposition: A | Discharge status: Home / Self Care | Payer: Medicaid Other | Source: Ambulatory Visit | Attending: Physician Assistant | Admitting: Physician Assistant

## 2016-09-12 LAB — BASIC METABOLIC PANEL
Anion gap: 6 (ref 5–15)
BUN: 9 mg/dL (ref 6–20)
CO2: 26 mmol/L (ref 22–32)
Calcium: 9.2 mg/dL (ref 8.9–10.3)
Chloride: 105 mmol/L (ref 101–111)
Creatinine, Ser: 0.84 mg/dL (ref 0.44–1.00)
GFR calc Af Amer: >60 mL/min (ref >60)
GFR calc non Af Amer: >60 mL/min (ref >60)
Glucose, Bld: 110 mg/dL — ABNORMAL HIGH (ref 65–99)
Potassium: 3.5 mmol/L (ref 3.5–5.1)
Sodium: 137 mmol/L (ref 135–145)

## 2016-09-12 LAB — CBC
HCT: 40.8 % (ref 36.0–46.0)
Hemoglobin: 13.2 g/dL (ref 12.0–15.0)
MCH: 27.2 pg (ref 26.0–34.0)
MCHC: 32.4 g/dL (ref 30.0–36.0)
MCV: 84 fL (ref 78.0–100.0)
Platelets: 405 10*3/uL — ABNORMAL HIGH (ref 150–400)
RBC: 4.86 MIL/uL (ref 3.87–5.11)
RDW: 14.4 % (ref 11.5–15.5)
WBC: 17.3 10*3/uL — ABNORMAL HIGH (ref 4.0–10.5)

## 2016-09-12 NOTE — ED Provider Notes (Signed)
[facility]-EMERGENCY DEPT Provider Note   CSN: [id number] Arrival date & time: 09/10/16  2130     History   Chief Complaint Chief Complaint  Patient presents with  . Fall    HPI Norma Nelson is a 31 y.o. female presenting with complaint of persistent pain at her left upper thigh since slipping and falling in the shower yesterday. She fell on her left hip, and denies head or neck injury or pain. She also has a bruise on her left mid forearm but denies significant pain at this site.  She denies numbness or weakness in her extremities, denies lumbar pain, no n/v or other complaint. She has used ice and rest without relief of pain.  Her pain is worsened with attempts to flex the hip and better at rest.    The history is provided by the patient.    Past Medical History:  Diagnosis Date  . Bronchitis   . Eczema   . Hypertension   . Leukocytosis 2012   chronic  . Mental disorder    trouble sleeping,  . Papanicolaou smear of cervix with positive high risk human papilloma virus (HPV) test 09/09/2016    Patient Active Problem List   Diagnosis Date Noted  . Papanicolaou smear of cervix with positive high risk human papilloma virus (HPV) test 09/09/2016  . Leukocytosis 10/19/2015  . Prediabetes 10/19/2015  . Essential hypertension, benign 09/14/2015  . Morbid obesity (HCC) 09/14/2015  . Cigarette nicotine dependence without complication 09/14/2015    History reviewed. No pertinent surgical history.  OB History    Gravida Para Term Preterm AB Living   0 0 0 0 0 0   SAB TAB Ectopic Multiple Live Births   0 0 0 0 0       Home Medications    Prior to Admission medications   Medication Sig Start Date End Date Taking? Authorizing Provider  amLODipine (NORVASC) 10 MG tablet Take 1 tablet (10 mg total) by mouth daily. 03/28/16   Jacquelin Hawking, PA-C  citalopram (CELEXA) 20 MG tablet Take 20 mg by mouth daily.    [provider]  methocarbamol (ROBAXIN) 500 MG tablet  Take 2 tablets (1,000 mg total) by mouth 4 (four) times daily. 09/10/16 09/20/16  Burgess Amor, PA-C  metoprolol (LOPRESSOR) 50 MG tablet Take 1 tablet (50 mg total) by mouth 2 (two) times daily. 01/24/16   Jacquelin Hawking, PA-C  oxyCODONE-acetaminophen (PERCOCET/ROXICET) 5-325 MG tablet Take 1 tablet by mouth every 4 (four) hours as needed. 09/10/16   Burgess Amor, PA-C  traZODone (DESYREL) 100 MG tablet Take 100 mg by mouth at bedtime.    [provider]  triamcinolone ointment (KENALOG) 0.1 % Apply 1 application topically 2 (two) times daily as needed. 08/07/16   Jacquelin Hawking, PA-C  triamcinolone ointment (KENALOG) 0.5 % Use sparingly on affected area qd-bid prn 08/07/16   Jacquelin Hawking, PA-C    Family History Family History  Problem Relation Age of Onset  . Seizures Mother   . Hypertension Mother   . Diabetes Mother   . Stroke Mother   . Heart disease Mother   . Hypertension Maternal Uncle   . Hypertension Maternal Uncle   . Hypertension Maternal Uncle   . Heart attack Maternal Uncle   . Hypertension Maternal Uncle     Social History Social History  Substance Use Topics  . Smoking status: Current Every Day Smoker    Packs/day: 0.50    Years: 13.00    Types:  Cigarettes  . Smokeless tobacco: Never Used  . Alcohol use Yes     Comment: occ     Allergies   Hydrocodone; Lisinopril; Naproxen; Shellfish allergy; and Tramadol   Review of Systems Review of Systems  Constitutional: Negative for fever.  Musculoskeletal: Positive for arthralgias and joint swelling. Negative for myalgias.  Skin: Positive for color change. Negative for wound.  Neurological: Negative for weakness and numbness.     Physical Exam Updated Vital Signs BP (!) 142/76 (BP Location: Right Arm)   Pulse 69   Temp 97.5 F (36.4 C) (Oral)   Resp 19   Ht 5\' 9"  (1.753 m)   Wt 134.7 kg (297 lb)   LMP 09/10/2016 Comment: Patient did not want pregnancy test, waiver signed today 09/10/16  SpO2 97%    BMI 43.86 kg/m   Physical Exam  Constitutional: She appears well-developed and well-nourished.  HENT:  Head: Atraumatic.  Neck: Normal range of motion.  Cardiovascular:  Pulses equal bilaterally  Musculoskeletal: She exhibits tenderness.       Left hip: She exhibits tenderness. She exhibits normal strength, no bony tenderness, no swelling, no crepitus and no deformity.       Legs: ttp anterior proximal left musculature without palpable disruptions or deformity, thigh is soft.  No pain with hip inversion/eversion.  Pain present with left hip flexion/SLR.  Left knee nontender and no edema.  Small hematoma left mid volar left forearm without bony ttp.  Neurological: She is alert. She has normal strength. She displays normal reflexes. No sensory deficit.  Skin: Skin is warm and dry.  Psychiatric: She has a normal mood and affect.     ED Treatments / Results  Labs (all labs ordered are listed, but only abnormal results are displayed) Labs Reviewed - No data to display  EKG  EKG Interpretation None       Radiology Dg Hip Unilat W Or Wo Pelvis 2-3 Views Left  Result Date: 09/10/2016 CLINICAL DATA:  Fall with left hip pain EXAM: DG HIP (WITH OR WITHOUT PELVIS) 2-3V LEFT COMPARISON:  None. FINDINGS: SI joints are symmetric. Pubic symphysis is intact. The rami appear within normal limits. No fracture or malalignment. Mild osteophytosis at the femoral head neck junction. IMPRESSION: No acute osseous abnormality. Electronically Signed   By: Jasmine Pang M.D.   On: 09/10/2016 23:38    Procedures Procedures (including critical care time)  Medications Ordered in ED Medications  oxyCODONE-acetaminophen (PERCOCET/ROXICET) 5-325 MG per tablet 1 tablet (1 tablet Oral Given 09/10/16 2237)     Initial Impression / Assessment and Plan / ED Course  I have reviewed the triage vital signs and the nursing notes.  Pertinent labs & imaging results that were available during my care of the  patient were reviewed by me and considered in my medical decision making (see chart for details).     Pt with suspected quadriceps strain, negative imaging. Thigh is soft, doubt compartment syndrome.  Discussed ice/heat. Prn f/u with pcp if sx not improving with tx.  Final Clinical Impressions(s) / ED Diagnoses   Final diagnoses:  Fall, initial encounter  Quadriceps muscle strain, left, initial encounter    New Prescriptions Discharge Medication List as of 09/10/2016 11:53 PM    START taking these medications   Details  methocarbamol (ROBAXIN) 500 MG tablet Take 2 tablets (1,000 mg total) by mouth 4 (four) times daily., Starting Tue 09/10/2016, Until Fri 09/20/2016, Print    oxyCODONE-acetaminophen (PERCOCET/ROXICET) 5-325 MG tablet Take 1  tablet by mouth every 4 (four) hours as needed., Starting Tue 09/10/2016, Print         Burgess Amordol, Kytzia Gienger, PA-C 09/12/16 1351    Geoffery Lyonselo, Douglas, MD 09/17/16 [phone]

## 2016-09-13 LAB — HEMOGLOBIN A1C
Hgb A1c MFr Bld: 6.1 % — ABNORMAL HIGH (ref 4.8–5.6)
Mean Plasma Glucose: 128 mg/dL

## 2016-10-06 ENCOUNTER — Emergency Department (HOSPITAL_COMMUNITY)
Admission: EM | Admit: 2016-10-06 | Discharge: 2016-10-06 | Disposition: A | Discharge status: Home / Self Care | Payer: Self-pay | Attending: Emergency Medicine | Admitting: Emergency Medicine

## 2016-10-06 ENCOUNTER — Encounter (HOSPITAL_COMMUNITY): Payer: Self-pay | Admitting: Adult Health

## 2016-10-06 ENCOUNTER — Emergency Department (HOSPITAL_COMMUNITY): Payer: Self-pay

## 2016-10-06 DIAGNOSIS — R51 Headache: Secondary | ICD-10-CM | POA: Insufficient documentation

## 2016-10-06 DIAGNOSIS — Y9389 Activity, other specified: Secondary | ICD-10-CM | POA: Insufficient documentation

## 2016-10-06 DIAGNOSIS — F17210 Nicotine dependence, cigarettes, uncomplicated: Secondary | ICD-10-CM | POA: Insufficient documentation

## 2016-10-06 DIAGNOSIS — S40021A Contusion of right upper arm, initial encounter: Secondary | ICD-10-CM | POA: Insufficient documentation

## 2016-10-06 DIAGNOSIS — T07XXXA Unspecified multiple injuries, initial encounter: Secondary | ICD-10-CM

## 2016-10-06 DIAGNOSIS — Y929 Unspecified place or not applicable: Secondary | ICD-10-CM | POA: Insufficient documentation

## 2016-10-06 DIAGNOSIS — R6884 Jaw pain: Secondary | ICD-10-CM | POA: Insufficient documentation

## 2016-10-06 DIAGNOSIS — Y999 Unspecified external cause status: Secondary | ICD-10-CM | POA: Insufficient documentation

## 2016-10-06 MED ORDER — OXYCODONE-ACETAMINOPHEN 5-325 MG PO TABS
1.0000 | ORAL_TABLET | ORAL | 0 refills | Status: DC | PRN
Start: 2016-10-06 — End: 2017-10-27

## 2016-10-06 MED ORDER — OXYCODONE-ACETAMINOPHEN 5-325 MG PO TABS
1.0000 | ORAL_TABLET | Freq: Once | ORAL | Status: AC
  Administered 2016-10-06: 1 via ORAL
  Filled 2016-10-06: qty 1
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
## 2016-10-06 NOTE — Discharge Instructions (Signed)
Use heat on the sore areas 3 or 4 times a day.  See your doctor if needed for problems.

## 2016-10-06 NOTE — ED Notes (Signed)
Pt a&o x 4, RX/verbal/written discharge instructions given, pt verbalized understanding, pt ambulated off unit at this time in good condition with steady gait
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
## 2016-10-06 NOTE — ED Notes (Signed)
Ginger ale, graham crackers and PB given

## 2016-10-07 ENCOUNTER — Encounter: Payer: Self-pay | Admitting: Physician Assistant

## 2016-10-07 ENCOUNTER — Ambulatory Visit: Payer: Self-pay | Admitting: Physician Assistant

## 2016-10-09 ENCOUNTER — Encounter: Payer: Self-pay | Admitting: Physician Assistant

## 2016-12-09 ENCOUNTER — Ambulatory Visit: Payer: Self-pay | Admitting: Physician Assistant

## 2017-02-11 ENCOUNTER — Encounter (HOSPITAL_COMMUNITY): Payer: Self-pay | Admitting: *Deleted

## 2017-02-11 ENCOUNTER — Emergency Department (HOSPITAL_COMMUNITY)
Admission: EM | Admit: 2017-02-11 | Discharge: 2017-02-11 | Disposition: A | Discharge status: Home / Self Care | Payer: Self-pay | Attending: Emergency Medicine | Admitting: Emergency Medicine

## 2017-02-11 DIAGNOSIS — Z79899 Other long term (current) drug therapy: Secondary | ICD-10-CM | POA: Insufficient documentation

## 2017-02-11 DIAGNOSIS — F17210 Nicotine dependence, cigarettes, uncomplicated: Secondary | ICD-10-CM | POA: Insufficient documentation

## 2017-02-11 DIAGNOSIS — Z791 Long term (current) use of non-steroidal anti-inflammatories (NSAID): Secondary | ICD-10-CM | POA: Insufficient documentation

## 2017-02-11 DIAGNOSIS — I10 Essential (primary) hypertension: Secondary | ICD-10-CM | POA: Insufficient documentation

## 2017-02-11 DIAGNOSIS — A5901 Trichomonal vulvovaginitis: Secondary | ICD-10-CM | POA: Insufficient documentation

## 2017-02-11 DIAGNOSIS — K0889 Other specified disorders of teeth and supporting structures: Secondary | ICD-10-CM | POA: Insufficient documentation

## 2017-02-11 LAB — URINALYSIS, ROUTINE W REFLEX MICROSCOPIC
Bilirubin Urine: NEGATIVE
Glucose, UA: NEGATIVE mg/dL
Ketones, ur: 5 mg/dL — AB
Nitrite: NEGATIVE
Protein, ur: 30 mg/dL — AB
Specific Gravity, Urine: 1.026 (ref 1.005–1.030)
pH: 5 (ref 5.0–8.0)

## 2017-02-11 LAB — WET PREP, GENITAL
Clue Cells Wet Prep HPF POC: NONE SEEN
Sperm: NONE SEEN
Yeast Wet Prep HPF POC: NONE SEEN

## 2017-02-11 LAB — PREGNANCY, URINE: Preg Test, Ur: NEGATIVE

## 2017-02-11 MED ORDER — PENICILLIN V POTASSIUM 250 MG PO TABS
500.0000 mg | ORAL_TABLET | Freq: Once | ORAL | Status: AC
  Administered 2017-02-11: 500 mg via ORAL
  Filled 2017-02-11: qty 2

## 2017-02-11 MED ORDER — IBUPROFEN 800 MG PO TABS
800.0000 mg | ORAL_TABLET | Freq: Once | ORAL | Status: DC
  Filled 2017-02-11: qty 1

## 2017-02-11 MED ORDER — METRONIDAZOLE 500 MG PO TABS: 500.0000 mg | ORAL_TABLET | Freq: Two times a day (BID) | ORAL | 0 refills | Status: DC

## 2017-02-11 MED ORDER — IBUPROFEN 800 MG PO TABS: 800.0000 mg | ORAL_TABLET | Freq: Three times a day (TID) | ORAL | 0 refills | Status: DC

## 2017-02-11 MED ORDER — PENICILLIN V POTASSIUM 500 MG PO TABS: 500.0000 mg | ORAL_TABLET | Freq: Four times a day (QID) | ORAL | 0 refills | Status: AC

## 2017-02-11 NOTE — ED Triage Notes (Signed)
Pt c/o vaginal itching that started today; and pt c/o toothache to both sides of mouth

## 2017-02-11 NOTE — Discharge Instructions (Signed)
Follow up with a primary doctor and a dentist. Do not drink alcohol while taking metronidazole. Return to the ED if you develop new or worsening symptoms.

## 2017-02-11 NOTE — ED Provider Notes (Signed)
[facility] EMERGENCY DEPARTMENT Provider Note   CSN: [id number] Arrival date & time: 02/11/17  0020     History   Chief Complaint Chief Complaint  Patient presents with  . Vaginal Itching    HPI Norma Nelson is a 31 y.o. female.  Patient states "I think it had a yeast infection" because of vaginal irritation for the past 2 days.  States she used over-the-counter Monistat without relief.  Has not had any vaginal discharge her menstrual cycle is just finishing.  She admits to using a eczema body wash that may have gotten near her vagina caused irritation.  Has some pain with urination.  No blood in the urine.  No fever.  No abdominal pain, nausea or vomiting.  Also complains of ongoing toothache that has been a problem for quite some time.  Denies any difficulty breathing or difficulty swallowing.  No chest pain or shortness of breath.  No fever.   The history is provided by the patient.  Vaginal Itching  Pertinent negatives include no chest pain, no abdominal pain, no headaches and no shortness of breath.    Past Medical History:  Diagnosis Date  . Bronchitis   . Eczema   . Hypertension   . Leukocytosis 2012   chronic  . Mental disorder    trouble sleeping,  . Papanicolaou smear of cervix with positive high risk human papilloma virus (HPV) test 09/09/2016    Patient Active Problem List   Diagnosis Date Noted  . Papanicolaou smear of cervix with positive high risk human papilloma virus (HPV) test 09/09/2016  . Leukocytosis 10/19/2015  . Prediabetes 10/19/2015  . Essential hypertension, benign 09/14/2015  . Morbid obesity (HCC) 09/14/2015  . Cigarette nicotine dependence without complication 09/14/2015    History reviewed. No pertinent surgical history.  OB History    Gravida Para Term Preterm AB Living   0 0 0 0 0 0   SAB TAB Ectopic Multiple Live Births   0 0 0 0 0       Home Medications    Prior to Admission medications   Medication Sig Start Date  End Date Taking? Authorizing Provider  amLODipine (NORVASC) 10 MG tablet Take 1 tablet (10 mg total) by mouth daily. 03/28/16   Jacquelin Hawking, PA-C  AUVI-Q 0.3 MG/0.3ML SOAJ injection Inject 1 pen as directed daily as needed (allergic reaction).  08/15/16   [provider]  citalopram (CELEXA) 20 MG tablet Take 20 mg by mouth daily.    [provider]  diphenhydrAMINE (BENADRYL) 25 MG tablet Take 50 mg by mouth every 6 (six) hours as needed for itching or allergies.    [provider]  ibuprofen (ADVIL,MOTRIN) 200 MG tablet Take 200 mg by mouth every 6 (six) hours as needed for headache, mild pain or moderate pain.    [provider]  metoprolol (LOPRESSOR) 50 MG tablet Take 1 tablet (50 mg total) by mouth 2 (two) times daily. 01/24/16   Jacquelin Hawking, PA-C  oxyCODONE-acetaminophen (PERCOCET) 5-325 MG tablet Take 1 tablet by mouth every 4 (four) hours as needed. 10/06/16   Mancel Bale, MD  traZODone (DESYREL) 100 MG tablet Take 100 mg by mouth at bedtime.    [provider]  triamcinolone ointment (KENALOG) 0.1 % Apply 1 application topically 2 (two) times daily as needed. 08/07/16   Jacquelin Hawking, PA-C    Family History Family History  Problem Relation Age of Onset  . Seizures Mother   . Hypertension Mother   .  Diabetes Mother   . Stroke Mother   . Heart disease Mother   . Hypertension Maternal Uncle   . Hypertension Maternal Uncle   . Hypertension Maternal Uncle   . Heart attack Maternal Uncle   . Hypertension Maternal Uncle     Social History Social History  Substance Use Topics  . Smoking status: Current Every Day Smoker    Packs/day: 0.10    Years: 13.00    Types: Cigarettes  . Smokeless tobacco: Never Used  . Alcohol use Yes     Comment: occ     Allergies   Hydrocodone; Lisinopril; Naproxen; Shellfish allergy; and Tramadol   Review of Systems Review of Systems  Constitutional: Negative for activity change,  appetite change, fatigue and fever.  HENT: Positive for dental problem. Negative for congestion and rhinorrhea.   Eyes: Negative for visual disturbance.  Respiratory: Negative for cough, chest tightness and shortness of breath.   Cardiovascular: Negative for chest pain.  Gastrointestinal: Negative for abdominal pain, nausea and vomiting.  Genitourinary: Positive for dysuria, urgency, vaginal discharge and vaginal pain. Negative for flank pain and hematuria.  Musculoskeletal: Negative for arthralgias and myalgias.  Skin: Negative for rash.  Neurological: Negative for dizziness, weakness and headaches.   all other systems are negative except as noted in the HPI and PMH.     Physical Exam Updated Vital Signs BP (!) 155/75   Pulse 83   Temp 98 F (36.7 C) (Oral)   Resp 17   Ht 5\' 9"  (1.753 m)   Wt 135.2 kg (298 lb)   LMP 02/08/2017   SpO2 100%   BMI 44.01 kg/m   Physical Exam  Constitutional: She is oriented to person, place, and time. She appears well-developed and well-nourished. No distress.  HENT:  Head: Normocephalic and atraumatic.  Mouth/Throat: Oropharynx is clear and moist. No oropharyngeal exudate.  There is chronic appearing fracture of right lower first molar as well as left upper incisor. No abscess.  Floor of mouth is soft  Eyes: Pupils are equal, round, and reactive to light. Conjunctivae and EOM are normal.  Neck: Normal range of motion. Neck supple.  No meningismus.  Cardiovascular: Normal rate, regular rhythm, normal heart sounds and intact distal pulses.   No murmur heard. Pulmonary/Chest: Effort normal and breath sounds normal. No respiratory distress.  Abdominal: Soft. There is no tenderness. There is no rebound and no guarding.  Genitourinary:  Genitourinary Comments: Chaperone present.  Normal external genitalia.  White discharge in vaginal vault.  No CMT.  No lateralizing adnexal tenderness No external genital lesions  Musculoskeletal: Normal range of  motion. She exhibits no edema or tenderness.  Neurological: She is alert and oriented to person, place, and time. No cranial nerve deficit. She exhibits normal muscle tone. Coordination normal.   5/5 strength throughout. CN 2-12 intact.Equal grip strength.   Skin: Skin is warm.  Psychiatric: She has a normal mood and affect. Her behavior is normal.  Nursing note and vitals reviewed.    ED Treatments / Results  Labs (all labs ordered are listed, but only abnormal results are displayed) Labs Reviewed  WET PREP, GENITAL - Abnormal; Notable for the following:       Result Value   Trich, Wet Prep PRESENT (*)    WBC, Wet Prep HPF POC MANY (*)    All other components within normal limits  URINALYSIS, ROUTINE W REFLEX MICROSCOPIC - Abnormal; Notable for the following:    APPearance CLOUDY (*)  Hgb urine dipstick SMALL (*)    Ketones, ur 5 (*)    Protein, ur 30 (*)    Leukocytes, UA LARGE (*)    Bacteria, UA FEW (*)    Squamous Epithelial / LPF 6-30 (*)    All other components within normal limits  URINE CULTURE  PREGNANCY, URINE  GC/CHLAMYDIA PROBE AMP ([place]    EKG  EKG Interpretation None       Radiology No results found.  Procedures Procedures (including critical care time)  Medications Ordered in ED Medications - No data to display   Initial Impression / Assessment and Plan / ED Course  I have reviewed the triage vital signs and the nursing notes.  Pertinent labs & imaging results that were available during my care of the patient were reviewed by me and considered in my medical decision making (see chart for details).     Patient with vaginal discharge and irritation. Abdomen benign.  Pelvic exam as above.  Patient will be treated for trichomonas. UA equivocal. Urine culture sent.  Patient's tooth pain appears to be chronic with evidence of chronic fractures but no abscess.  No evidence of Ludwig's angina.  Will treat with nonnarcotic pain  medication, antibiotics and dental follow-up.  Return precautions discussed. Final Clinical Impressions(s) / ED Diagnoses   Final diagnoses:  Trichomonal vaginitis  Pain, dental    New Prescriptions New Prescriptions   No medications on file     Glynn Octaveancour, Dmarius Reeder, MD 02/11/17 [phone]

## 2017-02-11 NOTE — ED Notes (Signed)
ED Provider at bedside. 

## 2017-02-12 LAB — URINE CULTURE: Culture: NO GROWTH

## 2017-02-12 LAB — GC/CHLAMYDIA PROBE AMP (~~LOC~~) NOT AT ARMC
Chlamydia: NEGATIVE
Neisseria Gonorrhea: POSITIVE — AB

## 2017-02-25 ENCOUNTER — Emergency Department (HOSPITAL_COMMUNITY)
Admission: EM | Admit: 2017-02-25 | Discharge: 2017-02-25 | Disposition: A | Discharge status: Home / Self Care | Payer: Self-pay | Attending: Emergency Medicine | Admitting: Emergency Medicine

## 2017-02-25 ENCOUNTER — Other Ambulatory Visit: Payer: Self-pay

## 2017-02-25 ENCOUNTER — Encounter (HOSPITAL_COMMUNITY): Payer: Self-pay | Admitting: Emergency Medicine

## 2017-02-25 DIAGNOSIS — A549 Gonococcal infection, unspecified: Secondary | ICD-10-CM | POA: Insufficient documentation

## 2017-02-25 DIAGNOSIS — Z79899 Other long term (current) drug therapy: Secondary | ICD-10-CM | POA: Insufficient documentation

## 2017-02-25 DIAGNOSIS — F17210 Nicotine dependence, cigarettes, uncomplicated: Secondary | ICD-10-CM | POA: Insufficient documentation

## 2017-02-25 DIAGNOSIS — R87810 Cervical high risk human papillomavirus (HPV) DNA test positive: Secondary | ICD-10-CM | POA: Insufficient documentation

## 2017-02-25 DIAGNOSIS — I10 Essential (primary) hypertension: Secondary | ICD-10-CM | POA: Insufficient documentation

## 2017-02-25 HISTORY — DX: Trichomoniasis, unspecified: A59.9

## 2017-02-25 HISTORY — DX: Gonococcal infection, unspecified: A54.9

## 2017-02-25 MED ORDER — IBUPROFEN 800 MG PO TABS
800.0000 mg | ORAL_TABLET | Freq: Once | ORAL | Status: AC
  Administered 2017-02-25: 800 mg via ORAL
  Filled 2017-02-25: qty 1

## 2017-02-25 MED ORDER — AZITHROMYCIN 250 MG PO TABS
1000.0000 mg | ORAL_TABLET | Freq: Once | ORAL | Status: AC
  Administered 2017-02-25: 1000 mg via ORAL
  Filled 2017-02-25: qty 4

## 2017-02-25 MED ORDER — ONDANSETRON 8 MG PO TBDP
8.0000 mg | ORAL_TABLET | Freq: Once | ORAL | Status: AC
  Administered 2017-02-25: 8 mg via ORAL
  Filled 2017-02-25: qty 1

## 2017-02-25 MED ORDER — CEFTRIAXONE SODIUM 250 MG IJ SOLR
250.0000 mg | Freq: Once | INTRAMUSCULAR | Status: AC
  Administered 2017-02-25: 250 mg via INTRAMUSCULAR
  Filled 2017-02-25: qty 250

## 2017-02-25 MED ORDER — LIDOCAINE HCL (PF) 1 % IJ SOLN
INTRAMUSCULAR | Status: AC
  Administered 2017-02-25: 5 mL
  Filled 2017-02-25: qty 5
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
## 2017-02-25 NOTE — ED Triage Notes (Signed)
Pt here last week for possible std and dx with trich. staes was called by hospitial today and stated she also needed to be treated for gonorrhea.

## 2017-02-25 NOTE — ED Provider Notes (Signed)
[facility] Provider Note   CSN: [id number] Arrival date & time: 02/25/17  1622     History   Chief Complaint Chief Complaint  Patient presents with  . Exposure to STD    HPI Norma Nelson is a 31 y.o. female.  HPI   Norma Nelson is a 31 y.o. female who presents to the Emergency Department requesting treatment for gonorrhea.  She was seen here on 02/11/17 and treated for trichomonal vaginitis.  Returns today stating that she was contacted by the hospital regarding a positive test result for gonorrhea from her previous visit.  She reports a continued mild vaginal irritation.  She denies abdominal pain, fever, vaginal bleeding or discharge and dysuria.     Past Medical History:  Diagnosis Date  . Bronchitis   . Eczema   . Gonorrhea   . Hypertension   . Leukocytosis 2012   chronic  . Mental disorder    trouble sleeping,  . Papanicolaou smear of cervix with positive high risk human papilloma virus (HPV) test 09/09/2016  . Trichomonosis     Patient Active Problem List   Diagnosis Date Noted  . Papanicolaou smear of cervix with positive high risk human papilloma virus (HPV) test 09/09/2016  . Leukocytosis 10/19/2015  . Prediabetes 10/19/2015  . Essential hypertension, benign 09/14/2015  . Morbid obesity (HCC) 09/14/2015  . Cigarette nicotine dependence without complication 09/14/2015    History reviewed. No pertinent surgical history.  OB History    Gravida Para Term Preterm AB Living   0 0 0 0 0 0   SAB TAB Ectopic Multiple Live Births   0 0 0 0 0       Home Medications    Prior to Admission medications   Medication Sig Start Date End Date Taking? Authorizing Provider  amLODipine (NORVASC) 10 MG tablet Take 1 tablet (10 mg total) by mouth daily. 03/28/16   Jacquelin HawkingMcElroy, Shannon, PA-C  AUVI-Q 0.3 MG/0.3ML SOAJ injection Inject 1 pen as directed daily as needed (allergic reaction).  08/15/16   [provider]  citalopram (CELEXA)  20 MG tablet Take 20 mg by mouth daily.    [provider]  diphenhydrAMINE (BENADRYL) 25 MG tablet Take 50 mg by mouth every 6 (six) hours as needed for itching or allergies.    [provider]  ibuprofen (ADVIL,MOTRIN) 800 MG tablet Take 1 tablet (800 mg total) by mouth 3 (three) times daily. 02/11/17   Rancour, Jeannett SeniorStephen, MD  metoprolol (LOPRESSOR) 50 MG tablet Take 1 tablet (50 mg total) by mouth 2 (two) times daily. 01/24/16   Jacquelin HawkingMcElroy, Shannon, PA-C  metroNIDAZOLE (FLAGYL) 500 MG tablet Take 1 tablet (500 mg total) by mouth 2 (two) times daily. 02/11/17   Rancour, Jeannett SeniorStephen, MD  oxyCODONE-acetaminophen (PERCOCET) 5-325 MG tablet Take 1 tablet by mouth every 4 (four) hours as needed. 10/06/16   Mancel BaleWentz, Elliott, MD  traZODone (DESYREL) 100 MG tablet Take 100 mg by mouth at bedtime.    [provider]  triamcinolone ointment (KENALOG) 0.1 % Apply 1 application topically 2 (two) times daily as needed. 08/07/16   Jacquelin HawkingMcElroy, Shannon, PA-C    Family History Family History  Problem Relation Age of Onset  . Seizures Mother   . Hypertension Mother   . Diabetes Mother   . Stroke Mother   . Heart disease Mother   . Hypertension Maternal Uncle   . Hypertension Maternal Uncle   . Hypertension Maternal Uncle   . Heart attack  Maternal Uncle   . Hypertension Maternal Uncle     Social History Social History   Tobacco Use  . Smoking status: Current Every Day Smoker    Packs/day: 0.10    Years: 13.00    Pack years: 1.30    Types: Cigarettes  . Smokeless tobacco: Never Used  Substance Use Topics  . Alcohol use: Yes    Comment: occ  . Drug use: No     Allergies   Hydrocodone; Lisinopril; Naproxen; Shellfish allergy; and Tramadol   Review of Systems Review of Systems  Constitutional: Negative for fever.  HENT: Negative for sore throat.   Respiratory: Negative for chest tightness and shortness of breath.   Cardiovascular: Negative for chest pain.  Gastrointestinal:  Negative for abdominal pain, nausea and vomiting.  Genitourinary: Negative for difficulty urinating, flank pain, genital sores, hematuria, vaginal bleeding and vaginal discharge.       Vaginal irritation  Musculoskeletal: Negative for back pain.  Skin: Negative for rash.  Neurological: Negative for weakness, numbness and headaches.     Physical Exam Updated Vital Signs BP (!) 159/101 (BP Location: Right Arm)   Pulse (!) 143   Temp 97.8 F (36.6 C) (Oral)   Resp 16   LMP 02/08/2017   SpO2 97%   Physical Exam  Constitutional: She is oriented to person, place, and time. She appears well-developed and well-nourished. No distress.  HENT:  Mouth/Throat: Oropharynx is clear and moist.  Neck: Normal range of motion. Neck supple.  Cardiovascular: Regular rhythm and intact distal pulses. Tachycardia present.  Pulmonary/Chest: Effort normal and breath sounds normal. No respiratory distress.  Abdominal: Soft. She exhibits no distension and no mass. There is no tenderness. There is no guarding.  Musculoskeletal: Normal range of motion.  Neurological: She is oriented to person, place, and time. No sensory deficit.  Skin: Skin is warm. Capillary refill takes less than 2 seconds. No rash noted.  Psychiatric:  Pt is anxious appearing  Nursing note and vitals reviewed.    ED Treatments / Results  Labs (all labs ordered are listed, but only abnormal results are displayed) Labs Reviewed - No data to display  EKG  EKG Interpretation None       Radiology No results found.  Procedures Procedures (including critical care time)  Medications Ordered in ED Medications  cefTRIAXone (ROCEPHIN) injection 250 mg (250 mg Intramuscular Given 02/25/17 1734)  azithromycin (ZITHROMAX) tablet 1,000 mg (1,000 mg Oral Given 02/25/17 1733)  ibuprofen (ADVIL,MOTRIN) tablet 800 mg (800 mg Oral Given 02/25/17 1733)  ondansetron (ZOFRAN-ODT) disintegrating tablet 8 mg (8 mg Oral Given 02/25/17 1732)    lidocaine (PF) (XYLOCAINE) 1 % injection (5 mLs  Given 02/25/17 1734)     Initial Impression / Assessment and Plan / ED Course  I have reviewed the triage vital signs and the nursing notes.  Pertinent labs & imaging results that were available during my care of the patient were reviewed by me and considered in my medical decision making (see chart for details).      Previous labs and medical record reviewed. +gonorrhea result.  Pelvic exam deferred, pt had exam 2 weeks ago without symptom change.  No concerning sx's for acute abdomen, TOA or PID. Advised pt that sexual partners will need testing as well.  Pt verbalized understanding and return precautions discussed.  Pt tachycardic on arrival, admits to anxiety and being upset regarding results and conflict with sexual partner.  She denies CP, SOB.  Pt observed for awhile,  drank fluids and tachycardia improving.  States that she is ready for d/c and continues to deny symptoms.  Tachycardia improving.  Appears stable for d/c  Final Clinical Impressions(s) / ED Diagnoses   Final diagnoses:  Gonorrhea in female    ED Discharge Orders    None       Pauline Ausriplett, Amelita Risinger, Cordelia Poche-C 02/26/17 1339    Donnetta Hutchingook, Brian, MD 02/28/17 1324

## 2017-02-25 NOTE — ED Notes (Signed)
Denies cp/sob/dizziness. PA aware.

## 2017-05-23 ENCOUNTER — Encounter (HOSPITAL_COMMUNITY): Payer: Self-pay | Admitting: *Deleted

## 2017-05-23 ENCOUNTER — Emergency Department (HOSPITAL_COMMUNITY)
Admission: EM | Admit: 2017-05-23 | Discharge: 2017-05-23 | Disposition: A | Discharge status: Home / Self Care | Payer: Self-pay | Attending: Emergency Medicine | Admitting: Emergency Medicine

## 2017-05-23 ENCOUNTER — Other Ambulatory Visit: Payer: Self-pay

## 2017-05-23 ENCOUNTER — Emergency Department (HOSPITAL_COMMUNITY): Payer: Self-pay

## 2017-05-23 DIAGNOSIS — J111 Influenza due to unidentified influenza virus with other respiratory manifestations: Secondary | ICD-10-CM | POA: Insufficient documentation

## 2017-05-23 DIAGNOSIS — Z79899 Other long term (current) drug therapy: Secondary | ICD-10-CM | POA: Insufficient documentation

## 2017-05-23 DIAGNOSIS — F17210 Nicotine dependence, cigarettes, uncomplicated: Secondary | ICD-10-CM | POA: Insufficient documentation

## 2017-05-23 DIAGNOSIS — I159 Secondary hypertension, unspecified: Secondary | ICD-10-CM | POA: Insufficient documentation

## 2017-05-23 MED ORDER — OSELTAMIVIR PHOSPHATE 75 MG PO CAPS
75.0000 mg | ORAL_CAPSULE | Freq: Once | ORAL | Status: AC
  Administered 2017-05-23: 75 mg via ORAL
  Filled 2017-05-23: qty 1

## 2017-05-23 MED ORDER — GUAIFENESIN 100 MG/5ML PO LIQD: 100.0000 mg | ORAL | 0 refills | Status: DC | PRN

## 2017-05-23 MED ORDER — OSELTAMIVIR PHOSPHATE 75 MG PO CAPS: 75.0000 mg | ORAL_CAPSULE | Freq: Two times a day (BID) | ORAL | 0 refills | Status: DC

## 2017-05-23 MED ORDER — AMLODIPINE BESYLATE 10 MG PO TABS: 10.0000 mg | ORAL_TABLET | Freq: Every day | ORAL | 1 refills | Status: DC

## 2017-05-23 MED ORDER — METOPROLOL TARTRATE 50 MG PO TABS: 50.0000 mg | ORAL_TABLET | Freq: Two times a day (BID) | ORAL | 1 refills | Status: DC

## 2017-05-23 NOTE — ED Notes (Signed)
Pt to xray at this time.

## 2017-05-23 NOTE — Discharge Instructions (Signed)
Make sure that you are getting plenty of rest, and drinking a lot of fluids.  Take Tylenol every 4 hours for fever or pain.  We are giving you refills on your blood pressure medicines, to give you time to get an appointment to see your doctor.

## 2017-05-23 NOTE — ED Triage Notes (Signed)
Fever, body aches, cough onset yesterday

## 2017-05-23 NOTE — ED Provider Notes (Signed)
[facility] EMERGENCY DEPARTMENT Provider Note   CSN: [id number] Arrival date & time: 05/23/17  4098     History   Chief Complaint Chief Complaint  Patient presents with  . Influenza    HPI Norma Nelson is a 32 y.o. female.  She presents for evaluation of rhinorrhea, cough, and fever which started 2 days ago.  Cough is productive of green to yellow sputum.  She has a history of bronchitis.  She felt worse today, while she was at her mother's funeral so decided to come here.  There is been no nausea, vomiting, chest or back pain.  There are no other known modifying factors.  HPI  Past Medical History:  Diagnosis Date  . Bronchitis   . Eczema   . Gonorrhea   . Hypertension   . Leukocytosis 2012   chronic  . Mental disorder    trouble sleeping,  . Papanicolaou smear of cervix with positive high risk human papilloma virus (HPV) test 09/09/2016  . Trichomonosis     Patient Active Problem List   Diagnosis Date Noted  . Papanicolaou smear of cervix with positive high risk human papilloma virus (HPV) test 09/09/2016  . Leukocytosis 10/19/2015  . Prediabetes 10/19/2015  . Essential hypertension, benign 09/14/2015  . Morbid obesity (HCC) 09/14/2015  . Cigarette nicotine dependence without complication 09/14/2015    History reviewed. No pertinent surgical history.  OB History    Gravida Para Term Preterm AB Living   0 0 0 0 0 0   SAB TAB Ectopic Multiple Live Births   0 0 0 0 0       Home Medications    Prior to Admission medications   Medication Sig Start Date End Date Taking? Authorizing Provider  amLODipine (NORVASC) 10 MG tablet Take 1 tablet (10 mg total) by mouth daily. 05/23/17   Mancel Bale, MD  AUVI-Q 0.3 MG/0.3ML SOAJ injection Inject 1 pen as directed daily as needed (allergic reaction).  08/15/16   [provider]  citalopram (CELEXA) 20 MG tablet Take 20 mg by mouth daily.    [provider]  diphenhydrAMINE (BENADRYL) 25 MG  tablet Take 50 mg by mouth every 6 (six) hours as needed for itching or allergies.    [provider]  guaiFENesin (ROBITUSSIN) 100 MG/5ML liquid Take 5-10 mLs (100-200 mg total) by mouth every 4 (four) hours as needed for cough or congestion. 05/23/17   Mancel Bale, MD  ibuprofen (ADVIL,MOTRIN) 800 MG tablet Take 1 tablet (800 mg total) by mouth 3 (three) times daily. 02/11/17   Rancour, Jeannett Senior, MD  metoprolol tartrate (LOPRESSOR) 50 MG tablet Take 1 tablet (50 mg total) by mouth 2 (two) times daily. 05/23/17   Mancel Bale, MD  metroNIDAZOLE (FLAGYL) 500 MG tablet Take 1 tablet (500 mg total) by mouth 2 (two) times daily. 02/11/17   Rancour, Jeannett Senior, MD  oseltamivir (TAMIFLU) 75 MG capsule Take 1 capsule (75 mg total) by mouth every 12 (twelve) hours. 05/23/17   Mancel Bale, MD  oxyCODONE-acetaminophen (PERCOCET) 5-325 MG tablet Take 1 tablet by mouth every 4 (four) hours as needed. 10/06/16   Mancel Bale, MD  traZODone (DESYREL) 100 MG tablet Take 100 mg by mouth at bedtime.    [provider]  triamcinolone ointment (KENALOG) 0.1 % Apply 1 application topically 2 (two) times daily as needed. 08/07/16   Jacquelin Hawking, PA-C    Family History Family History  Problem Relation Age of Onset  . Seizures Mother   .  Hypertension Mother   . Diabetes Mother   . Stroke Mother   . Heart disease Mother   . Hypertension Maternal Uncle   . Hypertension Maternal Uncle   . Hypertension Maternal Uncle   . Heart attack Maternal Uncle   . Hypertension Maternal Uncle     Social History Social History   Tobacco Use  . Smoking status: Current Every Day Smoker    Packs/day: 0.10    Years: 13.00    Pack years: 1.30    Types: Cigarettes  . Smokeless tobacco: Never Used  Substance Use Topics  . Alcohol use: Yes    Comment: occ  . Drug use: No     Allergies   Hydrocodone; Lisinopril; Naproxen; Shellfish allergy; and Tramadol   Review of Systems Review of Systems  All  other systems reviewed and are negative.    Physical Exam Updated Vital Signs BP 130/72 (BP Location: Left Arm)   Pulse 84   Temp (!) 100.4 F (38 C) (Oral)   Resp 19   Ht 5\' 11"  (1.803 m)   Wt 136.1 kg (300 lb)   LMP 04/22/2017   SpO2 94%   BMI 41.84 kg/m   Physical Exam  Constitutional: She is oriented to person, place, and time. She appears well-developed. No distress (Nontoxic).  Obese  HENT:  Head: Normocephalic and atraumatic.  Right Ear: External ear normal.  Left Ear: External ear normal.  Eyes: Conjunctivae and EOM are normal. Pupils are equal, round, and reactive to light.  Neck: Normal range of motion and phonation normal. Neck supple.  Cardiovascular: Normal rate, regular rhythm and normal heart sounds.  Pulmonary/Chest: Effort normal. No respiratory distress. She exhibits no bony tenderness.  Decreased air movement bilaterally.  Abdominal: Soft. There is no tenderness.  Musculoskeletal: Normal range of motion.  Neurological: She is alert and oriented to person, place, and time. No cranial nerve deficit or sensory deficit. She exhibits normal muscle tone. Coordination normal.  Skin: Skin is warm, dry and intact.  Psychiatric: She has a normal mood and affect. Her behavior is normal. Judgment and thought content normal.  Nursing note and vitals reviewed.    ED Treatments / Results  Labs (all labs ordered are listed, but only abnormal results are displayed) Labs Reviewed - No data to display  EKG  EKG Interpretation None       Radiology Dg Chest 2 View  Result Date: 05/23/2017 CLINICAL DATA:  Fever, body aches, shortness of breath EXAM: CHEST  2 VIEW COMPARISON:  10/06/2016 FINDINGS: There is bilateral diffuse mild interstitial prominence which may reflect mild interstitial edema versus interstitial infection. There is no focal parenchymal opacity. There is no pleural effusion or pneumothorax. The heart and mediastinal contours are unremarkable. The  osseous structures are unremarkable. IMPRESSION: Bilateral diffuse mild interstitial prominence which may reflect mild interstitial edema versus interstitial infection. Electronically Signed   By: Elige Ko   On: 05/23/2017 19:32    Procedures Procedures (including critical care time)  Medications Ordered in ED Medications  oseltamivir (TAMIFLU) capsule 75 mg (75 mg Oral Given 05/23/17 1931)     Initial Impression / Assessment and Plan / ED Course  I have reviewed the triage vital signs and the nursing notes.  Pertinent labs & imaging results that were available during my care of the patient were reviewed by me and considered in my medical decision making (see chart for details).      Patient Vitals for the past 24 hrs:  BP Temp Temp src Pulse Resp SpO2 Height Weight  05/23/17 2028 130/72 (!) 100.4 F (38 C) Oral 84 19 94 % - -  05/23/17 1842 - - - - - - 5\' 11"  (1.803 m) 136.1 kg (300 lb)  05/23/17 1841 (!) 153/79 100.1 F (37.8 C) Oral 83 14 96 % - -    At discharge reevaluation with update and discussion. After initial assessment and treatment, an updated evaluation reveals patient is comfortable has no further complaints.  She requested refills on her blood pressure medication.  She also reported she has been taking Robitussin-DM without relief.  Findings discussed and questions answered. Mancel BaleElliott Nathian Stencil     Final Clinical Impressions(s) / ED Diagnoses   Final diagnoses:  Influenza  Secondary hypertension   The evaluation is consistent with uncomplicated influenza.  She has been started on Tamiflu, with risk factor of obesity.  Incidental hypertension, controlled here but needing refills on her prescription.  Chest x-ray shows mild nonspecific interstitial abnormality, possibly related to high blood pressure versus acute viral illness.  Doubt sepsis or pneumonia.  Nursing Notes Reviewed/ Care Coordinated Applicable Imaging Reviewed Interpretation of Laboratory Data  incorporated into ED treatment  The patient appears reasonably screened and/or stabilized for discharge and I doubt any other medical condition or other [facility] requiring further screening, evaluation, or treatment in the ED at this time prior to discharge.  Plan: Home Medications-continue usual medicines and use OTC analgesia/antipyretic as needed; Home Treatments-rest, fluids, gradually advance diet; return here if the recommended treatment, does not improve the symptoms; Recommended follow up-PCP, as needed   ED Discharge Orders        Ordered    amLODipine (NORVASC) 10 MG tablet  Daily     05/23/17 2038    metoprolol tartrate (LOPRESSOR) 50 MG tablet  2 times daily     05/23/17 2038    oseltamivir (TAMIFLU) 75 MG capsule  Every 12 hours     05/23/17 2038    guaiFENesin (ROBITUSSIN) 100 MG/5ML liquid  Every 4 hours PRN     05/23/17 2038       Mancel BaleWentz, Laiken Nohr, MD 05/23/17 2340

## 2017-06-17 ENCOUNTER — Encounter (HOSPITAL_COMMUNITY): Payer: Self-pay | Admitting: *Deleted

## 2017-06-17 ENCOUNTER — Other Ambulatory Visit: Payer: Self-pay

## 2017-06-17 ENCOUNTER — Emergency Department (HOSPITAL_COMMUNITY)
Admission: EM | Admit: 2017-06-17 | Discharge: 2017-06-17 | Disposition: A | Discharge status: Home / Self Care | Payer: Medicaid Other | Attending: Emergency Medicine | Admitting: Emergency Medicine

## 2017-06-17 DIAGNOSIS — Z91013 Allergy to seafood: Secondary | ICD-10-CM | POA: Insufficient documentation

## 2017-06-17 DIAGNOSIS — F17210 Nicotine dependence, cigarettes, uncomplicated: Secondary | ICD-10-CM | POA: Insufficient documentation

## 2017-06-17 DIAGNOSIS — I10 Essential (primary) hypertension: Secondary | ICD-10-CM | POA: Insufficient documentation

## 2017-06-17 DIAGNOSIS — T7840XA Allergy, unspecified, initial encounter: Secondary | ICD-10-CM | POA: Insufficient documentation

## 2017-06-17 DIAGNOSIS — Z79899 Other long term (current) drug therapy: Secondary | ICD-10-CM | POA: Insufficient documentation

## 2017-06-17 DIAGNOSIS — H5789 Other specified disorders of eye and adnexa: Secondary | ICD-10-CM | POA: Insufficient documentation

## 2017-06-17 MED ORDER — METHYLPREDNISOLONE SODIUM SUCC 125 MG IJ SOLR
125.0000 mg | Freq: Once | INTRAMUSCULAR | Status: AC
  Administered 2017-06-17: 125 mg via INTRAMUSCULAR
  Filled 2017-06-17: qty 2

## 2017-06-17 MED ORDER — PREDNISONE 10 MG PO TABS: 20.0000 mg | ORAL_TABLET | Freq: Two times a day (BID) | ORAL | 0 refills | Status: DC

## 2017-06-17 NOTE — ED Provider Notes (Signed)
[email or web address] Provider Note   CSN: [id number] Arrival date & time: 06/17/17  0224     History   Chief Complaint Chief Complaint  Patient presents with  . Allergic Reaction    HPI Norma Nelson is a 32 y.o. female.  Patient is a 32 year old female with past medical history of hypertension, obesity, and shellfish allergy.  She presents for evaluation of throat irritation, eyes itching, and facial itching.  This started after eating a seafood bowl that contained shrimp.  This was on known to her at the time.  She took Benadryl at home along with giving herself an epi shot.   The history is provided by the patient.  Allergic Reaction  Presenting symptoms: itching and swelling   Presenting symptoms: no difficulty breathing and no difficulty swallowing   Severity:  Moderate Duration:  3 hours Relieved by:  Antihistamines (EpiPen) Worsened by:  Nothing   Past Medical History:  Diagnosis Date  . Bronchitis   . Eczema   . Gonorrhea   . Hypertension   . Leukocytosis 2012   chronic  . Mental disorder    trouble sleeping,  . Papanicolaou smear of cervix with positive high risk human papilloma virus (HPV) test 09/09/2016  . Trichomonosis     Patient Active Problem List   Diagnosis Date Noted  . Papanicolaou smear of cervix with positive high risk human papilloma virus (HPV) test 09/09/2016  . Leukocytosis 10/19/2015  . Prediabetes 10/19/2015  . Essential hypertension, benign 09/14/2015  . Morbid obesity (HCC) 09/14/2015  . Cigarette nicotine dependence without complication 09/14/2015    History reviewed. No pertinent surgical history.  OB History    Gravida Para Term Preterm AB Living   0 0 0 0 0 0   SAB TAB Ectopic Multiple Live Births   0 0 0 0 0       Home Medications    Prior to Admission medications   Medication Sig Start Date End Date Taking? Authorizing Provider  amLODipine (NORVASC) 10 MG tablet Take 1 tablet (10 mg total)  by mouth daily. 05/23/17   Mancel BaleWentz, Elliott, MD  AUVI-Q 0.3 MG/0.3ML SOAJ injection Inject 1 pen as directed daily as needed (allergic reaction).  08/15/16   [provider]  citalopram (CELEXA) 20 MG tablet Take 20 mg by mouth daily.    [provider]  diphenhydrAMINE (BENADRYL) 25 MG tablet Take 50 mg by mouth every 6 (six) hours as needed for itching or allergies.    [provider]  guaiFENesin (ROBITUSSIN) 100 MG/5ML liquid Take 5-10 mLs (100-200 mg total) by mouth every 4 (four) hours as needed for cough or congestion. 05/23/17   Mancel BaleWentz, Elliott, MD  ibuprofen (ADVIL,MOTRIN) 800 MG tablet Take 1 tablet (800 mg total) by mouth 3 (three) times daily. 02/11/17   Rancour, Jeannett SeniorStephen, MD  metoprolol tartrate (LOPRESSOR) 50 MG tablet Take 1 tablet (50 mg total) by mouth 2 (two) times daily. 05/23/17   Mancel BaleWentz, Elliott, MD  metroNIDAZOLE (FLAGYL) 500 MG tablet Take 1 tablet (500 mg total) by mouth 2 (two) times daily. 02/11/17   Rancour, Jeannett SeniorStephen, MD  oseltamivir (TAMIFLU) 75 MG capsule Take 1 capsule (75 mg total) by mouth every 12 (twelve) hours. 05/23/17   Mancel BaleWentz, Elliott, MD  oxyCODONE-acetaminophen (PERCOCET) 5-325 MG tablet Take 1 tablet by mouth every 4 (four) hours as needed. 10/06/16   Mancel BaleWentz, Elliott, MD  traZODone (DESYREL) 100 MG tablet Take 100 mg by mouth at bedtime.  [provider]  triamcinolone ointment (KENALOG) 0.1 % Apply 1 application topically 2 (two) times daily as needed. 08/07/16   Jacquelin Hawking, PA-C    Family History Family History  Problem Relation Age of Onset  . Seizures Mother   . Hypertension Mother   . Diabetes Mother   . Stroke Mother   . Heart disease Mother   . Hypertension Maternal Uncle   . Hypertension Maternal Uncle   . Hypertension Maternal Uncle   . Heart attack Maternal Uncle   . Hypertension Maternal Uncle     Social History Social History   Tobacco Use  . Smoking status: Current Every Day Smoker    Packs/day: 0.10     Years: 13.00    Pack years: 1.30    Types: Cigarettes  . Smokeless tobacco: Never Used  Substance Use Topics  . Alcohol use: Yes    Comment: occ  . Drug use: No     Allergies   Hydrocodone; Lisinopril; Naproxen; Shellfish allergy; and Tramadol   Review of Systems Review of Systems  HENT: Negative for trouble swallowing.   Skin: Positive for itching.  All other systems reviewed and are negative.    Physical Exam Updated Vital Signs BP (!) 155/81 (BP Location: Left Arm)   Pulse 78   Temp 98.4 F (36.9 C) (Oral)   Resp 18   Ht 5\' 11"  (1.803 m)   Wt 136.1 kg (300 lb)   LMP 06/03/2017   SpO2 99%   BMI 41.84 kg/m   Physical Exam  Constitutional: She is oriented to person, place, and time. She appears well-developed and well-nourished. No distress.  HENT:  Head: Normocephalic and atraumatic.  Mouth/Throat: Oropharynx is clear and moist.  There is no oral swelling noted.  Eyes:  There is mild conjunctival injection bilaterally.  Neck: Normal range of motion. Neck supple.  Cardiovascular: Normal rate and regular rhythm. Exam reveals no gallop and no friction rub.  No murmur heard. Pulmonary/Chest: Effort normal and breath sounds normal. No stridor. No respiratory distress. She has no wheezes.  Abdominal: Soft. Bowel sounds are normal. She exhibits no distension. There is no tenderness.  Musculoskeletal: Normal range of motion.  Neurological: She is alert and oriented to person, place, and time.  Skin: Skin is warm and dry. She is not diaphoretic.  Nursing note and vitals reviewed.    ED Treatments / Results  Labs (all labs ordered are listed, but only abnormal results are displayed) Labs Reviewed - No data to display  EKG  EKG Interpretation None       Radiology No results found.  Procedures Procedures (including critical care time)  Medications Ordered in ED Medications  methylPREDNISolone sodium succinate (SOLU-MEDROL) 125 mg/2 mL injection 125 mg  (not administered)     Initial Impression / Assessment and Plan / ED Course  I have reviewed the triage vital signs and the nursing notes.  Pertinent labs & imaging results that were available during my care of the patient were reviewed by me and considered in my medical decision making (see chart for details).  Patient presents with an allergic reaction to shrimp she inadvertently ingested.  She had taken Benadryl and an EpiPen prior to coming here.  She displays no stridor, no wheezing, stable vital signs, no significant oral swelling, and nothing that would indicate an anaphylactic reaction.  She was given Solu-Medrol and observed and appears to be doing well.  At this point, I see no indication for admission or  further workup.  She will be discharged with Benadryl and prednisone and as needed return.  Final Clinical Impressions(s) / ED Diagnoses   Final diagnoses:  None    ED Discharge Orders    None       Geoffery Lyons, MD 06/17/17 [phone]

## 2017-06-17 NOTE — Discharge Instructions (Signed)
Prednisone as prescribed.  Benadryl 25 mg every 6 hours for the next 2-3 days.  Return to the emergency department if you develop difficulty breathing, throat swelling, or other new and concerning symptoms.

## 2017-06-17 NOTE — ED Triage Notes (Signed)
Pt states she ate food that contained shrimp around 11:30 tonight; pt states her eyes began to burn and swell and her throat feels itchy; pt states she has had benadryl 50mg  PO and used her epinephrine pen with no relief

## 2017-10-27 ENCOUNTER — Ambulatory Visit: Payer: Medicaid Other | Admitting: Physician Assistant

## 2017-10-27 ENCOUNTER — Encounter: Payer: Self-pay | Admitting: Physician Assistant

## 2017-10-27 VITALS — BP 154/88 | HR 66 | Temp 97.9°F | Ht 69.0 in | Wt 288.5 lb

## 2017-10-27 DIAGNOSIS — R7303 Prediabetes: Secondary | ICD-10-CM

## 2017-10-27 DIAGNOSIS — Z13220 Encounter for screening for lipoid disorders: Secondary | ICD-10-CM

## 2017-10-27 DIAGNOSIS — D72829 Elevated white blood cell count, unspecified: Secondary | ICD-10-CM

## 2017-10-27 DIAGNOSIS — L981 Factitial dermatitis: Secondary | ICD-10-CM

## 2017-10-27 DIAGNOSIS — F424 Excoriation (skin-picking) disorder: Secondary | ICD-10-CM

## 2017-10-27 DIAGNOSIS — Z7689 Persons encountering health services in other specified circumstances: Secondary | ICD-10-CM

## 2017-10-27 DIAGNOSIS — L309 Dermatitis, unspecified: Secondary | ICD-10-CM

## 2017-10-27 DIAGNOSIS — I10 Essential (primary) hypertension: Secondary | ICD-10-CM

## 2017-10-27 DIAGNOSIS — F39 Unspecified mood [affective] disorder: Secondary | ICD-10-CM

## 2017-10-27 DIAGNOSIS — F17210 Nicotine dependence, cigarettes, uncomplicated: Secondary | ICD-10-CM

## 2017-10-27 MED ORDER — AMLODIPINE BESYLATE 10 MG PO TABS: 10.0000 mg | ORAL_TABLET | Freq: Every day | ORAL | 1 refills | Status: DC

## 2017-10-27 NOTE — Progress Notes (Signed)
BP (!) 154/88 (BP Location: Left Arm, Patient Position: Sitting, Cuff Size: Large)   Pulse 66   Temp 97.9 F (36.6 C) (Other (Comment))   Ht 5\' 9"  (1.753 m)   Wt 288 lb 8 oz (130.9 kg)   LMP 10/26/2017 (Exact Date)   SpO2 98%   BMI 42.60 kg/m    Subjective:    Patient ID: Norma Nelson, female    DOB: 06-08-1985, 32 y.o.   MRN: [id number]  HPI: Norma CabalCiera D Grigorian is a 32 y.o. female presenting on 10/27/2017 for New Patient (Initial Visit) (New/Old Pt)   HPI   Chief Complaint  Patient presents with  . New Patient (Initial Visit)    New/Old Pt     Pt was seen here previously but was dismissed for repeated no-shows.  She was last seen here 09/05/2016.     Pt is no longer going to [facility].  Her mother and her aunt have passed away recently.  Pt admits to a lot of stress, anxiety and depression.   Pt says she went to Dermatology last year for her eczema.  She says it is really bad now due to the summertime.  She admits to picking at it a lot, she says less because it itches and more due to anxiety.   She says topical steroids do not help it any more.   Relevant past medical, surgical, family and social history reviewed and updated as indicated. Interim medical history since our last visit reviewed. Allergies and medications reviewed and updated.  No current outpatient medications on file.  Review of Systems  Constitutional: Negative for appetite change, chills, diaphoresis, fatigue, fever and unexpected weight change.  HENT: Negative for congestion, dental problem, drooling, ear pain, facial swelling, hearing loss, mouth sores, sneezing, sore throat, trouble swallowing and voice change.   Eyes: Negative for pain, discharge, redness, itching and visual disturbance.  Respiratory: Negative for cough, choking, shortness of breath and wheezing.   Cardiovascular: Negative for chest pain, palpitations and leg swelling.  Gastrointestinal: Negative for abdominal pain, blood in stool,  constipation, diarrhea and vomiting.  Endocrine: Negative for cold intolerance, heat intolerance and polydipsia.  Genitourinary: Negative for decreased urine volume, dysuria and hematuria.  Musculoskeletal: Negative for arthralgias, back pain and gait problem.  Skin: Negative for rash.  Allergic/Immunologic: Negative for environmental allergies.  Neurological: Negative for seizures, syncope, light-headedness and headaches.  Hematological: Negative for adenopathy.  Psychiatric/Behavioral: Negative for agitation, dysphoric mood and suicidal ideas. The patient is not nervous/anxious.     Per HPI unless specifically indicated above     Objective:    BP (!) 154/88 (BP Location: Left Arm, Patient Position: Sitting, Cuff Size: Large)   Pulse 66   Temp 97.9 F (36.6 C) (Other (Comment))   Ht 5\' 9"  (1.753 m)   Wt 288 lb 8 oz (130.9 kg)   LMP 10/26/2017 (Exact Date)   SpO2 98%   BMI 42.60 kg/m   Wt Readings from Last 3 Encounters:  10/27/17 288 lb 8 oz (130.9 kg)  06/17/17 300 lb (136.1 kg)  05/23/17 300 lb (136.1 kg)    Physical Exam  Constitutional: She is oriented to person, place, and time. She appears well-developed and well-nourished.  HENT:  Head: Normocephalic and atraumatic.  Mouth/Throat: Oropharynx is clear and moist. No oropharyngeal exudate.  Eyes: Pupils are equal, round, and reactive to light. Conjunctivae and EOM are normal.  Neck: Neck supple. No thyromegaly present.  Cardiovascular: Normal rate and regular rhythm.  Pulmonary/Chest: Effort normal and breath sounds normal.  Abdominal: Soft. Bowel sounds are normal. She exhibits no mass. There is no hepatosplenomegaly. There is no tenderness.  Musculoskeletal: She exhibits no edema.  Lymphadenopathy:    She has no cervical adenopathy.  Neurological: She is alert and oriented to person, place, and time. Gait normal.  Skin: Skin is warm and dry.  Thickened patches, mostly on BUE, more from elbow distally.  Many areas  with excoriation and weeping serous fluid.  No areas of secondary infection seen.   Psychiatric: She has a normal mood and affect. Her behavior is normal.  Vitals reviewed.       Assessment & Plan:   Encounter Diagnoses  Name Primary?  . Encounter to establish care Yes  . Essential hypertension, benign   . Prediabetes   . Morbid obesity (HCC)   . Leukocytosis, unspecified type   . Screening cholesterol level   . Eczema, unspecified type   . Cigarette nicotine dependence without complication   . Morbid obesity, unspecified obesity type (HCC)   . Mood disorder (HCC)   . Picking own skin      -Restart amlodipine for bp -urged pt to Return to [facility] for MH issues -pt to Get fasting labs -Re-refer to dermatology.  Urged pt to avoid picking.  Discussed she could use OTC aquaphor if needed to help soften the areas and reduce picking -pt to follow up 1 month to recheck bp and review labs.  RTO sooner prn

## 2017-10-30 ENCOUNTER — Other Ambulatory Visit: Payer: Self-pay | Admitting: Physician Assistant

## 2017-11-07 ENCOUNTER — Other Ambulatory Visit (HOSPITAL_COMMUNITY)
Admission: RE | Admit: 2017-11-07 | Discharge: 2017-11-07 | Disposition: A | Discharge status: Home / Self Care | Payer: Medicaid Other | Source: Ambulatory Visit | Attending: Physician Assistant | Admitting: Physician Assistant

## 2017-11-07 DIAGNOSIS — Z13220 Encounter for screening for lipoid disorders: Secondary | ICD-10-CM | POA: Insufficient documentation

## 2017-11-07 DIAGNOSIS — I10 Essential (primary) hypertension: Secondary | ICD-10-CM | POA: Insufficient documentation

## 2017-11-07 DIAGNOSIS — D72829 Elevated white blood cell count, unspecified: Secondary | ICD-10-CM | POA: Insufficient documentation

## 2017-11-07 DIAGNOSIS — R7303 Prediabetes: Secondary | ICD-10-CM | POA: Insufficient documentation

## 2017-11-07 LAB — CBC WITH DIFFERENTIAL/PLATELET
Basophils Absolute: 0.1 10*3/uL (ref 0.0–0.1)
Basophils Relative: 1 %
Eosinophils Absolute: 0.6 10*3/uL (ref 0.0–0.7)
Eosinophils Relative: 3 %
HCT: 40.3 % (ref 36.0–46.0)
Hemoglobin: 13.2 g/dL (ref 12.0–15.0)
Lymphocytes Relative: 20 %
Lymphs Abs: 3.6 10*3/uL (ref 0.7–4.0)
MCH: 27.2 pg (ref 26.0–34.0)
MCHC: 32.8 g/dL (ref 30.0–36.0)
MCV: 83.1 fL (ref 78.0–100.0)
Monocytes Absolute: 2.2 10*3/uL — ABNORMAL HIGH (ref 0.1–1.0)
Monocytes Relative: 12 %
Neutro Abs: 11.4 10*3/uL — ABNORMAL HIGH (ref 1.7–7.7)
Neutrophils Relative %: 64 %
Platelets: 461 10*3/uL — ABNORMAL HIGH (ref 150–400)
RBC: 4.85 MIL/uL (ref 3.87–5.11)
RDW: 14.4 % (ref 11.5–15.5)
WBC: 17.8 10*3/uL — ABNORMAL HIGH (ref 4.0–10.5)

## 2017-11-07 LAB — COMPREHENSIVE METABOLIC PANEL
ALT: 26 U/L (ref 0–44)
AST: 20 U/L (ref 15–41)
Albumin: 3.6 g/dL (ref 3.5–5.0)
Alkaline Phosphatase: 107 U/L (ref 38–126)
Anion gap: 8 (ref 5–15)
BUN: 9 mg/dL (ref 6–20)
CO2: 27 mmol/L (ref 22–32)
Calcium: 9.6 mg/dL (ref 8.9–10.3)
Chloride: 105 mmol/L (ref 98–111)
Creatinine, Ser: 0.82 mg/dL (ref 0.44–1.00)
GFR calc Af Amer: >60 mL/min (ref >60)
GFR calc non Af Amer: >60 mL/min (ref >60)
Glucose, Bld: 107 mg/dL — ABNORMAL HIGH (ref 70–99)
Potassium: 3.3 mmol/L — ABNORMAL LOW (ref 3.5–5.1)
Sodium: 140 mmol/L (ref 135–145)
Total Bilirubin: 0.4 mg/dL (ref 0.3–1.2)
Total Protein: 7.5 g/dL (ref 6.5–8.1)

## 2017-11-07 LAB — LIPID PANEL
Cholesterol: 146 mg/dL (ref 0–200)
HDL: 38 mg/dL — ABNORMAL LOW (ref >40)
LDL Cholesterol: 90 mg/dL (ref 0–99)
Total CHOL/HDL Ratio: 3.8 RATIO
Triglycerides: 89 mg/dL (ref <150)
VLDL: 18 mg/dL (ref 0–40)

## 2017-11-07 LAB — HEMOGLOBIN A1C
Hgb A1c MFr Bld: 5.9 % — ABNORMAL HIGH (ref 4.8–5.6)
Mean Plasma Glucose: 122.63 mg/dL

## 2017-11-25 ENCOUNTER — Encounter: Payer: Self-pay | Admitting: Physician Assistant

## 2017-11-25 ENCOUNTER — Ambulatory Visit: Payer: Medicaid Other | Admitting: Physician Assistant

## 2017-11-25 VITALS — BP 150/90 | HR 78 | Temp 98.6°F | Ht 69.0 in | Wt 285.5 lb

## 2017-11-25 DIAGNOSIS — L309 Dermatitis, unspecified: Secondary | ICD-10-CM

## 2017-11-25 DIAGNOSIS — I10 Essential (primary) hypertension: Secondary | ICD-10-CM

## 2017-11-25 DIAGNOSIS — E876 Hypokalemia: Secondary | ICD-10-CM

## 2017-11-25 DIAGNOSIS — D72829 Elevated white blood cell count, unspecified: Secondary | ICD-10-CM

## 2017-11-25 DIAGNOSIS — F17210 Nicotine dependence, cigarettes, uncomplicated: Secondary | ICD-10-CM

## 2017-11-25 MED ORDER — AMLODIPINE BESYLATE 10 MG PO TABS: 10.0000 mg | ORAL_TABLET | Freq: Every day | ORAL | 1 refills | Status: DC

## 2017-11-25 NOTE — Progress Notes (Signed)
BP (!) 150/90 (BP Location: Left Arm, Patient Position: Sitting, Cuff Size: Normal)   Pulse 78   Temp 98.6 F (37 C)   Ht 5\' 9"  (1.753 m)   Wt 285 lb 8 oz (129.5 kg)   LMP 10/26/2017 (Exact Date)   SpO2 99%   BMI 42.16 kg/m    Subjective:    Patient ID: Norma Nelson, female    DOB: 1985-06-02, 32 y.o.   MRN: [id number]  HPI: Norma Nelson is a 32 y.o. female presenting on 11/25/2017 for Hypertension   HPI   Pt did not get her blood pressure medication as prescribed at last OV.   Pt went to Dermatologist and got rx for TAC and some other medication she can't remember the name  Pt got a job with [employer] and starts orientation tomorrow.  She says she will be able to get on insurance in 3 months.   Relevant past medical, surgical, family and social history reviewed and updated as indicated. Interim medical history since our last visit reviewed. Allergies and medications reviewed and updated.  CURRENT MEDS: none  Review of Systems  Constitutional: Negative for appetite change, chills, diaphoresis, fatigue, fever and unexpected weight change.  HENT: Negative for congestion, dental problem, drooling, ear pain, facial swelling, hearing loss, mouth sores, sneezing, sore throat, trouble swallowing and voice change.   Eyes: Negative for pain, discharge, redness, itching and visual disturbance.  Respiratory: Negative for cough, choking, shortness of breath and wheezing.   Cardiovascular: Negative for chest pain, palpitations and leg swelling.  Gastrointestinal: Negative for abdominal pain, blood in stool, constipation, diarrhea and vomiting.  Endocrine: Negative for cold intolerance, heat intolerance and polydipsia.  Genitourinary: Negative for decreased urine volume, dysuria and hematuria.  Musculoskeletal: Negative for arthralgias, back pain and gait problem.  Skin: Negative for rash.  Allergic/Immunologic: Negative for environmental allergies.  Neurological: Negative for  seizures, syncope, light-headedness and headaches.  Hematological: Negative for adenopathy.  Psychiatric/Behavioral: Negative for agitation, dysphoric mood and suicidal ideas. The patient is not nervous/anxious.     Per HPI unless specifically indicated above     Objective:    BP (!) 150/90 (BP Location: Left Arm, Patient Position: Sitting, Cuff Size: Normal)   Pulse 78   Temp 98.6 F (37 C)   Ht 5\' 9"  (1.753 m)   Wt 285 lb 8 oz (129.5 kg)   LMP 10/26/2017 (Exact Date)   SpO2 99%   BMI 42.16 kg/m   Wt Readings from Last 3 Encounters:  11/25/17 285 lb 8 oz (129.5 kg)  10/27/17 288 lb 8 oz (130.9 kg)  06/17/17 300 lb (136.1 kg)    Physical Exam  Constitutional: She is oriented to person, place, and time. She appears well-developed and well-nourished.  HENT:  Head: Normocephalic and atraumatic.  Neck: Neck supple.  Cardiovascular: Normal rate and regular rhythm.  Pulmonary/Chest: Effort normal and breath sounds normal.  Abdominal: Soft. Bowel sounds are normal. She exhibits no mass. There is no hepatosplenomegaly. There is no tenderness.  Musculoskeletal: She exhibits no edema.  Lymphadenopathy:    She has no cervical adenopathy.  Neurological: She is alert and oriented to person, place, and time.  Skin: Skin is warm and dry.  Psychiatric: She has a normal mood and affect. Her behavior is normal.  Vitals reviewed.   Results for orders placed or performed during the hospital encounter of 11/07/17  Comprehensive metabolic panel  Result Value Ref Range   Sodium 140 135 - 145  mmol/L   Potassium 3.3 (L) 3.5 - 5.1 mmol/L   Chloride 105 98 - 111 mmol/L   CO2 27 22 - 32 mmol/L   Glucose, Bld 107 (H) 70 - 99 mg/dL   BUN 9 6 - 20 mg/dL   Creatinine, Ser 1.61 0.44 - 1.00 mg/dL   Calcium 9.6 8.9 - 09.6 mg/dL   Total Protein 7.5 6.5 - 8.1 g/dL   Albumin 3.6 3.5 - 5.0 g/dL   AST 20 15 - 41 U/L   ALT 26 0 - 44 U/L   Alkaline Phosphatase 107 38 - 126 U/L   Total Bilirubin 0.4  0.3 - 1.2 mg/dL   GFR calc non Af Amer >60 >60 mL/min   GFR calc Af Amer >60 >60 mL/min   Anion gap 8 5 - 15  Lipid panel  Result Value Ref Range   Cholesterol 146 0 - 200 mg/dL   Triglycerides 89 <045 mg/dL   HDL 38 (L) >40 mg/dL   Total CHOL/HDL Ratio 3.8 RATIO   VLDL 18 0 - 40 mg/dL   LDL Cholesterol 90 0 - 99 mg/dL  CBC w/Diff/Platelet  Result Value Ref Range   WBC 17.8 (H) 4.0 - 10.5 K/uL   RBC 4.85 3.87 - 5.11 MIL/uL   Hemoglobin 13.2 12.0 - 15.0 g/dL   HCT 98.1 19.1 - 47.8 %   MCV 83.1 78.0 - 100.0 fL   MCH 27.2 26.0 - 34.0 pg   MCHC 32.8 30.0 - 36.0 g/dL   RDW 29.5 62.1 - 30.8 %   Platelets 461 (H) 150 - 400 K/uL   Neutrophils Relative % 64 %   Neutro Abs 11.4 (H) 1.7 - 7.7 K/uL   Lymphocytes Relative 20 %   Lymphs Abs 3.6 0.7 - 4.0 K/uL   Monocytes Relative 12 %   Monocytes Absolute 2.2 (H) 0.1 - 1.0 K/uL   Eosinophils Relative 3 %   Eosinophils Absolute 0.6 0.0 - 0.7 K/uL   Basophils Relative 1 %   Basophils Absolute 0.1 0.0 - 0.1 K/uL  Hemoglobin A1c  Result Value Ref Range   Hgb A1c MFr Bld 5.9 (H) 4.8 - 5.6 %   Mean Plasma Glucose 122.63 mg/dL      Assessment & Plan:    Encounter Diagnoses  Name Primary?  . Essential hypertension, benign Yes  . Hypokalemia   . Morbid obesity (HCC)   . Leukocytosis, unspecified type   . Cigarette nicotine dependence without complication   . Eczema, unspecified type     -reviewed labs with pt -will Recheck k+ today -pt counseled to Get on her amlodipine -discussed with pt seeing a hematologist for elevated white count.  She says she never has in the past.  She will wait until she gets on insurance and discuss with her new PCP -pt to Follow up 1 month to recheck the blood pressure

## 2017-11-26 ENCOUNTER — Ambulatory Visit: Payer: Medicaid Other | Admitting: Physician Assistant

## 2017-11-27 ENCOUNTER — Ambulatory Visit: Payer: Medicaid Other | Admitting: Physician Assistant

## 2017-12-25 ENCOUNTER — Ambulatory Visit: Payer: Medicaid Other | Admitting: Physician Assistant

## 2018-01-01 ENCOUNTER — Other Ambulatory Visit (HOSPITAL_COMMUNITY)
Admission: RE | Admit: 2018-01-01 | Discharge: 2018-01-01 | Disposition: A | Discharge status: Home / Self Care | Payer: Medicaid Other | Source: Ambulatory Visit | Attending: Physician Assistant | Admitting: Physician Assistant

## 2018-01-01 ENCOUNTER — Encounter: Payer: Self-pay | Admitting: Physician Assistant

## 2018-01-01 ENCOUNTER — Ambulatory Visit: Payer: Medicaid Other | Admitting: Physician Assistant

## 2018-01-01 VITALS — BP 148/97 | HR 68 | Temp 98.2°F | Ht 69.0 in | Wt 288.8 lb

## 2018-01-01 DIAGNOSIS — E876 Hypokalemia: Secondary | ICD-10-CM | POA: Insufficient documentation

## 2018-01-01 DIAGNOSIS — I10 Essential (primary) hypertension: Secondary | ICD-10-CM

## 2018-01-01 LAB — POTASSIUM: Potassium: 3.8 mmol/L (ref 3.5–5.1)

## 2018-01-01 MED ORDER — METOPROLOL TARTRATE 50 MG PO TABS: 50.0000 mg | ORAL_TABLET | Freq: Two times a day (BID) | ORAL | 1 refills | Status: DC

## 2018-01-01 MED ORDER — AMLODIPINE BESYLATE 10 MG PO TABS: 10.0000 mg | ORAL_TABLET | Freq: Every day | ORAL | 1 refills | Status: DC

## 2018-01-01 NOTE — Progress Notes (Signed)
BP (!) 148/97 (BP Location: Right Arm, Patient Position: Sitting, Cuff Size: Large)   Pulse 68   Temp 98.2 F (36.8 C)   Ht 5\' 9"  (1.753 m)   Wt 288 lb 12 oz (131 kg)   SpO2 98%   BMI 42.64 kg/m    Subjective:    Patient ID: Norma Nelson, female    DOB: 1985/10/26, 32 y.o.   MRN: [id number]  HPI: Norma Nelson is a 32 y.o. female presenting on 01/01/2018 for Hypertension   HPI   Pt says she started the amlodipine 2 or 3 weeks ago.    She didn't get the job at [employer] (so she won't be getting insurance)  Relevant past medical, surgical, family and social history reviewed and updated as indicated. Interim medical history since our last visit reviewed. Allergies and medications reviewed and updated.   Current Outpatient Medications:  .  amLODipine (NORVASC) 10 MG tablet, Take 1 tablet (10 mg total) by mouth daily., Disp: 30 tablet, Rfl: 1   Review of Systems  Constitutional: Negative for appetite change, chills, diaphoresis, fatigue, fever and unexpected weight change.  HENT: Negative for congestion, dental problem, drooling, ear pain, facial swelling, hearing loss, mouth sores, sneezing, sore throat, trouble swallowing and voice change.   Eyes: Negative for pain, discharge, redness, itching and visual disturbance.  Respiratory: Negative for cough, choking, shortness of breath and wheezing.   Cardiovascular: Negative for chest pain, palpitations and leg swelling.  Gastrointestinal: Negative for abdominal pain, blood in stool, constipation, diarrhea and vomiting.  Endocrine: Negative for cold intolerance, heat intolerance and polydipsia.  Genitourinary: Negative for decreased urine volume, dysuria and hematuria.  Musculoskeletal: Negative for arthralgias, back pain and gait problem.  Skin: Negative for rash.  Allergic/Immunologic: Negative for environmental allergies.  Neurological: Negative for seizures, syncope, light-headedness and headaches.  Hematological: Negative  for adenopathy.  Psychiatric/Behavioral: Negative for agitation, dysphoric mood and suicidal ideas. The patient is not nervous/anxious.     Per HPI unless specifically indicated above     Objective:    BP (!) 148/97 (BP Location: Right Arm, Patient Position: Sitting, Cuff Size: Large)   Pulse 68   Temp 98.2 F (36.8 C)   Ht 5\' 9"  (1.753 m)   Wt 288 lb 12 oz (131 kg)   SpO2 98%   BMI 42.64 kg/m   Wt Readings from Last 3 Encounters:  01/01/18 288 lb 12 oz (131 kg)  11/25/17 285 lb 8 oz (129.5 kg)  10/27/17 288 lb 8 oz (130.9 kg)    Physical Exam  Constitutional: She is oriented to person, place, and time. She appears well-developed and well-nourished.  HENT:  Head: Normocephalic and atraumatic.  Neck: Neck supple.  Cardiovascular: Normal rate and regular rhythm.  Pulmonary/Chest: Effort normal and breath sounds normal.  Abdominal: Soft. Bowel sounds are normal. She exhibits no mass. There is no hepatosplenomegaly. There is no tenderness.  Musculoskeletal: She exhibits no edema.  Lymphadenopathy:    She has no cervical adenopathy.  Neurological: She is alert and oriented to person, place, and time.  Skin: Skin is warm and dry.  Psychiatric: She has a normal mood and affect. Her behavior is normal.  Vitals reviewed.   Results for orders placed or performed during the hospital encounter of 01/01/18  Potassium  Result Value Ref Range   Potassium 3.8 3.5 - 5.1 mmol/L      Assessment & Plan:   Encounter Diagnosis  Name Primary?  . Essential hypertension,  benign Yes    -reviewed labs with pt -Continue amlodipine -Restart metoprolol -Follow up 4 weeks to recheck bp.  RTO sooner prn

## 2018-01-29 ENCOUNTER — Ambulatory Visit: Payer: Medicaid Other | Admitting: Physician Assistant

## 2018-02-02 ENCOUNTER — Ambulatory Visit: Payer: Medicaid Other | Admitting: Physician Assistant

## 2018-02-10 ENCOUNTER — Encounter: Payer: Self-pay | Admitting: Physician Assistant

## 2018-04-23 ENCOUNTER — Other Ambulatory Visit: Payer: Self-pay | Admitting: Physician Assistant

## 2018-04-23 MED ORDER — AMLODIPINE BESYLATE 10 MG PO TABS: 10.0000 mg | ORAL_TABLET | Freq: Every day | ORAL | 0 refills | Status: DC

## 2018-04-30 ENCOUNTER — Encounter: Payer: Self-pay | Admitting: Physician Assistant

## 2018-04-30 ENCOUNTER — Other Ambulatory Visit (HOSPITAL_COMMUNITY)
Admission: RE | Admit: 2018-04-30 | Discharge: 2018-04-30 | Disposition: A | Discharge status: Home / Self Care | Payer: Medicaid Other | Source: Ambulatory Visit | Attending: Physician Assistant | Admitting: Physician Assistant

## 2018-04-30 ENCOUNTER — Ambulatory Visit: Payer: Medicaid Other | Admitting: Physician Assistant

## 2018-04-30 ENCOUNTER — Ambulatory Visit (HOSPITAL_COMMUNITY)
Admission: RE | Admit: 2018-04-30 | Discharge: 2018-04-30 | Disposition: A | Discharge status: Home / Self Care | Payer: Medicaid Other | Source: Ambulatory Visit | Attending: Physician Assistant | Admitting: Physician Assistant

## 2018-04-30 VITALS — BP 139/82 | HR 83 | Temp 98.1°F | Ht 69.0 in | Wt 300.0 lb

## 2018-04-30 DIAGNOSIS — F17200 Nicotine dependence, unspecified, uncomplicated: Secondary | ICD-10-CM

## 2018-04-30 DIAGNOSIS — I10 Essential (primary) hypertension: Secondary | ICD-10-CM

## 2018-04-30 LAB — BASIC METABOLIC PANEL
Anion gap: 7 (ref 5–15)
BUN: 11 mg/dL (ref 6–20)
CO2: 25 mmol/L (ref 22–32)
Calcium: 9.2 mg/dL (ref 8.9–10.3)
Chloride: 107 mmol/L (ref 98–111)
Creatinine, Ser: 0.73 mg/dL (ref 0.44–1.00)
GFR calc Af Amer: >60 mL/min (ref >60)
GFR calc non Af Amer: >60 mL/min (ref >60)
Glucose, Bld: 109 mg/dL — ABNORMAL HIGH (ref 70–99)
Potassium: 3.7 mmol/L (ref 3.5–5.1)
Sodium: 139 mmol/L (ref 135–145)

## 2018-04-30 MED ORDER — METOPROLOL TARTRATE 50 MG PO TABS: 50.0000 mg | ORAL_TABLET | Freq: Two times a day (BID) | ORAL | 1 refills | Status: DC

## 2018-04-30 MED ORDER — AMLODIPINE BESYLATE 10 MG PO TABS: 10.0000 mg | ORAL_TABLET | Freq: Every day | ORAL | 1 refills | Status: DC

## 2018-04-30 NOTE — Progress Notes (Signed)
BP 139/82 (BP Location: Right Arm, Patient Position: Sitting, Cuff Size: Large)   Pulse 83   Temp 98.1 F (36.7 C) (Other (Comment))   Ht 5\' 9"  (1.753 m)   Wt 300 lb (136.1 kg)   SpO2 98%   BMI 44.30 kg/m    Subjective:    Patient ID: Norma Nelson, female    DOB: 01-27-1986, 33 y.o.   MRN: [id number]  HPI: Norma CabalCiera D Wacha is a 33 y.o. female presenting on 04/30/2018 for Hypertension   HPI Pt doing well.  She was no-show to her October appoitnment.  She is not currently taking her amlodipine  Relevant past medical, surgical, family and social history reviewed and updated as indicated. Interim medical history since our last visit reviewed. Allergies and medications reviewed and updated.   Current Outpatient Medications:  .  metoprolol tartrate (LOPRESSOR) 50 MG tablet, Take 1 tablet (50 mg total) by mouth 2 (two) times daily., Disp: 60 tablet, Rfl: 1 .  amLODipine (NORVASC) 10 MG tablet, Take 1 tablet (10 mg total) by mouth daily., Disp: 30 tablet, Rfl: 1   Review of Systems  Constitutional: Negative for appetite change, chills, diaphoresis, fatigue, fever and unexpected weight change.  HENT: Negative for congestion, dental problem, drooling, ear pain, facial swelling, hearing loss, mouth sores, sneezing, sore throat, trouble swallowing and voice change.   Eyes: Negative for pain, discharge, redness, itching and visual disturbance.  Respiratory: Negative for cough, choking, shortness of breath and wheezing.   Cardiovascular: Negative for chest pain, palpitations and leg swelling.  Gastrointestinal: Negative for abdominal pain, blood in stool, constipation, diarrhea and vomiting.  Endocrine: Negative for cold intolerance, heat intolerance and polydipsia.  Genitourinary: Negative for decreased urine volume, dysuria and hematuria.  Musculoskeletal: Negative for arthralgias, back pain and gait problem.  Skin: Negative for rash.  Allergic/Immunologic: Negative for environmental  allergies.  Neurological: Negative for seizures, syncope, light-headedness and headaches.  Hematological: Negative for adenopathy.  Psychiatric/Behavioral: Negative for agitation, dysphoric mood and suicidal ideas. The patient is not nervous/anxious.     Per HPI unless specifically indicated above     Objective:    BP 139/82 (BP Location: Right Arm, Patient Position: Sitting, Cuff Size: Large)   Pulse 83   Temp 98.1 F (36.7 C) (Other (Comment))   Ht 5\' 9"  (1.753 m)   Wt 300 lb (136.1 kg)   SpO2 98%   BMI 44.30 kg/m   Wt Readings from Last 3 Encounters:  04/30/18 300 lb (136.1 kg)  01/01/18 288 lb 12 oz (131 kg)  11/25/17 285 lb 8 oz (129.5 kg)    Physical Exam Vitals signs reviewed.  Constitutional:      Appearance: She is well-developed.  HENT:     Head: Normocephalic and atraumatic.  Neck:     Musculoskeletal: Neck supple.  Cardiovascular:     Rate and Rhythm: Normal rate and regular rhythm.  Pulmonary:     Effort: Pulmonary effort is normal.     Breath sounds: Normal breath sounds.  Abdominal:     General: Bowel sounds are normal.     Palpations: Abdomen is soft. There is no mass.     Tenderness: There is no abdominal tenderness.  Lymphadenopathy:     Cervical: No cervical adenopathy.  Skin:    General: Skin is warm and dry.  Neurological:     Mental Status: She is alert and oriented to person, place, and time.  Psychiatric:  Behavior: Behavior normal.         Assessment & Plan:   Encounter Diagnoses  Name Primary?  . Essential hypertension, benign Yes  . Morbid obesity (HCC)   . Tobacco use disorder      -Check bmp -rx amlodipine and metoprolol for blood pressure -Will get pt signed up for [facility] -pt to Follow up 2 months to recheck

## 2018-06-02 ENCOUNTER — Other Ambulatory Visit: Payer: Self-pay | Admitting: Physician Assistant

## 2018-06-02 MED ORDER — AMLODIPINE BESYLATE 10 MG PO TABS: 10.0000 mg | ORAL_TABLET | Freq: Every day | ORAL | 0 refills | Status: DC

## 2018-06-02 MED ORDER — METOPROLOL TARTRATE 50 MG PO TABS
50.0000 mg | ORAL_TABLET | Freq: Two times a day (BID) | ORAL | 0 refills | Status: DC
Start: 2018-06-02 — End: 2018-09-09

## 2018-06-29 ENCOUNTER — Ambulatory Visit: Payer: Medicaid Other | Admitting: Physician Assistant

## 2018-07-15 ENCOUNTER — Other Ambulatory Visit: Payer: Self-pay

## 2018-07-15 ENCOUNTER — Encounter: Payer: Self-pay | Admitting: Physician Assistant

## 2018-07-15 ENCOUNTER — Ambulatory Visit: Payer: Medicaid Other | Admitting: Physician Assistant

## 2018-07-15 VITALS — BP 146/88 | HR 74 | Temp 97.9°F

## 2018-07-15 DIAGNOSIS — N6310 Unspecified lump in the right breast, unspecified quadrant: Secondary | ICD-10-CM

## 2018-07-15 NOTE — Progress Notes (Signed)
   BP (!) 146/88   Pulse 74   Temp 97.9 F (36.6 C)   SpO2 98%    Subjective:    Patient ID: Norma Nelson, female    DOB: 01-05-1986, 33 y.o.   MRN: [id number]  HPI: Norma Nelson is a 33 y.o. female presenting on 07/15/2018 for Breast Mass (R breast. pt noticed lump on Sunday 07-12-2018. pt states she feels it has gotten bigger since sunday. pt states no drainage. hard and tender to touch.)   HPI  Chief Complaint  Patient presents with  . Breast Mass    R breast. pt noticed lump on Sunday 07-12-2018. pt states she feels it has gotten bigger since sunday. pt states no drainage. hard and tender to touch.     Pt with no family history of breast cancer.   Relevant past medical, surgical, family and social history reviewed and updated as indicated. Interim medical history since our last visit reviewed. Allergies and medications reviewed and updated.   Current Outpatient Medications:  .  amLODipine (NORVASC) 10 MG tablet, Take 1 tablet (10 mg total) by mouth daily., Disp: 90 tablet, Rfl: 0 .  metoprolol tartrate (LOPRESSOR) 50 MG tablet, Take 1 tablet (50 mg total) by mouth 2 (two) times daily., Disp: 180 tablet, Rfl: 0   Review of Systems  Per HPI unless specifically indicated above     Objective:    BP (!) 146/88   Pulse 74   Temp 97.9 F (36.6 C)   SpO2 98%   Wt Readings from Last 3 Encounters:  04/30/18 300 lb (136.1 kg)  01/01/18 288 lb 12 oz (131 kg)  11/25/17 285 lb 8 oz (129.5 kg)    Physical Exam Vitals signs and nursing note reviewed.  Constitutional:      General: She is not in acute distress.    Appearance: She is obese. She is not ill-appearing.  HENT:     Head: Normocephalic and atraumatic.  Pulmonary:     Effort: Pulmonary effort is normal. No respiratory distress.  Chest:     Breasts:        Right: Mass present. No nipple discharge, skin change or tenderness.        Left: Normal.     Comments: At 6 o'clock position, mass palpable just  smaller than a golf ball.  Firm but not hard.  Mobile.  Nontender.  No skin redness or dimpling.   Skin:    General: Skin is warm and dry.  Neurological:     Mental Status: She is alert and oriented to person, place, and time.  Psychiatric:        Mood and Affect: Mood normal.        Behavior: Behavior normal.         Assessment & Plan:   Encounter Diagnosis  Name Primary?  . Breast mass, right Yes    -will order diagnostic mammogram  -Pt's Cell phone # -  [phone]    -pt to follow up for  htn in  6wk.   Pt to call office sooner prn

## 2018-07-28 ENCOUNTER — Ambulatory Visit (HOSPITAL_COMMUNITY): Payer: Self-pay

## 2018-07-28 ENCOUNTER — Encounter (HOSPITAL_COMMUNITY): Payer: Medicaid Other

## 2018-08-04 ENCOUNTER — Ambulatory Visit (HOSPITAL_COMMUNITY)
Admission: RE | Admit: 2018-08-04 | Discharge: 2018-08-04 | Disposition: A | Discharge status: Home / Self Care | Payer: Self-pay | Source: Ambulatory Visit | Attending: Physician Assistant | Admitting: Physician Assistant

## 2018-08-04 ENCOUNTER — Ambulatory Visit (HOSPITAL_COMMUNITY): Payer: Self-pay

## 2018-08-04 ENCOUNTER — Other Ambulatory Visit: Payer: Self-pay

## 2018-08-04 DIAGNOSIS — N6310 Unspecified lump in the right breast, unspecified quadrant: Secondary | ICD-10-CM

## 2018-09-09 ENCOUNTER — Encounter: Payer: Self-pay | Admitting: Physician Assistant

## 2018-09-09 ENCOUNTER — Ambulatory Visit: Payer: Medicaid Other | Admitting: Physician Assistant

## 2018-09-09 DIAGNOSIS — I10 Essential (primary) hypertension: Secondary | ICD-10-CM

## 2018-09-09 DIAGNOSIS — F17200 Nicotine dependence, unspecified, uncomplicated: Secondary | ICD-10-CM

## 2018-09-09 DIAGNOSIS — N6310 Unspecified lump in the right breast, unspecified quadrant: Secondary | ICD-10-CM

## 2018-09-09 MED ORDER — AMLODIPINE BESYLATE 10 MG PO TABS: 10.0000 mg | ORAL_TABLET | Freq: Every day | ORAL | 0 refills | Status: DC

## 2018-09-09 MED ORDER — METOPROLOL TARTRATE 50 MG PO TABS: 50.0000 mg | ORAL_TABLET | Freq: Two times a day (BID) | ORAL | 0 refills | Status: DC

## 2018-09-09 NOTE — Progress Notes (Signed)
   There were no vitals taken for this visit.   Subjective:    Patient ID: Norma Nelson, female    DOB: 26-May-1985, 33 y.o.   MRN: [id number]  HPI: Norma Nelson is a 33 y.o. female presenting on 09/09/2018 for No chief complaint on file.   HPI   This is a telemedicine appointment through [employer] due to coronavirus pandemic  I connected with  Krystn D Dascenzo on 09/09/18 by a video enabled telemedicine application and verified that I am speaking with the correct person using two identifiers.   I discussed the limitations of evaluation and management by telemedicine. The patient expressed understanding and agreed to proceed.   Pt is at home.  Provider is at office/clinic  Pt says she is doing well and has no complaints.  She is having no fevers, cp, sob, cough.  She says she is wearing a mask on the rare occasion that she goes to the store.    She says her breast is doing better (she had lump and mammogram in April)   Relevant past medical, surgical, family and social history reviewed and updated as indicated. Interim medical history since our last visit reviewed. Allergies and medications reviewed and updated.   Current Outpatient Medications:  .  amLODipine (NORVASC) 10 MG tablet, Take 1 tablet (10 mg total) by mouth daily., Disp: 90 tablet, Rfl: 0 .  metoprolol tartrate (LOPRESSOR) 50 MG tablet, Take 1 tablet (50 mg total) by mouth 2 (two) times daily., Disp: 180 tablet, Rfl: 0    Review of Systems  Per HPI unless specifically indicated above     Objective:    There were no vitals taken for this visit.  Wt Readings from Last 3 Encounters:  04/30/18 300 lb (136.1 kg)  01/01/18 288 lb 12 oz (131 kg)  11/25/17 285 lb 8 oz (129.5 kg)    Physical Exam Constitutional:      General: She is not in acute distress.    Appearance: She is obese. She is not ill-appearing.  HENT:     Head: Normocephalic and atraumatic.  Pulmonary:     Effort: Pulmonary effort is  normal. No respiratory distress.  Neurological:     Mental Status: She is alert and oriented to person, place, and time.  Psychiatric:        Attention and Perception: Attention normal.        Mood and Affect: Mood normal.        Speech: Speech normal.        Behavior: Behavior is cooperative.         Assessment & Plan:   Encounter Diagnoses  Name Primary?  . Essential hypertension, benign Yes  . Breast mass, right   . Morbid obesity (HCC)   . Tobacco use disorder      -Pt will continue current medications -encouraged pt to continue to wear masks when goes into stores -no labs at this time -pt will need f/u right diagnostic mammogram and R breast US in July -pt to follow up in 3 months.  Pt to contact office sooner prn

## 2018-10-06 ENCOUNTER — Telehealth: Payer: Self-pay | Admitting: Student

## 2018-10-06 NOTE — Telephone Encounter (Signed)
Pt called and states she has a follow up US for her breast. LPN explained to patient that her last dx mammo done 08-04-2018 recommends for a dx mammo in 3 months (after November 03, 2018). LPN explained to patient that this test has not been scheduled yet.  Pt c/o breast mass coming back. Pt states it is on same spot it was at 07-15-18 OV. LPN explained to patient that she will be called back after discussing with PA whether she needs an OV or if her dx mammo needs to be sooner than July. Pt verbalized understanding.

## 2018-10-29 ENCOUNTER — Other Ambulatory Visit (HOSPITAL_COMMUNITY): Payer: Self-pay | Admitting: Physician Assistant

## 2018-10-29 ENCOUNTER — Encounter: Payer: Self-pay | Admitting: Physician Assistant

## 2018-10-29 DIAGNOSIS — N63 Unspecified lump in unspecified breast: Secondary | ICD-10-CM

## 2018-10-29 NOTE — Progress Notes (Unsigned)
LPN called Manuela Schwartz at [facility] to f/u on pt's dx mammo appointment. Per Manuela Schwartz, the [place] form faxed on 10-07-18 was not received.  LPN faxed [employer] fund form again on 10-29-18.  Manuela Schwartz called LPN back to notify she did receive the form and has contacted patient for scheduling, but pt states the breast swelling is gone and does not want to be scheduled at this time.  Pt's most recent diagnostic mammogram and breast US from 08-04-18 recommends R diagnostic mammogram and R breat [place] in 3 months (November 03, 2018)  LPN called patient and left voicemail for patient to call back regarding getting her scheduled for dx mammo and breast US. LPN will attempt contacting patient at a later time.

## 2018-11-25 ENCOUNTER — Encounter (HOSPITAL_COMMUNITY): Payer: Self-pay | Admitting: Emergency Medicine

## 2018-11-25 ENCOUNTER — Emergency Department (HOSPITAL_COMMUNITY)
Admission: EM | Admit: 2018-11-25 | Discharge: 2018-11-25 | Disposition: A | Discharge status: Home / Self Care | Payer: Self-pay | Attending: Emergency Medicine | Admitting: Emergency Medicine

## 2018-11-25 ENCOUNTER — Other Ambulatory Visit: Payer: Self-pay

## 2018-11-25 ENCOUNTER — Emergency Department (HOSPITAL_COMMUNITY): Payer: Self-pay

## 2018-11-25 DIAGNOSIS — R7989 Other specified abnormal findings of blood chemistry: Secondary | ICD-10-CM | POA: Insufficient documentation

## 2018-11-25 DIAGNOSIS — D72829 Elevated white blood cell count, unspecified: Secondary | ICD-10-CM | POA: Insufficient documentation

## 2018-11-25 DIAGNOSIS — F17210 Nicotine dependence, cigarettes, uncomplicated: Secondary | ICD-10-CM | POA: Insufficient documentation

## 2018-11-25 DIAGNOSIS — I10 Essential (primary) hypertension: Secondary | ICD-10-CM | POA: Insufficient documentation

## 2018-11-25 DIAGNOSIS — Z79899 Other long term (current) drug therapy: Secondary | ICD-10-CM | POA: Insufficient documentation

## 2018-11-25 DIAGNOSIS — R55 Syncope and collapse: Secondary | ICD-10-CM | POA: Insufficient documentation

## 2018-11-25 LAB — CBC WITH DIFFERENTIAL/PLATELET
Abs Immature Granulocytes: 0.17 10*3/uL — ABNORMAL HIGH (ref 0.00–0.07)
Basophils Absolute: 0.1 10*3/uL (ref 0.0–0.1)
Basophils Relative: 1 %
Eosinophils Absolute: 0.6 10*3/uL — ABNORMAL HIGH (ref 0.0–0.5)
Eosinophils Relative: 3 %
HCT: 40.6 % (ref 36.0–46.0)
Hemoglobin: 12.9 g/dL (ref 12.0–15.0)
Immature Granulocytes: 1 %
Lymphocytes Relative: 16 %
Lymphs Abs: 2.9 10*3/uL (ref 0.7–4.0)
MCH: 27.1 pg (ref 26.0–34.0)
MCHC: 31.8 g/dL (ref 30.0–36.0)
MCV: 85.3 fL (ref 80.0–100.0)
Monocytes Absolute: 1.6 10*3/uL — ABNORMAL HIGH (ref 0.1–1.0)
Monocytes Relative: 9 %
Neutro Abs: 13.1 10*3/uL — ABNORMAL HIGH (ref 1.7–7.7)
Neutrophils Relative %: 70 %
Platelets: 447 10*3/uL — ABNORMAL HIGH (ref 150–400)
RBC: 4.76 MIL/uL (ref 3.87–5.11)
RDW: 14.6 % (ref 11.5–15.5)
WBC: 18.5 10*3/uL — ABNORMAL HIGH (ref 4.0–10.5)
nRBC: 0 % (ref 0.0–0.2)

## 2018-11-25 LAB — URINALYSIS, ROUTINE W REFLEX MICROSCOPIC
Bilirubin Urine: NEGATIVE
Glucose, UA: NEGATIVE mg/dL
Hgb urine dipstick: NEGATIVE
Ketones, ur: NEGATIVE mg/dL
Leukocytes,Ua: NEGATIVE
Nitrite: NEGATIVE
Protein, ur: 30 mg/dL — AB
Specific Gravity, Urine: 1.025 (ref 1.005–1.030)
pH: 5 (ref 5.0–8.0)

## 2018-11-25 LAB — COMPREHENSIVE METABOLIC PANEL
ALT: 28 U/L (ref 0–44)
AST: 21 U/L (ref 15–41)
Albumin: 3.8 g/dL (ref 3.5–5.0)
Alkaline Phosphatase: 101 U/L (ref 38–126)
Anion gap: 8 (ref 5–15)
BUN: 13 mg/dL (ref 6–20)
CO2: 24 mmol/L (ref 22–32)
Calcium: 9.6 mg/dL (ref 8.9–10.3)
Chloride: 107 mmol/L (ref 98–111)
Creatinine, Ser: 1.18 mg/dL — ABNORMAL HIGH (ref 0.44–1.00)
GFR calc Af Amer: >60 mL/min (ref >60)
GFR calc non Af Amer: >60 mL/min (ref >60)
Glucose, Bld: 104 mg/dL — ABNORMAL HIGH (ref 70–99)
Potassium: 3.6 mmol/L (ref 3.5–5.1)
Sodium: 139 mmol/L (ref 135–145)
Total Bilirubin: 0.5 mg/dL (ref 0.3–1.2)
Total Protein: 7.8 g/dL (ref 6.5–8.1)

## 2018-11-25 LAB — POC URINE PREG, ED: Preg Test, Ur: NEGATIVE

## 2018-11-25 LAB — CK: Total CK: 90 U/L (ref 38–234)

## 2018-11-25 MED ORDER — SODIUM CHLORIDE 0.9 % IV BOLUS
1000.0000 mL | Freq: Once | INTRAVENOUS | Status: AC
  Administered 2018-11-25: 1000 mL via INTRAVENOUS

## 2018-11-25 NOTE — Discharge Instructions (Signed)
Follow-up with PCP for reevaluation.  Make sure to drink fluids such as Gatorade.  Make sure to rest.  Return to the ED for any new worsening symptoms.

## 2018-11-25 NOTE — ED Triage Notes (Signed)
Pt states that she started a new job where she got to hot and pasted out she states that her boss stated that she was out for about 4 mins.she is c/o of nausea since.

## 2018-11-25 NOTE — ED Provider Notes (Signed)
[facility]ENCY [facility]PARTMENT Provider Note   CSN: [id number] Arrival date & time: 11/25/18  1202   History   Chief Complaint Chief Complaint  Patient presents with   Dehydration   Loss of Consciousness    HPI Norma Nelson is a 33 y.o. female past medical history significant for hypertension, chronic leukocytosis who presents for evaluation of syncope.  Patient states today was her first day on a new job.  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  Patient states she does an exertional job where she is constantly picking up boxes.  Patient states she has had profuse sweating due to the heat over the course of the day.  Patient states that while at work she had some dizziness.  She went to the restroom where she called her face off where she felt better.  Patient states she went to the restroom and stood up.  Patient states by the time she walked to the door she had a syncopal episode and does not remember anything at that point.  She denies preceding thunderclap headache, chest pain, shortness of breath, unilateral weakness or numbness or tingling.  Patient states she has had a syncopal episode previously which was due to heat exhaustion.  Patient states she did drink 2 bottles of hydration while at work today.  Patient states her boss told her she was laying on the floor for approximately 2-3 minutes.  Denies any seizure-like activity, tongue biting, loss of bowel or bladder incontinence.  Patient states did not initially have a headache however while she was in the waiting room for a total of 4 hours she did develop a headache.  She has not taken anything for this.  Denies lateral weakness, neck pain, neck stiffness, congestion, rhinorrhea, cough, shortness of breath, mopped assist, abdominal pain, diarrhea, dysuria, numbness or tingling in her extremities.  Denies prior history of heart disease, arrhythmias.  Denies additional aggravating or alleviating factors.  She rates  her current head pain a 4/10.  Headache started gradually.  History of pain from patient and past medical records.  No interpreter was used.     HPI  Past Medical History:  Diagnosis Date   Bronchitis    Eczema    Gonorrhea    Hypertension    Leukocytosis 2012   chronic   Mental disorder    trouble sleeping,   Papanicolaou smear of cervix with positive high risk human papilloma virus (HPV) test 09/09/2016   Trichomonosis     Patient Active Problem List   Diagnosis Date Noted   Papanicolaou smear of cervix with positive high risk human papilloma virus (HPV) test 09/09/2016   Leukocytosis 10/19/2015   Prediabetes 10/19/2015   Essential hypertension, benign 09/14/2015   Morbid obesity (HCC) 09/14/2015   Cigarette nicotine dependence without complication 09/14/2015    History reviewed. No pertinent surgical history.   OB History    Gravida  0   Para  0   Term  0   Preterm  0   AB  0   Living  0     SAB  0   TAB  0   Ectopic  0   Multiple  0   Live Births  0            Home Medications    Prior to Admission medications   Medication Sig Start Date End Date Taking? Authorizing Provider  amLODipine (NORVASC) 10 MG tablet Take 1 tablet (10  mg total) by mouth daily. 09/09/18  Yes Jacquelin HawkingMcElroy, Shannon, PA-C  metoprolol tartrate (LOPRESSOR) 50 MG tablet Take 1 tablet (50 mg total) by mouth 2 (two) times daily. 09/09/18  Yes Jacquelin HawkingMcElroy, Shannon, PA-C    Family History Family History  Problem Relation Age of Onset   Seizures Mother    Hypertension Mother    Diabetes Mother    Stroke Mother    Heart disease Mother    Cancer Mother        neuroendocrine cancer   Hypertension Maternal Uncle    Hypertension Maternal Uncle    Hypertension Maternal Uncle    Heart attack Maternal Uncle    Hypertension Maternal Uncle     Social History Social History   Tobacco Use   Smoking status: Current Every Day Smoker    Packs/day: 0.50     Years: 13.00    Pack years: 6.50    Types: Cigarettes   Smokeless tobacco: Never Used  Substance Use Topics   Alcohol use: Never    Frequency: Never    Comment: occ   Drug use: No    Types: Opium     Allergies   Hydrocodone, Lisinopril, Naproxen, Shellfish allergy, and Tramadol   Review of Systems Review of Systems  Constitutional: Negative.   HENT: Negative.   Eyes: Negative.   Respiratory: Negative.   Cardiovascular: Negative.   Gastrointestinal: Negative.   Genitourinary: Negative.   Musculoskeletal: Negative.   Skin: Negative.   Neurological: Positive for syncope and headaches. Negative for dizziness, tremors, seizures, facial asymmetry, speech difficulty, weakness, light-headedness and numbness.  All other systems reviewed and are negative.    Physical Exam Updated Vital Signs BP 126/84 (BP Location: Right Arm)    Temp 97.7 F (36.5 C)    Resp 18    Ht 5\' 11"  (1.803 m)    Wt 136.1 kg    LMP 11/22/2018    SpO2 100%    BMI 41.84 kg/m   Physical Exam Vitals signs and nursing note reviewed.  Constitutional:      General: She is not in acute distress.    Appearance: She is well-developed. She is not ill-appearing, toxic-appearing or diaphoretic.     Comments: Talking on her phone on initial evaluation.  No acute distress noted.  HENT:     Head: Atraumatic.     Nose: Nose normal.     Mouth/Throat:     Mouth: Mucous membranes are moist.     Pharynx: Oropharynx is clear.  Eyes:     Pupils: Pupils are equal, round, and reactive to light.     Comments: No horizontal, vertical or rotational nystagmus   Neck:     Musculoskeletal: Full passive range of motion without pain and normal range of motion.     Trachea: Phonation normal.     Comments: Full active and passive ROM without pain No midline or paraspinal tenderness No nuchal rigidity or meningeal signs  Cardiovascular:     Rate and Rhythm: Normal rate.     Pulses: Normal pulses.     Heart sounds: Normal  heart sounds.     Comments: No murmurs, rubs or gallops. Pulmonary:     Effort: No respiratory distress.     Comments: Clear to auscultation bilaterally without wheeze, rhonchi or rales.  No accessory muscle usage.  Speaks in full sentences without difficulty. Abdominal:     General: There is no distension.     Comments: Soft, nontender without rebound or guarding.  No CVA tenderness.  No overlying skin changes to abdominal wall.  Musculoskeletal: Normal range of motion.     Comments: Moves all 4 extremities without difficulty.  Skin:    General: Skin is warm and dry.     Comments: No rashes or lesions.  Brisk capillary refill.  Neurological:     Mental Status: She is alert.     Comments: Mental Status:  Alert, oriented, thought content appropriate. Speech fluent without evidence of aphasia. Able to follow 2 step commands without difficulty.  Cranial Nerves:  II:  Peripheral visual fields grossly normal, pupils equal, round, reactive to light III,IV, VI: ptosis not present, extra-ocular motions intact bilaterally  V,VII: smile symmetric, facial light touch sensation equal VIII: hearing grossly normal bilaterally  IX,X: midline uvula rise  XI: bilateral shoulder shrug equal and strong XII: midline tongue extension  Motor:  5/5 in upper and lower extremities bilaterally including strong and equal grip strength and dorsiflexion/plantar flexion Sensory: Pinprick and light touch normal in all extremities.  Deep Tendon Reflexes: 2+ and symmetric  Cerebellar: normal finger-to-nose with bilateral upper extremities Gait: normal gait and balance CV: distal pulses palpable throughout     ED Treatments / Results  Labs (all labs ordered are listed, but only abnormal results are displayed) Labs Reviewed  URINALYSIS, ROUTINE W REFLEX MICROSCOPIC - Abnormal; Notable for the following components:      Result Value   APPearance CLOUDY (*)    Protein, ur 30 (*)    Bacteria, UA RARE (*)    All  other components within normal limits  CBC WITH DIFFERENTIAL/PLATELET - Abnormal; Notable for the following components:   WBC 18.5 (*)    Platelets 447 (*)    Neutro Abs 13.1 (*)    Monocytes Absolute 1.6 (*)    Eosinophils Absolute 0.6 (*)    Abs Immature Granulocytes 0.17 (*)    All other components within normal limits  COMPREHENSIVE METABOLIC PANEL - Abnormal; Notable for the following components:   Glucose, Bld 104 (*)    Creatinine, Ser 1.18 (*)    All other components within normal limits  CK  POC URINE PREG, ED    EKG EKG Interpretation  Date/Time:  Wednesday November 25 2018 17:52:00 EDT Ventricular Rate:  61 PR Interval:    QRS Duration: 91 QT Interval:  431 QTC Calculation: 435 R Axis:   65 Text Interpretation:  Sinus rhythm Confirmed by Bethann Berkshire [phone]) on 11/25/2018 7:54:39 PM   Radiology Dg Chest 2 View  Result Date: 11/25/2018 CLINICAL DATA:  Syncope. Current smoker. History of bronchitis. EXAM: CHEST - 2 VIEW COMPARISON:  None. FINDINGS: Accounting for body habitus, the lungs are clear. No consolidation, features of edema, pneumothorax, or effusion. Pulmonary vascularity is normally distributed. The cardiomediastinal contours are unremarkable. No acute osseous or soft tissue abnormality. IMPRESSION: No acute cardiopulmonary abnormality. Electronically Signed   By: Kreg Shropshire M.D.   On: 11/25/2018 18:49   Ct Head Wo Contrast  Result Date: 11/25/2018 CLINICAL DATA:  Syncope. EXAM: CT HEAD WITHOUT CONTRAST TECHNIQUE: Contiguous axial images were obtained from the base of the skull through the vertex without intravenous contrast. COMPARISON:  10/06/2016 FINDINGS: Brain: No evidence of acute infarction, hemorrhage, hydrocephalus, extra-axial collection or mass lesion/mass effect. Vascular: No hyperdense vessel or unexpected calcification. Skull: Normal. Negative for fracture or focal lesion. Sinuses/Orbits: No acute finding. Other: None. IMPRESSION: Normal head CT.  Electronically Signed   By: Irish Lack M.D.   On: 11/25/2018  18:45    Procedures Procedures (including critical care time)  Medications Ordered in ED Medications  sodium chloride 0.9 % bolus 1,000 mL (1,000 mLs Intravenous New Bag/Given 11/25/18 1812)     Initial Impression / Assessment and Plan / ED Course  I have reviewed the triage vital signs and the nursing notes.  Pertinent labs & imaging results that were available during my care of the patient were reviewed by me and considered in my medical decision making (see chart for details).  33 year old female appears otherwise well presents for evaluation of syncopal event at work.  She is afebrile, nonseptic, non-ill-appearing.  Mild current headache without neurologic deficits.  No sudden onset thunderclap headache prior to or after her syncopal event.  She does work in a hot, not an [employer].  Did have some mild preceding dizziness syncopal event occurred after she stood up from toilet and walk to the door.  No seizure-like activity witnessed by bystanders.  No tongue biting, bowel or bladder incontinence.  No history of cardiac or arrhythmia disorders.  Nonfocal neurologic exam without neurologic deficits.  Heart and lungs clear.  Abdomen soft, nontender without rebound or guarding.  Will obtain labs, EKG, CT head, provide IV fluids and reevaluate.  Labs and imaging personally reviewed: CBC with leukocytosis, chronic in nature CMP with creatinine 1.18, previous 0.73, 0.82 Pregnancy negative EKG without ST/T changes.  No STEMI.  No arrhythmia. CK 30 CT head negative Orthostatic VS negative Urinalysis negative for infection Chest xray negative for infiltrates, pulmonary edema, pneumothorax, cardiomegaly  Patient has received 1 L of fluids and is tolerating p.o. intake without difficulty.  States improvement with IV fluids.  Likely syncope results of heat exhaustion.  No evidence of endorgan damage.  I have low suspicion for ACS,  PE, dissection, CVA, seizure, SAH as cause of syncope.  Patient to follow-up with PCP for reevaluation.  Discussed symptomatic management at home.  Labs are at patient's baseline. HR 72 in room. VS stable.  No tachycardia, tachypnea or hypoxia.  The patient has been appropriately medically screened and/or stabilized in the ED. I have low suspicion for any other emergent medical condition which would require further screening, evaluation or treatment in the ED or require inpatient management.  Patient is hemodynamically stable and in no acute distress.  Patient able to ambulate in department prior to ED.  Evaluation does not show acute pathology that would require ongoing or additional emergent interventions while in the emergency department or further inpatient treatment.  I have discussed the diagnosis with the patient and answered all questions.  Pain is been managed while in the emergency department and patient has no further complaints prior to discharge.  Patient is comfortable with plan discussed in room and is stable for discharge at this time.  I have discussed strict return precautions for returning to the emergency department.  Patient was encouraged to follow-up with PCP/specialist refer to at discharge.         Patient without arrhythmia or tachycardia while here in the department.  Patient without history of congestive heart failure, normal hematocrit, normal ECG, no shortness of breath and systolic blood pressure greater than 90; patient is low risk. Will plan for discharge home with close cardiology/PCP follow-up.  Possibility of recurrent syncope has been discussed. I discussed reasons to avoid driving until cardiology/PCP followup and other safety prevention including use of ladders and working at heights.   Pt has remained hemodynamically stable throughout their time in the ED  BP 126/84 (BP Location: Right Arm)    Temp 97.7 F (36.5 C)    Resp 18    Ht 5\' 11"  (1.803 m)    Wt 136.1  kg    LMP 11/22/2018    SpO2 100%    BMI 41.84 kg/m    Final Clinical Impressions(s) / ED Diagnoses   Final diagnoses:  Syncope and collapse  Elevated serum creatinine  Leukocytosis, unspecified type    ED Discharge Orders    None       Lilyian Quayle A, PA-C 11/25/18 Wilmon [id number]    Zammit, Joseph, MD 11/27/18 [phone]

## 2018-12-16 ENCOUNTER — Ambulatory Visit: Payer: Medicaid Other | Admitting: Physician Assistant

## 2018-12-16 ENCOUNTER — Encounter: Payer: Self-pay | Admitting: Physician Assistant

## 2018-12-16 DIAGNOSIS — D72829 Elevated white blood cell count, unspecified: Secondary | ICD-10-CM

## 2018-12-16 DIAGNOSIS — I10 Essential (primary) hypertension: Secondary | ICD-10-CM

## 2018-12-16 DIAGNOSIS — N6310 Unspecified lump in the right breast, unspecified quadrant: Secondary | ICD-10-CM

## 2018-12-16 DIAGNOSIS — F17200 Nicotine dependence, unspecified, uncomplicated: Secondary | ICD-10-CM

## 2018-12-16 NOTE — Progress Notes (Signed)
LMP 11/22/2018    Subjective:    Patient ID: Norma Nelson, female    DOB: [birth year], 33 y.o.   MRN: [id number]  HPI: Norma Nelson is a 33 y.o. female presenting on 12/16/2018 for Hypertension (pt states she does have a blood pressure monitor. )   HPI   This is a telemedicine appointment through [employer] due to coronavirus pandemic  I connected with  Aracelys D Penn on 12/16/18 by a video enabled telemedicine application and verified that I am speaking with the correct person using two identifiers.   I discussed the limitations of evaluation and management by telemedicine. The patient expressed understanding and agreed to proceed.  Pt is at her friends house.  Provider is at office    Pt had declined to get follow up mammogram done in July as scheduled.  Nurse discussed with her today and pt agrees to have it done.   She is not working now because she was hot at her last job so she quit.     Discussed abnormal cbc with pt again and recommended evaluation by hematologist.  Pt declines hematologist referral at this time.  She is feeling good.  No complaints today    Relevant past medical, surgical, family and social history reviewed and updated as indicated. Interim medical history since our last visit reviewed. Allergies and medications reviewed and updated.   Current Outpatient Medications:  .  amLODipine (NORVASC) 10 MG tablet, Take 1 tablet (10 mg total) by mouth daily., Disp: 90 tablet, Rfl: 0 .  metoprolol tartrate (LOPRESSOR) 50 MG tablet, Take 1 tablet (50 mg total) by mouth 2 (two) times daily., Disp: 180 tablet, Rfl: 0     Review of Systems  Per HPI unless specifically indicated above     Objective:    LMP 11/22/2018   Wt Readings from Last 3 Encounters:  11/25/18 300 lb (136.1 kg)  04/30/18 300 lb (136.1 kg)  01/01/18 288 lb 12 oz (131 kg)    Physical Exam Constitutional:      General: She is not in acute distress.    Appearance: Normal  appearance. She is obese. She is not ill-appearing.  HENT:     Head: Normocephalic and atraumatic.  Pulmonary:     Effort: Pulmonary effort is normal. No respiratory distress.  Neurological:     Mental Status: She is alert and oriented to person, place, and time.     Gait: Gait is intact. Gait normal.  Psychiatric:        Attention and Perception: Attention normal.        Speech: Speech normal.        Behavior: Behavior is cooperative.     Results for orders placed or performed during the hospital encounter of 11/25/18  Urinalysis, Routine w reflex microscopic  Result Value Ref Range   Color, Urine YELLOW YELLOW   APPearance CLOUDY (A) CLEAR   Specific Gravity, Urine 1.025 1.005 - 1.030   pH 5.0 5.0 - 8.0   Glucose, UA NEGATIVE NEGATIVE mg/dL   Hgb urine dipstick NEGATIVE NEGATIVE   Bilirubin Urine NEGATIVE NEGATIVE   Ketones, ur NEGATIVE NEGATIVE mg/dL   Protein, ur 30 (A) NEGATIVE mg/dL   Nitrite NEGATIVE NEGATIVE   Leukocytes,Ua NEGATIVE NEGATIVE   RBC / HPF 6-10 0 - 5 RBC/hpf   WBC, UA 6-10 0 - 5 WBC/hpf   Bacteria, UA RARE (A) NONE SEEN   Squamous Epithelial / LPF 6-10 0 - 5  Mucus PRESENT    Hyaline Casts, UA PRESENT   CBC with Differential  Result Value Ref Range   WBC 18.5 (H) 4.0 - 10.5 K/uL   RBC 4.76 3.87 - 5.11 MIL/uL   Hemoglobin 12.9 12.0 - 15.0 g/dL   HCT 40.940.6 81.136.0 - 91.446.0 %   MCV 85.3 80.0 - 100.0 fL   MCH 27.1 26.0 - 34.0 pg   MCHC 31.8 30.0 - 36.0 g/dL   RDW 78.214.6 95.611.5 - 21.315.5 %   Platelets 447 (H) 150 - 400 K/uL   nRBC 0.0 0.0 - 0.2 %   Neutrophils Relative % 70 %   Neutro Abs 13.1 (H) 1.7 - 7.7 K/uL   Lymphocytes Relative 16 %   Lymphs Abs 2.9 0.7 - 4.0 K/uL   Monocytes Relative 9 %   Monocytes Absolute 1.6 (H) 0.1 - 1.0 K/uL   Eosinophils Relative 3 %   Eosinophils Absolute 0.6 (H) 0.0 - 0.5 K/uL   Basophils Relative 1 %   Basophils Absolute 0.1 0.0 - 0.1 K/uL   Immature Granulocytes 1 %   Abs Immature Granulocytes 0.17 (H) 0.00 - 0.07 K/uL   Comprehensive metabolic panel  Result Value Ref Range   Sodium 139 135 - 145 mmol/L   Potassium 3.6 3.5 - 5.1 mmol/L   Chloride 107 98 - 111 mmol/L   CO2 24 22 - 32 mmol/L   Glucose, Bld 104 (H) 70 - 99 mg/dL   BUN 13 6 - 20 mg/dL   Creatinine, Ser 0.861.18 (H) 0.44 - 1.00 mg/dL   Calcium 9.6 8.9 - 57.810.3 mg/dL   Total Protein 7.8 6.5 - 8.1 g/dL   Albumin 3.8 3.5 - 5.0 g/dL   AST 21 15 - 41 U/L   ALT 28 0 - 44 U/L   Alkaline Phosphatase 101 38 - 126 U/L   Total Bilirubin 0.5 0.3 - 1.2 mg/dL   GFR calc non Af Amer >60 >60 mL/min   GFR calc Af Amer >60 >60 mL/min   Anion gap 8 5 - 15  CK  Result Value Ref Range   Total CK 90 38 - 234 U/L  POC urine preg, ED  Result Value Ref Range   Preg Test, Ur NEGATIVE NEGATIVE      Assessment & Plan:    Encounter Diagnoses  Name Primary?  . Essential hypertension, benign Yes  . Breast mass, right   . Morbid obesity (HCC)   . Tobacco use disorder   . Leukocytosis, unspecified type      -Pt to continue current medications for blood pressure  -Pt will get follow up mammogram done  -Pt to follow up 3 months.  She is to contact office sooner prn

## 2019-01-22 ENCOUNTER — Emergency Department (HOSPITAL_COMMUNITY)
Admission: EM | Admit: 2019-01-22 | Discharge: 2019-01-22 | Disposition: A | Discharge status: Home / Self Care | Payer: Medicaid Other | Attending: Emergency Medicine | Admitting: Emergency Medicine

## 2019-01-22 ENCOUNTER — Other Ambulatory Visit: Payer: Self-pay

## 2019-01-22 ENCOUNTER — Encounter (HOSPITAL_COMMUNITY): Payer: Self-pay | Admitting: *Deleted

## 2019-01-22 DIAGNOSIS — F17210 Nicotine dependence, cigarettes, uncomplicated: Secondary | ICD-10-CM | POA: Insufficient documentation

## 2019-01-22 DIAGNOSIS — Z79899 Other long term (current) drug therapy: Secondary | ICD-10-CM | POA: Insufficient documentation

## 2019-01-22 DIAGNOSIS — I10 Essential (primary) hypertension: Secondary | ICD-10-CM | POA: Insufficient documentation

## 2019-01-22 DIAGNOSIS — R197 Diarrhea, unspecified: Secondary | ICD-10-CM

## 2019-01-22 NOTE — Discharge Instructions (Signed)
Bland diet (bananas, rice, applesauce, dry toast) and advance slowly as tolerated. You may take Peptobismal or Imodium as directed as needed.  Follow up with your doctor for outpatient COVID test if your symptoms continue or if you develop any other symptoms.

## 2019-01-22 NOTE — ED Triage Notes (Signed)
States she has diarrhea onset this am and needs note for work, denies any pain

## 2019-01-22 NOTE — ED Provider Notes (Signed)
[facility] EMERGENCY DEPARTMENT Provider Note   CSN: [id number] Arrival date & time: 01/22/19  1138     History   Chief Complaint Chief Complaint  Patient presents with  . Diarrhea  . Letter for School/Work    HPI Norma Nelson is a 33 y.o. female.     33yo female with complaint of diarrhea, onset last night after eating a well cooked burger from [employer]. Reports 3 episodes on non bloody, loose stools last night, 2 today, called her manager at work and was told she needed a note for work. Patient denies fevers, chills, abdominal pain, nausea, vomiting, changes in bladder habits. No known sick contacts, although patient is here with her friend with similar symptoms. Denies any significant PMH, no history of immune deficiency. No other complaints or concerns.  Norma Nelson was evaluated in Emergency Department on 01/22/2019 for the symptoms described in the history of present illness. She was evaluated in the context of the global COVID-19 pandemic, which necessitated consideration that the patient might be at risk for infection with the SARS-CoV-2 virus that causes COVID-19. Institutional protocols and algorithms that pertain to the evaluation of patients at risk for COVID-19 are in a state of rapid change based on information released by regulatory bodies including the CDC and federal and state organizations. These policies and algorithms were followed during the patient's care in the ED.      Past Medical History:  Diagnosis Date  . Bronchitis   . Eczema   . Gonorrhea   . Hypertension   . Leukocytosis 2012   chronic  . Mental disorder    trouble sleeping,  . Papanicolaou smear of cervix with positive high risk human papilloma virus (HPV) test 09/09/2016  . Trichomonosis     Patient Active Problem List   Diagnosis Date Noted  . Papanicolaou smear of cervix with positive high risk human papilloma virus (HPV) test 09/09/2016  . Leukocytosis 10/19/2015  . Prediabetes  10/19/2015  . Essential hypertension, benign 09/14/2015  . Morbid obesity (HCC) 09/14/2015  . Cigarette nicotine dependence without complication 09/14/2015    History reviewed. No pertinent surgical history.   OB History    Gravida  0   Para  0   Term  0   Preterm  0   AB  0   Living  0     SAB  0   TAB  0   Ectopic  0   Multiple  0   Live Births  0            Home Medications    Prior to Admission medications   Medication Sig Start Date End Date Taking? Authorizing Provider  amLODipine (NORVASC) 10 MG tablet Take 1 tablet (10 mg total) by mouth daily. 09/09/18   Jacquelin Hawking, PA-C  metoprolol tartrate (LOPRESSOR) 50 MG tablet Take 1 tablet (50 mg total) by mouth 2 (two) times daily. 09/09/18   Jacquelin Hawking, PA-C    Family History Family History  Problem Relation Age of Onset  . Seizures Mother   . Hypertension Mother   . Diabetes Mother   . Stroke Mother   . Heart disease Mother   . Cancer Mother        neuroendocrine cancer  . Hypertension Maternal Uncle   . Hypertension Maternal Uncle   . Hypertension Maternal Uncle   . Heart attack Maternal Uncle   . Hypertension Maternal Uncle     Social History Social History  Tobacco Use  . Smoking status: Current Every Day Smoker    Packs/day: 0.50    Years: 13.00    Pack years: 6.50    Types: Cigarettes  . Smokeless tobacco: Never Used  Substance Use Topics  . Alcohol use: Never    Frequency: Never    Comment: occ  . Drug use: No    Types: Opium     Allergies   Hydrocodone, Lisinopril, Naproxen, Shellfish allergy, and Tramadol   Review of Systems Review of Systems  Constitutional: Negative for chills, diaphoresis and fever.  Respiratory: Negative for cough.   Cardiovascular: Negative for chest pain.  Gastrointestinal: Positive for diarrhea. Negative for abdominal pain, blood in stool, constipation, nausea and vomiting.  Genitourinary: Negative for dysuria, frequency and  urgency.  Musculoskeletal: Negative for arthralgias and myalgias.  Skin: Negative for rash and wound.  Allergic/Immunologic: Negative for immunocompromised state.  Neurological: Negative for dizziness and weakness.  Hematological: Negative for adenopathy.  Psychiatric/Behavioral: Negative for confusion.  All other systems reviewed and are negative.    Physical Exam Updated Vital Signs BP (!) 142/95   Pulse 62   Temp 97.9 F (36.6 C)   Resp 18   Ht 5\' 11"  (1.803 m)   Wt (!) 145 kg   LMP 01/18/2019   SpO2 95%   BMI 44.58 kg/m   Physical Exam Vitals signs and nursing note reviewed.  Constitutional:      General: She is not in acute distress.    Appearance: She is well-developed. She is not diaphoretic.  HENT:     Head: Normocephalic and atraumatic.  Cardiovascular:     Rate and Rhythm: Normal rate and regular rhythm.     Pulses: Normal pulses.     Heart sounds: Normal heart sounds.  Pulmonary:     Effort: Pulmonary effort is normal.     Breath sounds: Normal breath sounds.  Abdominal:     General: Bowel sounds are normal.     Palpations: Abdomen is soft.     Tenderness: There is no abdominal tenderness.  Skin:    General: Skin is warm and dry.     Findings: No erythema or rash.  Neurological:     Mental Status: She is alert and oriented to person, place, and time.  Psychiatric:        Behavior: Behavior normal.      ED Treatments / Results  Labs (all labs ordered are listed, but only abnormal results are displayed) Labs Reviewed - No data to display  EKG None  Radiology No results found.  Procedures Procedures (including critical care time)  Medications Ordered in ED Medications - No data to display   Initial Impression / Assessment and Plan / ED Course  I have reviewed the triage vital signs and the nursing notes.  Pertinent labs & imaging results that were available during my care of the patient were reviewed by me and considered in my medical  decision making (see chart for details).  Clinical Course as of Jan 21 1230  Fri Jan 22, 2019  1230 33yo female with diarrhea after eating at a [employer] restaurant yesterday, here with friend with similar symptoms. Denies fevers, abdominal pain, vomiting or any other complaints.  Abdomen is soft and nontender, bowel sounds are normal.  Symptoms likely related to enteritis or colitis, recommend bland diet, given work note as requested.  Also discussed possibility of early COVID symptoms and if symptoms progress she should seek outpatient testing.   [LM]  Clinical Course User Index [LM] Tacy Learn, PA-C       Final Clinical Impressions(s) / ED Diagnoses   Final diagnoses:  Diarrhea, unspecified type    ED Discharge Orders    None       Roque Lias 01/22/19 1232    Hayden Rasmussen, MD 01/22/19 [phone]

## 2019-01-26 ENCOUNTER — Other Ambulatory Visit: Payer: Self-pay

## 2019-01-26 ENCOUNTER — Ambulatory Visit (HOSPITAL_COMMUNITY)
Admission: RE | Admit: 2019-01-26 | Discharge: 2019-01-26 | Disposition: A | Discharge status: Home / Self Care | Payer: Self-pay | Source: Ambulatory Visit | Attending: Physician Assistant | Admitting: Physician Assistant

## 2019-01-26 DIAGNOSIS — N63 Unspecified lump in unspecified breast: Secondary | ICD-10-CM

## 2019-01-29 ENCOUNTER — Emergency Department (HOSPITAL_COMMUNITY)
Admission: EM | Admit: 2019-01-29 | Discharge: 2019-01-29 | Disposition: A | Discharge status: Home / Self Care | Payer: Medicaid Other | Attending: Emergency Medicine | Admitting: Emergency Medicine

## 2019-01-29 ENCOUNTER — Encounter (HOSPITAL_COMMUNITY): Payer: Self-pay | Admitting: *Deleted

## 2019-01-29 ENCOUNTER — Other Ambulatory Visit: Payer: Self-pay

## 2019-01-29 DIAGNOSIS — Z20828 Contact with and (suspected) exposure to other viral communicable diseases: Secondary | ICD-10-CM | POA: Insufficient documentation

## 2019-01-29 DIAGNOSIS — F17210 Nicotine dependence, cigarettes, uncomplicated: Secondary | ICD-10-CM | POA: Insufficient documentation

## 2019-01-29 DIAGNOSIS — M791 Myalgia, unspecified site: Secondary | ICD-10-CM | POA: Insufficient documentation

## 2019-01-29 DIAGNOSIS — I10 Essential (primary) hypertension: Secondary | ICD-10-CM | POA: Insufficient documentation

## 2019-01-29 DIAGNOSIS — R52 Pain, unspecified: Secondary | ICD-10-CM

## 2019-01-29 DIAGNOSIS — Z79899 Other long term (current) drug therapy: Secondary | ICD-10-CM | POA: Insufficient documentation

## 2019-01-29 MED ORDER — ACETAMINOPHEN 325 MG PO TABS
650.0000 mg | ORAL_TABLET | Freq: Once | ORAL | Status: AC
  Administered 2019-01-29: 650 mg via ORAL
  Filled 2019-01-29: qty 2

## 2019-01-29 NOTE — ED Triage Notes (Signed)
Patient presents to the ED with complaints of general body aches and loss of taste for one day.  Patient states family member tested positive for covid yesterday.

## 2019-01-29 NOTE — ED Provider Notes (Signed)
[facility] EMERGENCY DEPARTMENT Provider Note   CSN: [id number] Arrival date & time: 01/29/19  [id number]     History   Chief Complaint Chief Complaint  Patient presents with  . Generalized Body Aches    HPI Norma Nelson is a 33 y.o. female.     HPI   Norma Nelson is a 33 y.o. female who presents to the Emergency Department complaining of generalized body aches and loss of taste.  Symptoms began yesterday.  She states that her godson tested positive for COVID-19 earlier this week.  She states that she has been in close proximity to him.  Other family members have been tested earlier this week as well, but no one else tested positive.  She also endorses chills last evening.  She denies cough, shortness of breath, chest pain, fever, abdominal pain, vomiting or diarrhea.  She has not taken any Tylenol or ibuprofen for symptom relief.  No rash or recent tick bite.   Past Medical History:  Diagnosis Date  . Bronchitis   . Eczema   . Gonorrhea   . Hypertension   . Leukocytosis 2012   chronic  . Mental disorder    trouble sleeping,  . Papanicolaou smear of cervix with positive high risk human papilloma virus (HPV) test 09/09/2016  . Trichomonosis     Patient Active Problem List   Diagnosis Date Noted  . Papanicolaou smear of cervix with positive high risk human papilloma virus (HPV) test 09/09/2016  . Leukocytosis 10/19/2015  . Prediabetes 10/19/2015  . Essential hypertension, benign 09/14/2015  . Morbid obesity (HCC) 09/14/2015  . Cigarette nicotine dependence without complication 09/14/2015    History reviewed. No pertinent surgical history.   OB History    Gravida  0   Para  0   Term  0   Preterm  0   AB  0   Living  0     SAB  0   TAB  0   Ectopic  0   Multiple  0   Live Births  0            Home Medications    Prior to Admission medications   Medication Sig Start Date End Date Taking? Authorizing Provider  amLODipine (NORVASC) 10  MG tablet Take 1 tablet (10 mg total) by mouth daily. 09/09/18   Jacquelin HawkingMcElroy, Shannon, PA-C  metoprolol tartrate (LOPRESSOR) 50 MG tablet Take 1 tablet (50 mg total) by mouth 2 (two) times daily. 09/09/18   Jacquelin HawkingMcElroy, Shannon, PA-C    Family History Family History  Problem Relation Age of Onset  . Seizures Mother   . Hypertension Mother   . Diabetes Mother   . Stroke Mother   . Heart disease Mother   . Cancer Mother        neuroendocrine cancer  . Hypertension Maternal Uncle   . Hypertension Maternal Uncle   . Hypertension Maternal Uncle   . Heart attack Maternal Uncle   . Hypertension Maternal Uncle     Social History Social History   Tobacco Use  . Smoking status: Current Every Day Smoker    Packs/day: 0.50    Years: 13.00    Pack years: 6.50    Types: Cigarettes  . Smokeless tobacco: Never Used  Substance Use Topics  . Alcohol use: Never    Frequency: Never    Comment: occ  . Drug use: No    Types: Opium     Allergies   Hydrocodone, Lisinopril,  Naproxen, Shellfish allergy, and Tramadol   Review of Systems Review of Systems  Constitutional: Positive for chills. Negative for activity change, appetite change, fatigue and fever.  HENT: Negative for sore throat and trouble swallowing.   Respiratory: Negative for cough, shortness of breath and wheezing.   Cardiovascular: Negative for chest pain and palpitations.  Gastrointestinal: Negative for abdominal pain, blood in stool, nausea and vomiting.  Genitourinary: Negative for dysuria, flank pain and hematuria.  Musculoskeletal: Positive for myalgias. Negative for arthralgias, back pain, neck pain and neck stiffness.  Skin: Negative for rash.  Neurological: Negative for dizziness, weakness and numbness.  Hematological: Does not bruise/bleed easily.     Physical Exam Updated Vital Signs BP (!) 155/91 (BP Location: Right Arm)   Pulse 74   Temp 97.8 F (36.6 C) (Oral)   Resp 17   Ht 5\' 11"  (1.803 m)   Wt (!) 145 kg    LMP 01/18/2019   SpO2 100%   BMI 44.58 kg/m   Physical Exam Vitals signs and nursing note reviewed.  Constitutional:      Appearance: Normal appearance. She is obese. She is not ill-appearing or toxic-appearing.  HENT:     Head: Atraumatic.     Right Ear: Tympanic membrane and ear canal normal.     Left Ear: Tympanic membrane and ear canal normal.     Nose: Nose normal.     Mouth/Throat:     Mouth: Mucous membranes are moist.     Pharynx: Oropharynx is clear. No oropharyngeal exudate or posterior oropharyngeal erythema.  Neck:     Musculoskeletal: Normal range of motion. No muscular tenderness.  Cardiovascular:     Rate and Rhythm: Normal rate and regular rhythm.     Pulses: Normal pulses.  Pulmonary:     Effort: Pulmonary effort is normal. No respiratory distress.     Breath sounds: Normal breath sounds. No wheezing.  Chest:     Chest wall: No tenderness.  Abdominal:     Palpations: Abdomen is soft. There is no mass.     Tenderness: There is no abdominal tenderness. There is no guarding.  Musculoskeletal: Normal range of motion.     Right lower leg: No edema.     Left lower leg: No edema.  Lymphadenopathy:     Cervical: No cervical adenopathy.  Skin:    General: Skin is warm.     Capillary Refill: Capillary refill takes less than 2 seconds.     Findings: No rash.  Neurological:     General: No focal deficit present.     Mental Status: She is alert.     Sensory: Sensation is intact. No sensory deficit.     Motor: Motor function is intact. No weakness.     Gait: Gait is intact.      ED Treatments / Results  Labs (all labs ordered are listed, but only abnormal results are displayed) Labs Reviewed  NOVEL CORONAVIRUS, NAA (HOSP ORDER, SEND-OUT TO REF LAB; TAT 18-24 HRS)    EKG None  Radiology No results found.  Procedures Procedures (including critical care time)  Medications Ordered in ED Medications  acetaminophen (TYLENOL) tablet 650 mg (650 mg Oral  Given 01/29/19 0815)     Initial Impression / Assessment and Plan / ED Course  I have reviewed the triage vital signs and the nursing notes.  Pertinent labs & imaging results that were available during my care of the patient were reviewed by me and considered in my medical  decision making (see chart for details).        Patient with generalized myalgias and loss of taste.  She endorses recent exposure to a family member with COVID-19.  Patient states that she had a negative COVID test on Monday.  She is well-appearing.  Nontoxic.  Vital signs reviewed.  No tachycardia, fever or hypoxia.  She appears appropriate for discharge home, she will have sent out testing COVID-19 here.  She agrees to isolate at home until test results are back or she is at least 7 to 10 days free of symptoms.  Return precautions discussed.  Final Clinical Impressions(s) / ED Diagnoses   Final diagnoses:  Body aches    ED Discharge Orders    None       Kem Parkinson, PA-C 01/29/19 Goodnight, [place], DO 02/03/19 2349

## 2019-01-29 NOTE — Discharge Instructions (Signed)
You have been tested today for COVID-19.  Your results should be back next week.  You may access your results on MyChart.  You can take Tylenol every 4 hours if you develop fever.  Try to isolate yourself at home as much as possible at least until your test results are back or you are 7 to 10 days free from symptoms including fever.

## 2019-01-30 LAB — NOVEL CORONAVIRUS, NAA (HOSP ORDER, SEND-OUT TO REF LAB; TAT 18-24 HRS): SARS-CoV-2, NAA: NOT DETECTED

## 2019-02-03 ENCOUNTER — Ambulatory Visit: Payer: Medicaid Other | Admitting: Physician Assistant

## 2019-02-05 ENCOUNTER — Other Ambulatory Visit: Payer: Self-pay

## 2019-02-05 DIAGNOSIS — Z20822 Contact with and (suspected) exposure to covid-19: Secondary | ICD-10-CM

## 2019-02-06 LAB — NOVEL CORONAVIRUS, NAA: SARS-CoV-2, NAA: NOT DETECTED

## 2019-02-10 ENCOUNTER — Other Ambulatory Visit: Payer: Self-pay

## 2019-02-10 DIAGNOSIS — Z20822 Contact with and (suspected) exposure to covid-19: Secondary | ICD-10-CM

## 2019-02-12 LAB — NOVEL CORONAVIRUS, NAA: SARS-CoV-2, NAA: DETECTED — AB

## 2019-02-19 ENCOUNTER — Other Ambulatory Visit: Payer: Self-pay

## 2019-02-19 DIAGNOSIS — Z20822 Contact with and (suspected) exposure to covid-19: Secondary | ICD-10-CM

## 2019-02-20 LAB — NOVEL CORONAVIRUS, NAA: SARS-CoV-2, NAA: NOT DETECTED

## 2019-03-31 ENCOUNTER — Other Ambulatory Visit: Payer: Self-pay

## 2019-03-31 DIAGNOSIS — Z79899 Other long term (current) drug therapy: Secondary | ICD-10-CM | POA: Insufficient documentation

## 2019-03-31 DIAGNOSIS — I10 Essential (primary) hypertension: Secondary | ICD-10-CM | POA: Insufficient documentation

## 2019-03-31 DIAGNOSIS — Z20828 Contact with and (suspected) exposure to other viral communicable diseases: Secondary | ICD-10-CM | POA: Insufficient documentation

## 2019-03-31 DIAGNOSIS — F17210 Nicotine dependence, cigarettes, uncomplicated: Secondary | ICD-10-CM | POA: Insufficient documentation

## 2019-03-31 DIAGNOSIS — J069 Acute upper respiratory infection, unspecified: Secondary | ICD-10-CM | POA: Insufficient documentation

## 2019-04-01 ENCOUNTER — Encounter (HOSPITAL_COMMUNITY): Payer: Self-pay | Admitting: *Deleted

## 2019-04-01 ENCOUNTER — Emergency Department (HOSPITAL_COMMUNITY)
Admission: EM | Admit: 2019-04-01 | Discharge: 2019-04-01 | Disposition: A | Discharge status: Home / Self Care | Payer: Medicaid Other | Attending: Emergency Medicine | Admitting: Emergency Medicine

## 2019-04-01 ENCOUNTER — Other Ambulatory Visit: Payer: Self-pay

## 2019-04-01 DIAGNOSIS — J069 Acute upper respiratory infection, unspecified: Secondary | ICD-10-CM

## 2019-04-01 LAB — SARS CORONAVIRUS 2 (TAT 6-24 HRS): SARS Coronavirus 2: NEGATIVE

## 2019-04-01 NOTE — ED Triage Notes (Signed)
Pt c/o generalized body aches with loss of taste and smell that started yesterday

## 2019-04-01 NOTE — Discharge Instructions (Signed)
If you or anyone you know requires Covid testing, you can get it as an outpatient here at the hospital Tuesday through Friday between the hours of 10AM and 3 PM.

## 2019-04-01 NOTE — ED Provider Notes (Signed)
[facility] EMERGENCY DEPARTMENT Provider Note   CSN: [id number] Arrival date & time: 03/31/19  2246     History Chief Complaint  Patient presents with  . Generalized Body Aches    Norma Nelson is a 33 y.o. female.  Patient presents to the emergency department for evaluation of generalized body aches, chills, fatigue with loss of smell and taste.  She has had a slight cough but no difficulty breathing.  No nausea, vomiting or diarrhea.  Patient reports that she had a positive Covid test in October.  Her symptoms currently are identical.        Past Medical History:  Diagnosis Date  . Bronchitis   . Eczema   . Gonorrhea   . Hypertension   . Leukocytosis 2012   chronic  . Mental disorder    trouble sleeping,  . Papanicolaou smear of cervix with positive high risk human papilloma virus (HPV) test 09/09/2016  . Trichomonosis     Patient Active Problem List   Diagnosis Date Noted  . Papanicolaou smear of cervix with positive high risk human papilloma virus (HPV) test 09/09/2016  . Leukocytosis 10/19/2015  . Prediabetes 10/19/2015  . Essential hypertension, benign 09/14/2015  . Morbid obesity (HCC) 09/14/2015  . Cigarette nicotine dependence without complication 09/14/2015    History reviewed. No pertinent surgical history.   OB History    Gravida  0   Para  0   Term  0   Preterm  0   AB  0   Living  0     SAB  0   TAB  0   Ectopic  0   Multiple  0   Live Births  0           Family History  Problem Relation Age of Onset  . Seizures Mother   . Hypertension Mother   . Diabetes Mother   . Stroke Mother   . Heart disease Mother   . Cancer Mother        neuroendocrine cancer  . Hypertension Maternal Uncle   . Hypertension Maternal Uncle   . Hypertension Maternal Uncle   . Heart attack Maternal Uncle   . Hypertension Maternal Uncle     Social History   Tobacco Use  . Smoking status: Current Every Day Smoker    Packs/day: 0.50    Years: 13.00    Pack years: 6.50    Types: Cigarettes  . Smokeless tobacco: Never Used  Substance Use Topics  . Alcohol use: Never    Comment: occ  . Drug use: No    Types: Opium    Home Medications Prior to Admission medications   Medication Sig Start Date End Date Taking? Authorizing Provider  amLODipine (NORVASC) 10 MG tablet Take 1 tablet (10 mg total) by mouth daily. 09/09/18   Jacquelin Hawking, PA-C  metoprolol tartrate (LOPRESSOR) 50 MG tablet Take 1 tablet (50 mg total) by mouth 2 (two) times daily. 09/09/18   Jacquelin Hawking, PA-C    Allergies    Hydrocodone, Lisinopril, Naproxen, Shellfish allergy, and Tramadol  Review of Systems   Review of Systems  Constitutional: Positive for chills and fever.  Respiratory: Positive for cough.   All other systems reviewed and are negative.   Physical Exam Updated Vital Signs BP (!) 151/84 (BP Location: Left Arm)   Pulse 61   Temp 98.7 F (37.1 C) (Oral)   Resp 18   Ht 5\' 11"  (1.803 m)   Wt )  140.6 kg   LMP 03/19/2019   SpO2 100%   BMI 43.24 kg/m   Physical Exam Vitals and nursing note reviewed.  Constitutional:      General: She is not in acute distress.    Appearance: Normal appearance. She is well-developed.  HENT:     Head: Normocephalic and atraumatic.     Right Ear: Hearing normal.     Left Ear: Hearing normal.     Nose: Nose normal.  Eyes:     Conjunctiva/sclera: Conjunctivae normal.     Pupils: Pupils are equal, round, and reactive to light.  Cardiovascular:     Rate and Rhythm: Regular rhythm.     Heart sounds: S1 normal and S2 normal. No murmur. No friction rub. No gallop.   Pulmonary:     Effort: Pulmonary effort is normal. No respiratory distress.     Breath sounds: Normal breath sounds.  Chest:     Chest wall: No tenderness.  Abdominal:     General: Bowel sounds are normal.     Palpations: Abdomen is soft.     Tenderness: There is no abdominal tenderness. There is no guarding or rebound.  Negative signs include Murphy's sign and McBurney's sign.     Hernia: No hernia is present.  Musculoskeletal:        General: Normal range of motion.     Cervical back: Normal range of motion and neck supple.  Skin:    General: Skin is warm and dry.     Findings: No rash.  Neurological:     Mental Status: She is alert and oriented to person, place, and time.     GCS: GCS eye subscore is 4. GCS verbal subscore is 5. GCS motor subscore is 6.     Cranial Nerves: No cranial nerve deficit.     Sensory: No sensory deficit.     Coordination: Coordination normal.  Psychiatric:        Speech: Speech normal.        Behavior: Behavior normal.        Thought Content: Thought content normal.     ED Results / Procedures / Treatments   Labs (all labs ordered are listed, but only abnormal results are displayed) Labs Reviewed  SARS CORONAVIRUS 2 (TAT 6-24 HRS)    EKG None  Radiology No results found.  Procedures Procedures (including critical care time)  Medications Ordered in ED Medications - No data to display  ED Course  I have reviewed the triage vital signs and the nursing notes.  Pertinent labs & imaging results that were available during my care of the patient were reviewed by me and considered in my medical decision making (see chart for details).    MDM Rules/Calculators/A&P                   Patient presents with concerns over recurrent Covid infection.  She has fever, chills, loss of sense of smell and taste as well as cough.  She appears well.  Oxygenation is 100% on room air.  She is in no distress currently.  Lungs are clear.  Patient reports that she needs a work note to return to her job that she has been out with his recent illness that started a couple of days ago.  Will provide Covid testing.  Final Clinical Impression(s) / ED Diagnoses Final diagnoses:  Upper respiratory tract infection, unspecified type    Rx / DC Orders ED Discharge Orders    None  Gilda CreasePollina, Maryan Sivak J, MD 04/01/19 [phone]

## 2019-04-02 ENCOUNTER — Other Ambulatory Visit: Payer: Self-pay

## 2019-04-02 DIAGNOSIS — Z20822 Contact with and (suspected) exposure to covid-19: Secondary | ICD-10-CM

## 2019-04-03 ENCOUNTER — Other Ambulatory Visit: Payer: Self-pay

## 2019-04-03 ENCOUNTER — Encounter (HOSPITAL_COMMUNITY): Payer: Self-pay | Admitting: Emergency Medicine

## 2019-04-03 ENCOUNTER — Emergency Department (HOSPITAL_COMMUNITY)
Admission: EM | Admit: 2019-04-03 | Discharge: 2019-04-03 | Disposition: A | Discharge status: Home / Self Care | Payer: Medicaid Other | Attending: Emergency Medicine | Admitting: Emergency Medicine

## 2019-04-03 DIAGNOSIS — Z5321 Procedure and treatment not carried out due to patient leaving prior to being seen by health care provider: Secondary | ICD-10-CM | POA: Insufficient documentation

## 2019-04-03 DIAGNOSIS — R197 Diarrhea, unspecified: Secondary | ICD-10-CM | POA: Insufficient documentation

## 2019-04-03 DIAGNOSIS — R439 Unspecified disturbances of smell and taste: Secondary | ICD-10-CM | POA: Insufficient documentation

## 2019-04-03 DIAGNOSIS — R110 Nausea: Secondary | ICD-10-CM | POA: Insufficient documentation

## 2019-04-03 HISTORY — DX: COVID-19: U07.1

## 2019-04-03 LAB — NOVEL CORONAVIRUS, NAA: SARS-CoV-2, NAA: NOT DETECTED

## 2019-04-03 NOTE — ED Triage Notes (Addendum)
Patient c/o diarrhea, nausea, and loss of taste that started Thursday. Per patient seen here in ED and was tested-negative. Symptoms not resolving. Patient states had Covid in October and had to be tested x3 for positive results to show. Denies any fevers, vomiting, or coughing.

## 2019-04-12 ENCOUNTER — Other Ambulatory Visit: Payer: Self-pay | Admitting: Physician Assistant

## 2019-04-12 DIAGNOSIS — D72829 Elevated white blood cell count, unspecified: Secondary | ICD-10-CM

## 2019-04-12 DIAGNOSIS — R7303 Prediabetes: Secondary | ICD-10-CM

## 2019-04-12 DIAGNOSIS — I10 Essential (primary) hypertension: Secondary | ICD-10-CM

## 2019-04-12 DIAGNOSIS — Z13220 Encounter for screening for lipoid disorders: Secondary | ICD-10-CM

## 2019-04-27 ENCOUNTER — Ambulatory Visit: Payer: Medicaid Other | Admitting: Physician Assistant

## 2019-04-27 ENCOUNTER — Encounter: Payer: Self-pay | Admitting: Physician Assistant

## 2019-04-27 DIAGNOSIS — I10 Essential (primary) hypertension: Secondary | ICD-10-CM

## 2019-04-27 DIAGNOSIS — N6001 Solitary cyst of right breast: Secondary | ICD-10-CM

## 2019-04-27 MED ORDER — DICLOXACILLIN SODIUM 500 MG PO CAPS: 500.0000 mg | ORAL_CAPSULE | Freq: Four times a day (QID) | ORAL | 0 refills | Status: DC

## 2019-04-27 NOTE — Progress Notes (Signed)
   LMP 04/03/2019    Subjective:    Patient ID: Norma Nelson, female    DOB: [birth year], 34 y.o.   MRN: [id number]  HPI: Norma Nelson is a 34 y.o. female presenting on 04/27/2019 for No chief complaint on file.   HPI   This is a telemedicine appointment through [employer] due to coronavirus pandemic  I connected with  Norma Nelson on 04/27/19 by a video enabled telemedicine application and verified that I am speaking with the correct person using two identifiers.   I discussed the limitations of evaluation and management by telemedicine. The patient expressed understanding and agreed to proceed.  Pt is at home.  Provider is at office.    Pt is 33yoF with appointment today for follow-up on HTN but she wants to discuss breast lump.   She isn't working now  She doesn't know where her bp machine is  She Didn't get her labs done  She says she needs antibiotic for "breast thing started again".  She Says mass came back 2 wk ago.   Pt had f/u US done in October.  It is in R  Breast- she says it's in exact same place as before.    Pt Seen here in office in march 2020 for this "breast thing"  Pt had covid-19 in October.  She says she is feeling well from that.     Relevant past medical, surgical, family and social history reviewed and updated as indicated. Interim medical history since our last visit reviewed. Allergies and medications reviewed and updated.   Current Outpatient Medications:  .  amLODipine (NORVASC) 10 MG tablet, Take 1 tablet (10 mg total) by mouth daily., Disp: 90 tablet, Rfl: 0 .  metoprolol tartrate (LOPRESSOR) 50 MG tablet, Take 1 tablet (50 mg total) by mouth 2 (two) times daily., Disp: 180 tablet, Rfl: 0    Review of Systems  Per HPI unless specifically indicated above     Objective:    LMP 04/03/2019   Wt Readings from Last 3 Encounters:  04/03/19 (!) 315 lb (142.9 kg)  04/01/19 (!) 310 lb (140.6 kg)  01/29/19 (!) 319 lb 10.7 oz (145 kg)     Physical Exam Constitutional:      General: She is not in acute distress.    Appearance: She is obese. She is not ill-appearing or toxic-appearing.  HENT:     Head: Normocephalic and atraumatic.  Pulmonary:     Effort: Pulmonary effort is normal. No respiratory distress.  Neurological:     Mental Status: She is alert and oriented to person, place, and time.  Psychiatric:        Attention and Perception: Attention normal.        Speech: Speech normal.        Behavior: Behavior is cooperative.         Assessment & Plan:    Encounter Diagnoses  Name Primary?  . Cyst of right breast Yes  . Essential hypertension, benign       -rx dicloxacillin for breast cyst.  Discussed imaging with pt but will Hold on imaging for now.  Sent to [facility]- [place].  (faxed with coupon) -pt to Get labs done -pt encouraged to Find bp machine -pt to F/u 2-3 wk.  Pt to call office for worsning

## 2019-05-03 ENCOUNTER — Other Ambulatory Visit: Payer: Self-pay | Admitting: Physician Assistant

## 2019-05-19 ENCOUNTER — Ambulatory Visit: Payer: Medicaid Other | Admitting: Physician Assistant

## 2019-05-27 ENCOUNTER — Other Ambulatory Visit (HOSPITAL_COMMUNITY): Payer: Self-pay | Admitting: Sports Medicine

## 2019-05-27 DIAGNOSIS — N63 Unspecified lump in unspecified breast: Secondary | ICD-10-CM

## 2019-05-27 DIAGNOSIS — Z1231 Encounter for screening mammogram for malignant neoplasm of breast: Secondary | ICD-10-CM

## 2019-06-07 ENCOUNTER — Ambulatory Visit (HOSPITAL_COMMUNITY): Payer: Medicaid Other

## 2019-06-07 ENCOUNTER — Encounter (HOSPITAL_COMMUNITY): Payer: Self-pay

## 2019-06-08 ENCOUNTER — Inpatient Hospital Stay (HOSPITAL_COMMUNITY): Admission: RE | Admit: 2019-06-08 | Payer: Medicaid Other | Source: Ambulatory Visit

## 2019-06-08 ENCOUNTER — Ambulatory Visit (HOSPITAL_COMMUNITY): Payer: Self-pay

## 2019-06-22 ENCOUNTER — Other Ambulatory Visit (HOSPITAL_COMMUNITY): Payer: Self-pay

## 2019-06-22 ENCOUNTER — Other Ambulatory Visit: Payer: Self-pay

## 2019-06-22 ENCOUNTER — Ambulatory Visit (HOSPITAL_COMMUNITY)
Admission: RE | Admit: 2019-06-22 | Discharge: 2019-06-22 | Disposition: A | Discharge status: Home / Self Care | Payer: Self-pay | Source: Ambulatory Visit | Attending: Sports Medicine | Admitting: Sports Medicine

## 2019-06-22 DIAGNOSIS — N63 Unspecified lump in unspecified breast: Secondary | ICD-10-CM

## 2019-08-26 ENCOUNTER — Ambulatory Visit: Payer: Medicaid Other | Attending: Internal Medicine

## 2019-09-20 ENCOUNTER — Emergency Department (HOSPITAL_COMMUNITY)
Admission: EM | Admit: 2019-09-20 | Discharge: 2019-09-20 | Disposition: A | Discharge status: Home / Self Care | Payer: Medicaid Other | Attending: Emergency Medicine | Admitting: Emergency Medicine

## 2019-09-20 ENCOUNTER — Encounter (HOSPITAL_COMMUNITY): Payer: Self-pay | Admitting: *Deleted

## 2019-09-20 ENCOUNTER — Other Ambulatory Visit: Payer: Self-pay

## 2019-09-20 DIAGNOSIS — F17210 Nicotine dependence, cigarettes, uncomplicated: Secondary | ICD-10-CM | POA: Insufficient documentation

## 2019-09-20 DIAGNOSIS — I10 Essential (primary) hypertension: Secondary | ICD-10-CM | POA: Insufficient documentation

## 2019-09-20 DIAGNOSIS — Z79899 Other long term (current) drug therapy: Secondary | ICD-10-CM | POA: Insufficient documentation

## 2019-09-20 DIAGNOSIS — Z8616 Personal history of COVID-19: Secondary | ICD-10-CM | POA: Insufficient documentation

## 2019-09-20 DIAGNOSIS — L03317 Cellulitis of buttock: Secondary | ICD-10-CM

## 2019-09-20 MED ORDER — DOXYCYCLINE HYCLATE 100 MG PO CAPS
100.0000 mg | ORAL_CAPSULE | Freq: Two times a day (BID) | ORAL | 0 refills | Status: AC
Start: 2019-09-20 — End: 2019-09-27

## 2019-09-20 NOTE — ED Provider Notes (Signed)
[facility] EMERGENCY DEPARTMENT Provider Note   CSN: [id number] Arrival date & time: 09/20/19  1041     History Chief Complaint  Patient presents with  . Insect Bite    Norma Nelson is a 34 y.o. female.  HPI   This patient is a 34 year old female, she has a history of eczema, history of a rash to her right upper buttock which occurred several days ago.  Seems to be gradually getting worse, tender, red, no fevers.  She comes to the hospital asking for evaluation.  Thinks it might have been a spider bite.,  Seems to be worse with palpation.  No fever  Past Medical History:  Diagnosis Date  . Bronchitis   . COVID-19   . Eczema   . Gonorrhea   . Hypertension   . Leukocytosis 2012   chronic  . Mental disorder    trouble sleeping,  . Papanicolaou smear of cervix with positive high risk human papilloma virus (HPV) test 09/09/2016  . Trichomonosis     Patient Active Problem List   Diagnosis Date Noted  . Papanicolaou smear of cervix with positive high risk human papilloma virus (HPV) test 09/09/2016  . Leukocytosis 10/19/2015  . Prediabetes 10/19/2015  . Essential hypertension, benign 09/14/2015  . Morbid obesity (HCC) 09/14/2015  . Cigarette nicotine dependence without complication 09/14/2015    History reviewed. No pertinent surgical history.   OB History    Gravida  0   Para  0   Term  0   Preterm  0   AB  0   Living  0     SAB  0   TAB  0   Ectopic  0   Multiple  0   Live Births  0           Family History  Problem Relation Age of Onset  . Seizures Mother   . Hypertension Mother   . Diabetes Mother   . Stroke Mother   . Heart disease Mother   . Cancer Mother        neuroendocrine cancer  . Hypertension Maternal Uncle   . Hypertension Maternal Uncle   . Hypertension Maternal Uncle   . Heart attack Maternal Uncle   . Hypertension Maternal Uncle     Social History   Tobacco Use  . Smoking status: Current Every Day Smoker   Packs/day: 0.50    Years: 13.00    Pack years: 6.50    Types: Cigarettes  . Smokeless tobacco: Never Used  Substance Use Topics  . Alcohol use: Yes    Comment: occ  . Drug use: No    Types: Opium    Home Medications Prior to Admission medications   Medication Sig Start Date End Date Taking? Authorizing Provider  amLODipine (NORVASC) 10 MG tablet Take 1 tablet by mouth once daily 05/03/19  Yes Jacquelin Hawking, PA-C  metoprolol tartrate (LOPRESSOR) 50 MG tablet Take 1 tablet by mouth twice daily 05/03/19  Yes Jacquelin Hawking, PA-C  dicloxacillin (DYNAPEN) 500 MG capsule Take 1 capsule (500 mg total) by mouth 4 (four) times daily. 04/27/19   Jacquelin Hawking, PA-C  doxycycline (VIBRAMYCIN) 100 MG capsule Take 1 capsule (100 mg total) by mouth 2 (two) times daily for 7 days. 09/20/19 09/27/19  Eber Hong, MD    Allergies    Hydrocodone, Lisinopril, Naproxen, Shellfish allergy, and Tramadol  Review of Systems   Review of Systems  Constitutional: Negative for fever.  Skin: Positive for  rash.    Physical Exam Updated Vital Signs BP (!) 156/88 (BP Location: Right Arm)   Pulse 68   Temp 98.4 F (36.9 C) (Oral)   Resp 16   Ht 1.803 m (5\' 11" )   Wt 136.1 kg   SpO2 100%   BMI 41.84 kg/m   Physical Exam Vitals and nursing note reviewed.  Constitutional:      Appearance: She is well-developed. She is not diaphoretic.  HENT:     Head: Normocephalic and atraumatic.  Eyes:     General:        Right eye: No discharge.        Left eye: No discharge.     Conjunctiva/sclera: Conjunctivae normal.  Pulmonary:     Effort: Pulmonary effort is normal. No respiratory distress.  Genitourinary:    Comments: Chaperone present for exam, the upper part of the right buttock approximately 1 inch lateral to the gluteal cleft has an area approximately 2 cm in diameter which is erythematous, mildly indurated and very superficial without an area of fluctuance. Skin:    General: Skin is warm and  dry.     Findings: Rash present. No erythema.  Neurological:     Mental Status: She is alert.     Coordination: Coordination normal.     ED Results / Procedures / Treatments   Labs (all labs ordered are listed, but only abnormal results are displayed) Labs Reviewed - No data to display  EKG None  Radiology No results found.  Procedures Procedures (including critical care time)  Medications Ordered in ED Medications - No data to display  ED Course  I have reviewed the triage vital signs and the nursing notes.  Pertinent labs & imaging results that were available during my care of the patient were reviewed by me and considered in my medical decision making (see chart for details).    MDM Rules/Calculators/A&P                      Cellulitis or early abscess, this is very small and at this time does not necessitate or warranted drainage.  Encouraged hot soaks and antibiotics, patient agreeable.  Stable for discharge, no signs of sepsis  Final Clinical Impression(s) / ED Diagnoses Final diagnoses:  Cellulitis of buttock    Rx / DC Orders ED Discharge Orders         Ordered    doxycycline (VIBRAMYCIN) 100 MG capsule  2 times daily     09/20/19 1103           Noemi Chapel, MD 09/20/19 1106

## 2019-09-20 NOTE — ED Triage Notes (Signed)
Pt states she has a spider bite to top of her buttocks; pt denies any drainage just states it has increased in size over the last 3 days

## 2019-09-20 NOTE — ED Notes (Signed)
ED Provider at bedside. 

## 2019-09-20 NOTE — Discharge Instructions (Signed)
Please see the list of family doctors below for close follow-up within 2 to 3 days for recheck  You have an infection in the top of your buttock, this looks like a staph infection and may have started because of an insect bite or an ingrown hair.  At this time it is hard to the touch and tender but it does not appear to have a pus center.  You should do warm soaks in a hot tub twice a day for at least 20 to 30 minutes, take doxycycline twice a day for the next week.  However, if you develop increasing pain, redness, fever, swelling or any other severe or worsening symptoms return to the emergency department immediately.  Please see your doctor in 2 to 3 days for recheck.  [facility] Primary Care Doctor List    Kari Baars MD. Specialty: Pulmonary Disease Contact information: [address]  [address]  [place] [address]  [phone]   Syliva Overman, MD. Specialty: [facility] Medicine Contact information: [address]  [place] [address]  [phone]   Lilyan Punt, MD. Specialty: [facility] Medicine Contact information: [address]  [place] [address]  [phone]   Avon Gully, MD Specialty: Internal Medicine Contact information: [address] [place] [address]  [phone]   Catalina Pizza, MD. Specialty: Internal Medicine Contact information: [address]  [place] [address]  [phone]    [facility] (Dr. Selena Batten) Specialty: Family Medicine Contact information: [address]  [place] [address]  [phone]   John Giovanni, MD. Specialty: [facility] Medicine Contact information: [address]  [address]  [place] [address]  [phone]   Carylon Perches, MD. Specialty: Internal Medicine Contact information: [address]  [address]  [place] [address]  [phone]    [facility]  [address] [place], [place] [address] [phone]  Services The  [facility] offers a variety of basic health services.  Services include but are not limited to: Blood pressure checks  Heart rate checks  Blood sugar checks  Urine analysis  Rapid strep tests  Pregnancy tests.  Health education and referrals  People needing more complex services will be directed to a physician online. Using these virtual visits, doctors can evaluate and prescribe medicine and treatments. There will be no medication on-site, though [facility] will help patients fill their prescriptions at little to no cost.   For More information please go to: [email or web address]

## 2020-01-14 ENCOUNTER — Encounter: Payer: Self-pay | Admitting: Emergency Medicine

## 2020-01-14 ENCOUNTER — Other Ambulatory Visit: Payer: Self-pay

## 2020-01-14 ENCOUNTER — Ambulatory Visit
Admission: EM | Admit: 2020-01-14 | Discharge: 2020-01-14 | Disposition: A | Discharge status: Home / Self Care | Payer: Self-pay | Attending: Emergency Medicine | Admitting: Emergency Medicine

## 2020-01-14 DIAGNOSIS — L309 Dermatitis, unspecified: Secondary | ICD-10-CM

## 2020-01-14 MED ORDER — DEXAMETHASONE SODIUM PHOSPHATE 10 MG/ML IJ SOLN
10.0000 mg | Freq: Once | INTRAMUSCULAR | Status: AC
  Administered 2020-01-14: 10 mg via INTRAMUSCULAR

## 2020-01-14 MED ORDER — TRIAMCINOLONE ACETONIDE 0.1 % EX OINT: 1.0000 "application " | TOPICAL_OINTMENT | Freq: Two times a day (BID) | CUTANEOUS | 1 refills | Status: DC

## 2020-01-14 MED ORDER — TRIAMCINOLONE ACETONIDE 0.1 % EX CREA
1.0000 | TOPICAL_CREAM | Freq: Two times a day (BID) | CUTANEOUS | 0 refills | Status: DC
Start: 2020-01-14 — End: 2020-01-14

## 2020-01-14 MED ORDER — PREDNISONE 10 MG PO TABS
20.0000 mg | ORAL_TABLET | Freq: Every day | ORAL | 0 refills | Status: AC
Start: 2020-01-14 — End: 2020-01-19

## 2020-01-14 NOTE — ED Provider Notes (Signed)
[facility] CARE [facility]ENTER   [id number] 01/14/20 Arrival Time: 1303   Chief Complaint  Patient presents with   Rash    SUBJECTIVE: History from: patient.  Norma Nelson is a 34 y.o. female with history of eczema  presents the urgent care with a complaint of rash. Denies changes in soaps, detergents, or anyone with similar symptoms.  She localizes the rash to her arm, abdomen and lower extremities.  She describes it as red and itchy.  She has tried triamcinolone cream with relief.  Report triamcinolone cream run out.  Denies aggravating factors.  She reports similar symptoms in the past that improved with steroid cream.  Denies chills, fever, nausea, vomiting, diarrhea..    ROS: As per HPI.  All other pertinent ROS negative.     Past Medical History:  Diagnosis Date   Bronchitis    COVID-19    Eczema    Gonorrhea    Hypertension    Leukocytosis 2012   chronic   Mental disorder    trouble sleeping,   Papanicolaou smear of cervix with positive high risk human papilloma virus (HPV) test 09/09/2016   Trichomonosis    History reviewed. No pertinent surgical history. Allergies  Allergen Reactions   Hydrocodone Nausea And Vomiting   Lisinopril Other (See Comments)    "takes all fluid off her body"   Naproxen Nausea And Vomiting   Shellfish Allergy Itching   Tramadol     "bleed"   No current facility-administered medications on file prior to encounter.   Current Outpatient Medications on File Prior to Encounter  Medication Sig Dispense Refill   amLODipine (NORVASC) 10 MG tablet Take 1 tablet by mouth once daily 30 tablet 0   dicloxacillin (DYNAPEN) 500 MG capsule Take 1 capsule (500 mg total) by mouth 4 (four) times daily. 40 capsule 0   metoprolol tartrate (LOPRESSOR) 50 MG tablet Take 1 tablet by mouth twice daily 60 tablet 0   Social History   Socioeconomic History   Marital status: Single    Spouse name: Not on file   Number of children: Not on  file   Years of education: Not on file   Highest education level: Not on file  Occupational History   Not on file  Tobacco Use   Smoking status: Current Every Day Smoker    Packs/day: 0.50    Years: 13.00    Pack years: 6.50    Types: Cigarettes   Smokeless tobacco: Never Used  Vaping Use   Vaping Use: Never used  Substance and Sexual Activity   Alcohol use: Yes    Comment: occ   Drug use: No    Types: Opium   Sexual activity: Yes    Birth control/protection: None    Comment: partner is female  Other Topics Concern   Not on file  Social History Narrative   Not on file   Social Determinants of Health   Financial Resource Strain:    Difficulty of Paying Living Expenses: Not on file  Food Insecurity:    Worried About Programme researcher, broadcasting/film/video in the Last Year: Not on file   [employer] of Food in the Last Year: Not on file  Transportation Needs:    Lack of Transportation (Medical): Not on file   Lack of Transportation (Non-Medical): Not on file  Physical Activity:    Days of Exercise per Week: Not on file   Minutes of Exercise per Session: Not on file  Stress:  Feeling of Stress : Not on file  Social Connections:    Frequency of Communication with Friends and Family: Not on file   Frequency of Social Gatherings with Friends and Family: Not on file   Attends Religious Services: Not on file   Active Member of Clubs or Organizations: Not on file   Attends Banker Meetings: Not on file   Marital Status: Not on file  Intimate Partner Violence:    Fear of Current or Ex-Partner: Not on file   Emotionally Abused: Not on file   Physically Abused: Not on file   Sexually Abused: Not on file   Family History  Problem Relation Age of Onset   Seizures Mother    Hypertension Mother    Diabetes Mother    Stroke Mother    Heart disease Mother    Cancer Mother        neuroendocrine cancer   Hypertension Maternal Uncle     Hypertension Maternal Uncle    Hypertension Maternal Uncle    Heart attack Maternal Uncle    Hypertension Maternal Uncle     OBJECTIVE:  Vitals:   01/14/20 1320  BP: 133/81  Pulse: 65  Resp: 16  Temp: 98.6 F (37 C)  SpO2: 98%     Physical Exam Vitals and nursing note reviewed.  Constitutional:      General: She is not in acute distress.    Appearance: Normal appearance. She is normal weight. She is not ill-appearing, toxic-appearing or diaphoretic.  HENT:     Head: Normocephalic.  Cardiovascular:     Rate and Rhythm: Normal rate and regular rhythm.     Pulses: Normal pulses.     Heart sounds: Normal heart sounds. No murmur heard.  No friction rub. No gallop.   Pulmonary:     Effort: Pulmonary effort is normal. No respiratory distress.     Breath sounds: Normal breath sounds. No stridor. No wheezing, rhonchi or rales.  Chest:     Chest wall: No tenderness.  Skin:    General: Skin is warm.     Capillary Refill: Capillary refill takes less than 2 seconds.     Findings: Rash present. Rash is scaling.  Neurological:     Mental Status: She is alert and oriented to person, place, and time.    LABS:  No results found for this or any previous visit (from the past 24 hour(s)).   ASSESSMENT & PLAN:  1. Chronic eczema     Meds ordered this encounter  Medications   triamcinolone cream (KENALOG) 0.1 %    Sig: Apply 1 application topically 2 (two) times daily.    Dispense:  30 g    Refill:  0   predniSONE (DELTASONE) 10 MG tablet    Sig: Take 2 tablets (20 mg total) by mouth daily for 5 days.    Dispense:  10 tablet    Refill:  0   Discharge instructions  Wash with warm water and mild soap Avoid hot showers or baths Moisturize skin daily Triamcinolone cream was prescribed.  Avoid using greater than 2 weeks on face or flexural areas which may be prone to skin thinning Decadron IM was given in office Low-dose steroid was prescribed Return or go to the ED if  you have any new or worsen symptoms such as fever, chills, nausea, vomiting, redness, swelling, discharge, oral lesions, shortness of breath, chest pain, abdominal pain, changes in bowel or bladder function, etc..   Reviewed expectations  re: course of current medical issues. Questions answered. Outlined signs and symptoms indicating need for more acute intervention. Patient verbalized understanding. After Visit Summary given.          Durward Parcel, FNP 01/14/20 1406

## 2020-01-14 NOTE — ED Triage Notes (Signed)
eczema flare up- needs refill on steroid cream and would like a steriod shot also.

## 2020-01-14 NOTE — Discharge Instructions (Addendum)
Wash with warm water and mild soap Avoid hot showers or baths Moisturize skin daily Triamcinolone cream was prescribed.  Avoid using greater than 2 weeks on face or flexural areas which may be prone to skin thinning Decadron IM was given in office Low-dose steroid was prescribed Return or go to the ED if you have any new or worsen symptoms such as fever, chills, nausea, vomiting, redness, swelling, discharge, oral lesions, shortness of breath, chest pain, abdominal pain, changes in bowel or bladder function, etc..

## 2020-01-26 ENCOUNTER — Other Ambulatory Visit: Payer: Self-pay

## 2020-01-26 ENCOUNTER — Encounter (HOSPITAL_COMMUNITY): Payer: Self-pay | Admitting: Emergency Medicine

## 2020-01-26 ENCOUNTER — Emergency Department (HOSPITAL_COMMUNITY)
Admission: EM | Admit: 2020-01-26 | Discharge: 2020-01-26 | Disposition: A | Discharge status: Home / Self Care | Payer: Medicaid Other | Attending: Emergency Medicine | Admitting: Emergency Medicine

## 2020-01-26 DIAGNOSIS — F17210 Nicotine dependence, cigarettes, uncomplicated: Secondary | ICD-10-CM | POA: Insufficient documentation

## 2020-01-26 DIAGNOSIS — Z8616 Personal history of COVID-19: Secondary | ICD-10-CM | POA: Insufficient documentation

## 2020-01-26 DIAGNOSIS — K2970 Gastritis, unspecified, without bleeding: Secondary | ICD-10-CM

## 2020-01-26 DIAGNOSIS — Z79899 Other long term (current) drug therapy: Secondary | ICD-10-CM | POA: Insufficient documentation

## 2020-01-26 DIAGNOSIS — Z20822 Contact with and (suspected) exposure to covid-19: Secondary | ICD-10-CM | POA: Insufficient documentation

## 2020-01-26 DIAGNOSIS — I10 Essential (primary) hypertension: Secondary | ICD-10-CM | POA: Insufficient documentation

## 2020-01-26 DIAGNOSIS — A084 Viral intestinal infection, unspecified: Secondary | ICD-10-CM | POA: Insufficient documentation

## 2020-01-26 LAB — URINALYSIS, ROUTINE W REFLEX MICROSCOPIC

## 2020-01-26 LAB — URINALYSIS, MICROSCOPIC (REFLEX): RBC / HPF: >50 RBC/hpf (ref 0–5)

## 2020-01-26 LAB — RESPIRATORY PANEL BY RT PCR (FLU A&B, COVID)
Influenza A by PCR: NEGATIVE
Influenza B by PCR: NEGATIVE
SARS Coronavirus 2 by RT PCR: NEGATIVE

## 2020-01-26 LAB — POC URINE PREG, ED: Preg Test, Ur: NEGATIVE

## 2020-01-26 MED ORDER — ONDANSETRON 8 MG PO TBDP: 8.0000 mg | ORAL_TABLET | Freq: Once | ORAL | Status: DC

## 2020-01-26 MED ORDER — ONDANSETRON 4 MG PO TBDP
4.0000 mg | ORAL_TABLET | Freq: Three times a day (TID) | ORAL | 0 refills | Status: DC | PRN
Start: 2020-01-26 — End: 2020-12-19

## 2020-01-26 MED ORDER — ONDANSETRON 4 MG PO TBDP
4.0000 mg | ORAL_TABLET | Freq: Once | ORAL | Status: AC
  Administered 2020-01-26: 4 mg via ORAL
  Filled 2020-01-26: qty 1

## 2020-01-26 NOTE — ED Provider Notes (Signed)
[facility] EMERGENCY DEPARTMENT Provider Note  CSN: [id number] Arrival date & time: 01/26/20 0818    History Chief Complaint  Patient presents with  . Emesis    HPI  Norma Nelson is a 34 y.o. female with no significant PMH reports she began feeling nauseated after work yesterday afternoon and has 7-8 episodes of non bloody emesis since then. She has some mild upper abdominal discomfort, but no diarrhea. She denies, fever, cough or SOB. She reports multiple co-workers have been sick, unsure if they have Covid, but she has personally had Covid twice in the last year. Has not been vaccinated. She reports she recently started her menstrual cycle, does not think she's pregnant.    Past Medical History:  Diagnosis Date  . Bronchitis   . COVID-19   . Eczema   . Gonorrhea   . Hypertension   . Leukocytosis 2012   chronic  . Mental disorder    trouble sleeping,  . Papanicolaou smear of cervix with positive high risk human papilloma virus (HPV) test 09/09/2016  . Trichomonosis     History reviewed. No pertinent surgical history.  Family History  Problem Relation Age of Onset  . Seizures Mother   . Hypertension Mother   . Diabetes Mother   . Stroke Mother   . Heart disease Mother   . Cancer Mother        neuroendocrine cancer  . Hypertension Maternal Uncle   . Hypertension Maternal Uncle   . Hypertension Maternal Uncle   . Heart attack Maternal Uncle   . Hypertension Maternal Uncle     Social History   Tobacco Use  . Smoking status: Current Every Day Smoker    Packs/day: 0.50    Years: 13.00    Pack years: 6.50    Types: Cigarettes  . Smokeless tobacco: Never Used  Vaping Use  . Vaping Use: Never used  Substance Use Topics  . Alcohol use: Yes    Comment: occ  . Drug use: No    Types: Opium     Home Medications Prior to Admission medications   Medication Sig Start Date End Date Taking? Authorizing Provider  amLODipine (NORVASC) 10 MG tablet Take 1  tablet by mouth once daily Patient taking differently: Take 10 mg by mouth daily.  05/03/19  Yes Jacquelin Hawking, PA-C  metoprolol tartrate (LOPRESSOR) 50 MG tablet Take 1 tablet by mouth twice daily Patient taking differently: Take 50 mg by mouth 2 (two) times daily.  05/03/19  Yes Jacquelin Hawking, PA-C  triamcinolone ointment (KENALOG) 0.1 % Apply 1 application topically 2 (two) times daily. 01/14/20  Yes Avegno, Zachery Dakins, FNP  dicloxacillin (DYNAPEN) 500 MG capsule Take 1 capsule (500 mg total) by mouth 4 (four) times daily. Patient not taking: Reported on 01/26/2020 04/27/19   Jacquelin Hawking, PA-C  ondansetron (ZOFRAN ODT) 4 MG disintegrating tablet Take 1 tablet (4 mg total) by mouth every 8 (eight) hours as needed for nausea or vomiting. 01/26/20   Pollyann Savoy, MD     Allergies    Amoxicillin, Hydrocodone, Lisinopril, Naproxen, Shellfish allergy, and Tramadol   Review of Systems   Review of Systems A comprehensive review of systems was completed and negative except as noted in HPI.    Physical Exam BP (!) 164/88 (BP Location: Right Arm)   Pulse 64   Temp 98.4 F (36.9 C) (Oral)   Resp 16   Ht 5\' 11"  (1.803 m)   Wt 135.2 kg  LMP 01/26/2020 (Exact Date)   SpO2 100%   BMI 41.56 kg/m   Physical Exam Vitals and nursing note reviewed.  Constitutional:      Appearance: Normal appearance.  HENT:     Head: Normocephalic and atraumatic.     Nose: Nose normal.     Mouth/Throat:     Mouth: Mucous membranes are moist.  Eyes:     Extraocular Movements: Extraocular movements intact.     Conjunctiva/sclera: Conjunctivae normal.  Cardiovascular:     Rate and Rhythm: Normal rate.  Pulmonary:     Effort: Pulmonary effort is normal.     Breath sounds: Normal breath sounds.  Abdominal:     General: Abdomen is flat. There is no distension.     Palpations: Abdomen is soft.     Tenderness: There is no abdominal tenderness. There is no guarding.  Musculoskeletal:         General: No swelling. Normal range of motion.     Cervical back: Neck supple.  Skin:    General: Skin is warm and dry.  Neurological:     General: No focal deficit present.     Mental Status: She is alert.  Psychiatric:        Mood and Affect: Mood normal.      ED Results / Procedures / Treatments   Labs (all labs ordered are listed, but only abnormal results are displayed) Labs Reviewed  URINALYSIS, ROUTINE W REFLEX MICROSCOPIC - Abnormal; Notable for the following components:      Result Value   Color, Urine RED (*)    APPearance TURBID (*)    Glucose, UA   (*)    Value: TEST NOT REPORTED DUE TO COLOR INTERFERENCE OF URINE PIGMENT   Hgb urine dipstick   (*)    Value: TEST NOT REPORTED DUE TO COLOR INTERFERENCE OF URINE PIGMENT   Bilirubin Urine   (*)    Value: TEST NOT REPORTED DUE TO COLOR INTERFERENCE OF URINE PIGMENT   Ketones, ur   (*)    Value: TEST NOT REPORTED DUE TO COLOR INTERFERENCE OF URINE PIGMENT   Protein, ur   (*)    Value: TEST NOT REPORTED DUE TO COLOR INTERFERENCE OF URINE PIGMENT   Nitrite   (*)    Value: TEST NOT REPORTED DUE TO COLOR INTERFERENCE OF URINE PIGMENT   Leukocytes,Ua   (*)    Value: TEST NOT REPORTED DUE TO COLOR INTERFERENCE OF URINE PIGMENT   All other components within normal limits  URINALYSIS, MICROSCOPIC (REFLEX) - Abnormal; Notable for the following components:   Bacteria, UA RARE (*)    Crystals   (*)    Value: TEST NOT REPORTED DUE TO COLOR INTERFERENCE OF URINE PIGMENT   All other components within normal limits  RESPIRATORY PANEL BY RT PCR (FLU A&B, COVID)  POC URINE PREG, ED    EKG None  Radiology No results found.  Procedures Procedures  Medications Ordered in the ED Medications  ondansetron (ZOFRAN-ODT) disintegrating tablet 4 mg (4 mg Oral Given 01/26/20 5916)     MDM Rules/Calculators/A&P MDM Patient with nausea and vomiting, benign abdomen. Concerned for Covid or Flu, will check UA and pregnancy, Zofran  ODT and Covid swab.  ED Course  I have reviewed the triage vital signs and the nursing notes.  Pertinent labs & imaging results that were available during my care of the patient were reviewed by me and considered in my medical decision making (see chart for details).  Clinical Course as of Jan 25 1049  Wed Jan 26, 2020  1033 Preg is neg, Covid/flu neg. UA contaminated by blood.    [CS]  1048 Patient resting comfortably, looking at her phone. She has not vomited since arrival. Plan discharge with Rx for Zofran, she is also requesting a work note.    [CS]    Clinical Course User Index [CS] Pollyann Savoy, MD    Final Clinical Impression(s) / ED Diagnoses Final diagnoses:  Viral gastritis    Rx / DC Orders ED Discharge Orders         Ordered    ondansetron (ZOFRAN ODT) 4 MG disintegrating tablet  Every 8 hours PRN        01/26/20 1049           Pollyann Savoy, MD 01/26/20 1050

## 2020-01-26 NOTE — ED Triage Notes (Signed)
Pt c/o vomiting and nausea x 2 days.

## 2020-04-15 ENCOUNTER — Emergency Department (HOSPITAL_COMMUNITY)
Admission: EM | Admit: 2020-04-15 | Discharge: 2020-04-15 | Disposition: A | Discharge status: Home / Self Care | Payer: HRSA Program | Attending: Emergency Medicine | Admitting: Emergency Medicine

## 2020-04-15 DIAGNOSIS — I10 Essential (primary) hypertension: Secondary | ICD-10-CM | POA: Diagnosis not present

## 2020-04-15 DIAGNOSIS — Z79899 Other long term (current) drug therapy: Secondary | ICD-10-CM | POA: Insufficient documentation

## 2020-04-15 DIAGNOSIS — Z20822 Contact with and (suspected) exposure to covid-19: Secondary | ICD-10-CM

## 2020-04-15 DIAGNOSIS — R430 Anosmia: Secondary | ICD-10-CM

## 2020-04-15 DIAGNOSIS — F17210 Nicotine dependence, cigarettes, uncomplicated: Secondary | ICD-10-CM | POA: Insufficient documentation

## 2020-04-15 DIAGNOSIS — U071 COVID-19: Secondary | ICD-10-CM | POA: Diagnosis not present

## 2020-04-15 LAB — RESP PANEL BY RT-PCR (FLU A&B, COVID) ARPGX2
Influenza A by PCR: NEGATIVE
Influenza B by PCR: NEGATIVE
SARS Coronavirus 2 by RT PCR: NEGATIVE

## 2020-04-15 MED ORDER — FLUTICASONE PROPIONATE 50 MCG/ACT NA SUSP
2.0000 | Freq: Every day | NASAL | 0 refills | Status: DC
Start: 2020-04-15 — End: 2022-05-17

## 2020-04-15 NOTE — ED Provider Notes (Signed)
[facility] EMERGENCY DEPARTMENT Provider Note   CSN: [id number] Arrival date & time: 04/15/20  1411     History Chief Complaint  Patient presents with  . Covid Exposure         Norma Nelson is a 34 y.o. female with past medical history significant for hypertension, chronic leukocytosis who presents for evaluation of loss of taste and smell.  Woke up yesterday morning with congestion, clear rhinorrhea and loss of taste and smell.  She has not vaccinations COVID.  No known Covid exposures.  No one else around her has been sick.  She denies any headache, lightness, dizziness, sore throat, neck pain, neck stiffness, chest pain, shortness of breath abdominal pain, diarrhea, dysuria, lateral leg swelling, redness, warmth.  Does not take anything for her symptoms.  Denies additional aggravating or alleviating factors.  History obtained from patient and past medical records. No interpreter used  HPI     Past Medical History:  Diagnosis Date  . Bronchitis   . COVID-19   . Eczema   . Gonorrhea   . Hypertension   . Leukocytosis 2012   chronic  . Mental disorder    trouble sleeping,  . Papanicolaou smear of cervix with positive high risk human papilloma virus (HPV) test 09/09/2016  . Trichomonosis     Patient Active Problem List   Diagnosis Date Noted  . Papanicolaou smear of cervix with positive high risk human papilloma virus (HPV) test 09/09/2016  . Leukocytosis 10/19/2015  . Prediabetes 10/19/2015  . Essential hypertension, benign 09/14/2015  . Morbid obesity (HCC) 09/14/2015  . Cigarette nicotine dependence without complication 09/14/2015    No past surgical history on file.   OB History    Gravida  0   Para  0   Term  0   Preterm  0   AB  0   Living  0     SAB  0   IAB  0   Ectopic  0   Multiple  0   Live Births  0           Family History  Problem Relation Age of Onset  . Seizures Mother   . Hypertension Mother   . Diabetes Mother    . Stroke Mother   . Heart disease Mother   . Cancer Mother        neuroendocrine cancer  . Hypertension Maternal Uncle   . Hypertension Maternal Uncle   . Hypertension Maternal Uncle   . Heart attack Maternal Uncle   . Hypertension Maternal Uncle     Social History   Tobacco Use  . Smoking status: Current Every Day Smoker    Packs/day: 0.50    Years: 13.00    Pack years: 6.50    Types: Cigarettes  . Smokeless tobacco: Never Used  Vaping Use  . Vaping Use: Never used  Substance Use Topics  . Alcohol use: Yes    Comment: occ  . Drug use: No    Types: Opium    Home Medications Prior to Admission medications   Medication Sig Start Date End Date Taking? Authorizing Provider  amLODipine (NORVASC) 10 MG tablet Take 1 tablet by mouth once daily Patient taking differently: Take 10 mg by mouth daily.  05/03/19   Jacquelin Hawking, PA-C  dicloxacillin (DYNAPEN) 500 MG capsule Take 1 capsule (500 mg total) by mouth 4 (four) times daily. Patient not taking: Reported on 01/26/2020 04/27/19   Jacquelin Hawking, PA-C  fluticasone [facility])  50 MCG/ACT nasal spray Place 2 sprays into both nostrils daily. 04/15/20   Ermal Brzozowski A, PA-C  metoprolol tartrate (LOPRESSOR) 50 MG tablet Take 1 tablet by mouth twice daily Patient taking differently: Take 50 mg by mouth 2 (two) times daily.  05/03/19   Jacquelin Hawking, PA-C  ondansetron (ZOFRAN ODT) 4 MG disintegrating tablet Take 1 tablet (4 mg total) by mouth every 8 (eight) hours as needed for nausea or vomiting. 01/26/20   Pollyann Savoy, MD  triamcinolone ointment (KENALOG) 0.1 % Apply 1 application topically 2 (two) times daily. 01/14/20   Avegno, Zachery Dakins, FNP    Allergies    Amoxicillin, Hydrocodone, Lisinopril, Naproxen, Shellfish allergy, and Tramadol  Review of Systems   Review of Systems  Constitutional: Negative.   HENT: Positive for congestion, postnasal drip and rhinorrhea. Negative for sinus pressure, sinus pain, sneezing,  sore throat, trouble swallowing and voice change.        Loss of taste and smell  Respiratory: Negative.   Cardiovascular: Negative.   Gastrointestinal: Negative.   Genitourinary: Negative.   Musculoskeletal: Negative.   Skin: Negative.   Neurological: Negative.   All other systems reviewed and are negative.   Physical Exam Updated Vital Signs BP 138/86   Pulse 85   Temp 98.1 F (36.7 C)   Resp 20   SpO2 93%   Physical Exam Vitals and nursing note reviewed.  Constitutional:      General: She is not in acute distress.    Appearance: She is not ill-appearing, toxic-appearing or diaphoretic.  HENT:     Head: Normocephalic and atraumatic.     Jaw: There is normal jaw occlusion.     Right Ear: Tympanic membrane, ear canal and external ear normal. There is no impacted cerumen. No hemotympanum. Tympanic membrane is not injected, scarred, perforated, erythematous, retracted or bulging.     Left Ear: Tympanic membrane, ear canal and external ear normal. There is no impacted cerumen. No hemotympanum. Tympanic membrane is not injected, scarred, perforated, erythematous, retracted or bulging.     Ears:     Comments: No Mastoid tenderness.    Nose:     Comments: Clear rhinorrhea and congestion to bilateral nares.  No sinus tenderness.    Mouth/Throat:     Comments: Posterior oropharynx clear.  Mucous membranes moist.  Tonsils without erythema or exudate.  Uvula midline without deviation.  No evidence of PTA or RPA.  No drooling, dysphasia or trismus.  Phonation normal. Neck:     Trachea: Trachea and phonation normal.     Comments: No Neck stiffness or neck rigidity.  No meningismus.  No cervical lymphadenopathy. Cardiovascular:     Comments: No murmurs rubs or gallops. Pulmonary:     Comments: Clear to auscultation bilaterally without wheeze, rhonchi or rales.  No accessory muscle usage.  Able speak in full sentences. Abdominal:     Comments: Soft, nontender without rebound or  guarding.  No CVA tenderness.  Musculoskeletal:     Cervical back: Full passive range of motion without pain and normal range of motion.     Comments: Moves all 4 extremities without difficulty.  Lower extremities without edema, erythema or warmth.  Skin:    Comments: Brisk capillary refill.  No rashes or lesions.  Neurological:     Mental Status: She is alert.     Comments: Ambulatory in department without difficulty.  Cranial nerves II through XII grossly intact.  No facial droop.  No aphasia.  ED Results / Procedures / Treatments   Labs (all labs ordered are listed, but only abnormal results are displayed) Labs Reviewed  RESP PANEL BY RT-PCR (FLU A&B, COVID) [id number]    EKG None  Radiology No results found.  Procedures Procedures (including critical care time)  Medications Ordered in ED Medications - No data to display  ED Course  I have reviewed the triage vital signs and the nursing notes.  Pertinent labs & imaging results that were available during my care of the patient were reviewed by me and considered in my medical decision making (see chart for details).  34 year old, otherwise well presents for evaluation of loss of taste and smell.  She is afebrile, nonseptic, non-ill-appearing.  Associated congestion and clear rhinorrhea which began yesterday.  She is not vaccine against COVID.  No chest pain, shortness of breath.  She is tolerating p.o. intake.  Heart and lungs clear.  Abdomen soft, nontender.  No lower leg swelling, redness or warmth.  Appears clinically well-hydrated.  No facial tenderness.  No neck stiffness or neck rigidity.  No meningismus.  Will obtain Covid test.  Patient will follow up on results on MyChart.  Discussed if Covid test is negative likely viral URI.  She will return for any worsening symptoms.  The patient has been appropriately medically screened and/or stabilized in the ED. I have low suspicion for any other emergent medical condition which  would require further screening, evaluation or treatment in the ED or require inpatient management.  Patient is hemodynamically stable and in no acute distress.  Patient able to ambulate in department prior to ED.  Evaluation does not show acute pathology that would require ongoing or additional emergent interventions while in the emergency department or further inpatient treatment.  I have discussed the diagnosis with the patient and answered all questions.  Pain is been managed while in the emergency department and patient has no further complaints prior to discharge.  Patient is comfortable with plan discussed in room and is stable for discharge at this time.  I have discussed strict return precautions for returning to the emergency department.  Patient was encouraged to follow-up with PCP/specialist refer to at discharge.    MDM Rules/Calculators/A&P                          Norma Nelson was evaluated in Emergency Department on 04/15/2020 for the symptoms described in the history of present illness. She was evaluated in the context of the global COVID-19 pandemic, which necessitated consideration that the patient might be at risk for infection with the SARS-CoV-2 virus that causes COVID-19. Institutional protocols and algorithms that pertain to the evaluation of patients at risk for COVID-19 are in a state of rapid change based on information released by regulatory bodies including the CDC and federal and state organizations. These policies and algorithms were followed during the patient's care in the ED. Final Clinical Impression(s) / ED Diagnoses Final diagnoses:  Anosmia  Person under investigation for COVID-19    Rx / DC Orders ED Discharge Orders         Ordered    fluticasone (FLONASE) 50 MCG/ACT nasal spray  Daily        04/15/20 1523           Enisa Runyan A, PA-C 04/15/20 1528    Sabas Sous, MD 04/15/20 2335

## 2020-04-15 NOTE — ED Triage Notes (Signed)
Possible covid 

## 2020-04-15 NOTE — Discharge Instructions (Signed)
Your Covid result will be on your MyChart account.  If positive you will need to be out of work for 10 days.  I have written a work note if you are positive.

## 2020-11-17 ENCOUNTER — Other Ambulatory Visit: Payer: Self-pay

## 2020-11-17 ENCOUNTER — Emergency Department (HOSPITAL_COMMUNITY): Payer: Self-pay

## 2020-11-17 ENCOUNTER — Encounter (HOSPITAL_COMMUNITY): Payer: Self-pay | Admitting: *Deleted

## 2020-11-17 ENCOUNTER — Emergency Department (HOSPITAL_COMMUNITY)
Admission: EM | Admit: 2020-11-17 | Discharge: 2020-11-17 | Disposition: A | Discharge status: Home / Self Care | Payer: Self-pay | Attending: Emergency Medicine | Admitting: Emergency Medicine

## 2020-11-17 DIAGNOSIS — Z8616 Personal history of COVID-19: Secondary | ICD-10-CM | POA: Insufficient documentation

## 2020-11-17 DIAGNOSIS — F17210 Nicotine dependence, cigarettes, uncomplicated: Secondary | ICD-10-CM | POA: Insufficient documentation

## 2020-11-17 DIAGNOSIS — Z79899 Other long term (current) drug therapy: Secondary | ICD-10-CM | POA: Insufficient documentation

## 2020-11-17 DIAGNOSIS — N200 Calculus of kidney: Secondary | ICD-10-CM | POA: Insufficient documentation

## 2020-11-17 DIAGNOSIS — I10 Essential (primary) hypertension: Secondary | ICD-10-CM | POA: Insufficient documentation

## 2020-11-17 DIAGNOSIS — X500XXA Overexertion from strenuous movement or load, initial encounter: Secondary | ICD-10-CM | POA: Insufficient documentation

## 2020-11-17 DIAGNOSIS — S39012A Strain of muscle, fascia and tendon of lower back, initial encounter: Secondary | ICD-10-CM | POA: Insufficient documentation

## 2020-11-17 LAB — DIFFERENTIAL
Abs Immature Granulocytes: 0.13 10*3/uL — ABNORMAL HIGH (ref 0.00–0.07)
Basophils Absolute: 0.1 10*3/uL (ref 0.0–0.1)
Basophils Relative: 1 %
Eosinophils Absolute: 0.7 10*3/uL — ABNORMAL HIGH (ref 0.0–0.5)
Eosinophils Relative: 4 %
Immature Granulocytes: 1 %
Lymphocytes Relative: 22 %
Lymphs Abs: 3.9 10*3/uL (ref 0.7–4.0)
Monocytes Absolute: 1.9 10*3/uL — ABNORMAL HIGH (ref 0.1–1.0)
Monocytes Relative: 11 %
Neutro Abs: 11.1 10*3/uL — ABNORMAL HIGH (ref 1.7–7.7)
Neutrophils Relative %: 61 %

## 2020-11-17 LAB — CBC
HCT: 46.2 % — ABNORMAL HIGH (ref 36.0–46.0)
Hemoglobin: 14.6 g/dL (ref 12.0–15.0)
MCH: 28.5 pg (ref 26.0–34.0)
MCHC: 31.6 g/dL (ref 30.0–36.0)
MCV: 90.1 fL (ref 80.0–100.0)
Platelets: 425 10*3/uL — ABNORMAL HIGH (ref 150–400)
RBC: 5.13 MIL/uL — ABNORMAL HIGH (ref 3.87–5.11)
RDW: 15.4 % (ref 11.5–15.5)
WBC: 17.8 10*3/uL — ABNORMAL HIGH (ref 4.0–10.5)
nRBC: 0 % (ref 0.0–0.2)

## 2020-11-17 LAB — URINALYSIS, ROUTINE W REFLEX MICROSCOPIC
Bilirubin Urine: NEGATIVE
Glucose, UA: NEGATIVE mg/dL
Ketones, ur: NEGATIVE mg/dL
Leukocytes,Ua: NEGATIVE
Nitrite: NEGATIVE
Protein, ur: NEGATIVE mg/dL
Specific Gravity, Urine: 1.015 (ref 1.005–1.030)
pH: 5 (ref 5.0–8.0)

## 2020-11-17 LAB — LIPASE, BLOOD: Lipase: 26 U/L (ref 11–51)

## 2020-11-17 LAB — COMPREHENSIVE METABOLIC PANEL
ALT: 22 U/L (ref 0–44)
AST: 19 U/L (ref 15–41)
Albumin: 3.7 g/dL (ref 3.5–5.0)
Alkaline Phosphatase: 105 U/L (ref 38–126)
Anion gap: 6 (ref 5–15)
BUN: 12 mg/dL (ref 6–20)
CO2: 23 mmol/L (ref 22–32)
Calcium: 9.4 mg/dL (ref 8.9–10.3)
Chloride: 106 mmol/L (ref 98–111)
Creatinine, Ser: 0.82 mg/dL (ref 0.44–1.00)
GFR, Estimated: >60 mL/min (ref >60)
Glucose, Bld: 120 mg/dL — ABNORMAL HIGH (ref 70–99)
Potassium: 3.4 mmol/L — ABNORMAL LOW (ref 3.5–5.1)
Sodium: 135 mmol/L (ref 135–145)
Total Bilirubin: 0.4 mg/dL (ref 0.3–1.2)
Total Protein: 7.6 g/dL (ref 6.5–8.1)

## 2020-11-17 LAB — POC URINE PREG, ED: Preg Test, Ur: NEGATIVE

## 2020-11-17 MED ORDER — OXYCODONE-ACETAMINOPHEN 5-325 MG PO TABS: 1.0000 | ORAL_TABLET | Freq: Four times a day (QID) | ORAL | 0 refills | Status: DC | PRN

## 2020-11-17 MED ORDER — PREDNISONE 10 MG PO TABS: ORAL_TABLET | ORAL | 0 refills | Status: DC

## 2020-11-17 MED ORDER — METHOCARBAMOL 500 MG PO TABS
500.0000 mg | ORAL_TABLET | Freq: Once | ORAL | Status: AC
  Administered 2020-11-17: 500 mg via ORAL
  Filled 2020-11-17: qty 1

## 2020-11-17 MED ORDER — METHOCARBAMOL 500 MG PO TABS: 500.0000 mg | ORAL_TABLET | Freq: Two times a day (BID) | ORAL | 0 refills | Status: DC

## 2020-11-17 MED ORDER — PREDNISONE 50 MG PO TABS
60.0000 mg | ORAL_TABLET | Freq: Once | ORAL | Status: AC
  Administered 2020-11-17: 60 mg via ORAL
  Filled 2020-11-17: qty 1

## 2020-11-17 MED ORDER — OXYCODONE-ACETAMINOPHEN 5-325 MG PO TABS
1.0000 | ORAL_TABLET | Freq: Once | ORAL | Status: AC
  Administered 2020-11-17: 1 via ORAL
  Filled 2020-11-17: qty 1

## 2020-11-17 NOTE — Discharge Instructions (Addendum)
Your exam and tests suggest you have mechanical low back pain/muscle strain of your lumbar spine.  Use the medicines prescribed.  Take your next dose of prednisone tomorrow (you will take one less pill daily until gone - take each daily dose together). You may take the oxycodone prescribed for pain relief.  This will make you drowsy - do not drive within 4 hours of taking this medication.  Apply heating pad or soak in a hot tub for 20 minutes several times daily to help relax the muscles in your lower back.  As discussed you do have a stone in each one of your kidneys, but this is not the source of your pain as kidney stones do not cause pain or symptoms until they start moving down the ureteral to towards your bladder.  Be aware that at some point in time this will probably occur and you will develop severe and sudden onset of pain on which ever side the stone is moving.

## 2020-11-17 NOTE — ED Triage Notes (Addendum)
Pt c/o lower abdominal and lower back pain that started last night. Denies n/v/d, urinary symptoms. Pt also requesting to checked for genital herpes because she found out her partner had them. Pt denies any vaginal discharge or itching.

## 2020-11-17 NOTE — ED Provider Notes (Signed)
Emergency Medicine Provider Triage Evaluation Note  Norma Nelson , a 35 y.o. female  was evaluated in triage.  Pt complains of right flank pain x1 day.  Pain is constant, worse with movement.  No nausea or vomiting.  No dysuria or hematuria.  Also concerned about possible STDs because her partner tested positive for genital herpes recently.  She is not having any vaginal discharge or itching..  Review of Systems  Positive: Right flank pain Negative: See above  Physical Exam  BP (!) 159/98 (BP Location: Right Arm)   Pulse 67   Temp 98.8 F (37.1 C) (Oral)   Resp 18   Ht 5\' 11"  (1.803 m)   Wt 106.6 kg   LMP 11/03/2020 (Approximate)   SpO2 100%   BMI 32.78 kg/m  Gen:   Awake, no distress   Resp:  Normal effort  MSK:   Moves extremities without difficulty  Other:  Right cvat  Medical Decision Making  Medically screening exam initiated at 10:14 AM.  Appropriate orders placed.  SHEINA MCLEISH was informed that the remainder of the evaluation will be completed by another provider, this initial triage assessment does not replace that evaluation, and the importance of remaining in the ED until their evaluation is complete.     [name], PA-C 11/17/20 1018    11/19/20, MD 11/20/20 1030

## 2020-11-18 NOTE — ED Provider Notes (Signed)
[facility] EMERGENCY DEPARTMENT Provider Note   CSN: [id number] Arrival date & time: 11/17/20  [id number]     History Chief Complaint  Patient presents with   Abdominal Pain    Norma Nelson is a 35 y.o. female with a history of HTN, presenting for evaluation of right flank pain which started yesterday.  Her pain is constant but worsened with palpation and movement.  She denies urinary complaints, no hematuria, dysuria or urgency, incontinence.  No radiation of pain or weakness. Denies fevers, chills, n/v and abdominal pain.  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  She has had no treatment prior to arrival.   Additionally reports her girlfriend tested positive for herpes last month via blood testing. Pt nor girlfriend have had complaints of painful rash, lesions or vaginal dc but inquires today about testing for herpes. She is scheduled to see her gyn next week.     The history is provided by the patient.      Past Medical History:  Diagnosis Date   Bronchitis    COVID-19    Eczema    Gonorrhea    Hypertension    Leukocytosis 2012   chronic   Mental disorder    trouble sleeping,   Papanicolaou smear of cervix with positive high risk human papilloma virus (HPV) test 09/09/2016   Trichomonosis     Patient Active Problem List   Diagnosis Date Noted   Papanicolaou smear of cervix with positive high risk human papilloma virus (HPV) test 09/09/2016   Leukocytosis 10/19/2015   Prediabetes 10/19/2015   Essential hypertension, benign 09/14/2015   Morbid obesity (HCC) 09/14/2015   Cigarette nicotine dependence without complication 09/14/2015    History reviewed. No pertinent surgical history.   OB History     Gravida  0   Para  0   Term  0   Preterm  0   AB  0   Living  0      SAB  0   IAB  0   Ectopic  0   Multiple  0   Live Births   0           Family History  Problem Relation Age of Onset   Seizures Mother    Hypertension Mother    Diabetes Mother    Stroke Mother    Heart disease Mother    Cancer Mother        neuroendocrine cancer   Hypertension Maternal Uncle    Hypertension Maternal Uncle    Hypertension Maternal Uncle    Heart attack Maternal Uncle    Hypertension Maternal Uncle     Social History   Tobacco Use   Smoking status: Every Day    Packs/day: 0.25    Years: 13.00    Pack years: 3.25    Types: Cigarettes   Smokeless tobacco: Never  Vaping Use   Vaping Use: Never used  Substance Use Topics   Alcohol use: Yes    Comment: occ   Drug use: No    Types: Opium    Home Medications Prior to Admission medications   Medication Sig Start Date End Date Taking? Authorizing Provider  methocarbamol (ROBAXIN) 500 MG tablet Take 1 tablet (500 mg total) by mouth 2 (two) times daily. 11/17/20  Yes IdolRaynelle Fanning, Rogene Meth, PA-C  oxyCODONE-acetaminophen (PERCOCET/ROXICET) [id number]  MG tablet Take 1 tablet by mouth every 6 (six) hours as needed. 11/17/20  Yes Oneisha Ammons, Raynelle Fanning, PA-C  amLODipine (NORVASC) 10 MG tablet Take 1 tablet by mouth once daily Patient taking differently: Take 10 mg by mouth daily.  05/03/19   Jacquelin Hawking, PA-C  dicloxacillin (DYNAPEN) 500 MG capsule Take 1 capsule (500 mg total) by mouth 4 (four) times daily. Patient not taking: Reported on 01/26/2020 04/27/19   Jacquelin Hawking, PA-C  fluticasone [facility]) 50 MCG/ACT nasal spray Place 2 sprays into both nostrils daily. 04/15/20   Henderly, Britni A, PA-C  metoprolol tartrate (LOPRESSOR) 50 MG tablet Take 1 tablet by mouth twice daily Patient taking differently: Take 50 mg by mouth 2 (two) times daily.  05/03/19   Jacquelin Hawking, PA-C  ondansetron (ZOFRAN ODT) 4 MG disintegrating tablet Take 1 tablet (4 mg total) by mouth every 8 (eight) hours as needed for nausea or vomiting. 01/26/20   Pollyann Savoy, MD  predniSONE (DELTASONE) 10 MG tablet  6, 5, 4, 3, 2 then 1 tablet by mouth daily for 6 days total. 11/17/20   Ben Habermann, Raynelle Fanning, PA-C  triamcinolone ointment (KENALOG) 0.1 % Apply 1 application topically 2 (two) times daily. 01/14/20   Avegno, Zachery Dakins, FNP    Allergies    Amoxicillin, Hydrocodone, Lisinopril, Naproxen, Shellfish allergy, and Tramadol  Review of Systems   Review of Systems  Constitutional:  Negative for fever.  Respiratory:  Negative for shortness of breath.   Cardiovascular:  Negative for chest pain and leg swelling.  Gastrointestinal:  Negative for abdominal distention, abdominal pain and constipation.  Genitourinary:  Negative for difficulty urinating, dysuria, flank pain, frequency, hematuria and urgency.  Musculoskeletal:  Positive for back pain. Negative for gait problem and joint swelling.  Skin:  Negative for rash.  Neurological:  Negative for weakness and numbness.   Physical Exam Updated Vital Signs BP (!) 155/87 (BP Location: Right Arm)   Pulse 78   Temp 98.6 F (37 C) (Oral)   Resp 16   Ht 5\' 11"  (1.803 m)   Wt 106.6 kg   LMP 11/03/2020 (Approximate)   SpO2 100%   BMI 32.78 kg/m   Physical Exam Vitals and nursing note reviewed.  Constitutional:      Appearance: She is well-developed.  HENT:     Head: Normocephalic.  Eyes:     Conjunctiva/sclera: Conjunctivae normal.  Cardiovascular:     Rate and Rhythm: Normal rate.     Pulses: Normal pulses.     Comments: Pedal pulses normal. Pulmonary:     Effort: Pulmonary effort is normal.  Abdominal:     General: Bowel sounds are normal. There is no distension.     Palpations: Abdomen is soft. There is no mass.  Musculoskeletal:        General: Normal range of motion.     Cervical back: Normal range of motion and neck supple.     Lumbar back: Tenderness present. No swelling, edema or spasms.  Skin:    General: Skin is warm and dry.  Neurological:     Mental Status: She is alert.     Sensory: No sensory deficit.     Motor: No tremor or  atrophy.     Gait: Gait normal.     Deep Tendon Reflexes:     Reflex Scores:      Patellar reflexes are 2+ on the right side and 2+ on the left side.      Achilles reflexes are 2+  on the right side and 2+ on the left side.    Comments: No strength deficit noted in hip and knee flexor and extensor muscle groups.  Ankle flexion and extension intact.    ED Results / Procedures / Treatments   Labs (all labs ordered are listed, but only abnormal results are displayed) Labs Reviewed  COMPREHENSIVE METABOLIC PANEL - Abnormal; Notable for the following components:      Result Value   Potassium 3.4 (*)    Glucose, Bld 120 (*)    All other components within normal limits  CBC - Abnormal; Notable for the following components:   WBC 17.8 (*)    RBC 5.13 (*)    HCT 46.2 (*)    Platelets 425 (*)    All other components within normal limits  URINALYSIS, ROUTINE W REFLEX MICROSCOPIC - Abnormal; Notable for the following components:   APPearance HAZY (*)    Hgb urine dipstick SMALL (*)    Bacteria, UA RARE (*)    All other components within normal limits  DIFFERENTIAL - Abnormal; Notable for the following components:   Neutro Abs 11.1 (*)    Monocytes Absolute 1.9 (*)    Eosinophils Absolute 0.7 (*)    Abs Immature Granulocytes 0.13 (*)    All other components within normal limits  LIPASE, BLOOD  POC URINE PREG, ED    EKG None  Radiology CT Renal Stone Study  Result Date: 11/17/2020 CLINICAL DATA:  Flank pain, kidney stone suspected EXAM: CT ABDOMEN AND PELVIS WITHOUT CONTRAST TECHNIQUE: Multidetector CT imaging of the abdomen and pelvis was performed following the standard protocol without IV contrast. COMPARISON:  None. FINDINGS: Lower chest: No acute abnormality. Hepatobiliary: No focal liver abnormality is seen. Cholelithiasis. No cholecystitis. Pancreas: Unremarkable. No pancreatic ductal dilatation or surrounding inflammatory changes. Spleen: Normal in size without focal  abnormality. Adrenals/Urinary Tract: Adrenal glands are unremarkable. No hydronephrosis. There is a punctate nonobstructive 1 mm stone in the right mid kidney and a 4 mm renal calculus in the lower pole of the left kidney. Bladder is unremarkable. Stomach/Bowel: Stomach is within normal limits. Appendix appears normal. No evidence of bowel wall thickening, distention, or inflammatory changes. Vascular/Lymphatic: No significant vascular findings are present. No enlarged abdominal or pelvic lymph nodes. Reproductive: There is a 3.0 cm likely dominant follicle in the left adnexa. Other: No abdominal wall hernia or abnormality. No abdominopelvic ascites. Musculoskeletal: No acute osseous abnormality. No suspicious lytic or blastic lesions. Mild bilateral hip degenerative changes. IMPRESSION: Punctate nonobstructive, 1 mm stone in the right mid kidney and a 4 mm renal stone in the lower pole the left kidney. No evidence of obstructive uropathy. Cholelithiasis.  No evidence of cholecystitis. Normal appendix. Electronically Signed   By: Caprice Renshaw   On: 11/17/2020 12:22    Procedures Procedures   Medications Ordered in ED Medications  oxyCODONE-acetaminophen (PERCOCET/ROXICET) 5-325 MG per tablet 1 tablet (1 tablet Oral Given 11/17/20 1458)  predniSONE (DELTASONE) tablet 60 mg (60 mg Oral Given 11/17/20 1458)  methocarbamol (ROBAXIN) tablet 500 mg (500 mg Oral Given 11/17/20 1458)    ED Course  I have reviewed the triage vital signs and the nursing notes.  Pertinent labs & imaging results that were available during my care of the patient were reviewed by me and considered in my medical decision making (see chart for details).    MDM Rules/Calculators/A&P  Labs and imaging reviewed and discussed with pt including the bilateral renal stones, but no ureteral stones.  This is not the source of sx. Advised she will at some point have sudden pain onset when these stones migrate.  Exam  suggesting msk source low back pain.  Robaxin, prednisone taper, few oxycodone tablets prescribed - also discussed rest,  role of ice/heat, body mechanics with lifting.  Return precautions outlined.    Will defer herpes screening for her gyn visit next week. Pt has no sx to suggest active herpes infection. Final Clinical Impression(s) / ED Diagnoses Final diagnoses:  Lumbar strain, initial encounter  Bilateral renal stones    Rx / DC Orders ED Discharge Orders          Ordered    methocarbamol (ROBAXIN) 500 MG tablet  2 times daily        11/17/20 1455    predniSONE (DELTASONE) 10 MG tablet  Status:  Discontinued        11/17/20 1455    oxyCODONE-acetaminophen (PERCOCET/ROXICET) 5-325 MG tablet  Every 6 hours PRN        11/17/20 1455    predniSONE (DELTASONE) 10 MG tablet        11/17/20 1502             Burgess Amor, PA-C 11/18/20 1930    Bethann Berkshire, MD 11/20/20 1030

## 2020-11-27 ENCOUNTER — Ambulatory Visit: Payer: Medicaid Other | Admitting: Urology

## 2020-11-27 DIAGNOSIS — N200 Calculus of kidney: Secondary | ICD-10-CM

## 2020-12-15 ENCOUNTER — Encounter: Payer: Self-pay | Admitting: Adult Health

## 2020-12-19 ENCOUNTER — Other Ambulatory Visit: Payer: Self-pay

## 2020-12-19 ENCOUNTER — Ambulatory Visit (INDEPENDENT_AMBULATORY_CARE_PROVIDER_SITE_OTHER): Payer: 59 | Admitting: Nurse Practitioner

## 2020-12-19 ENCOUNTER — Encounter: Payer: Self-pay | Admitting: Nurse Practitioner

## 2020-12-19 VITALS — BP 143/71 | HR 68 | Ht 71.0 in | Wt 280.0 lb

## 2020-12-19 DIAGNOSIS — Z139 Encounter for screening, unspecified: Secondary | ICD-10-CM

## 2020-12-19 DIAGNOSIS — I10 Essential (primary) hypertension: Secondary | ICD-10-CM

## 2020-12-19 DIAGNOSIS — N611 Abscess of the breast and nipple: Secondary | ICD-10-CM | POA: Diagnosis not present

## 2020-12-19 DIAGNOSIS — Z0001 Encounter for general adult medical examination with abnormal findings: Secondary | ICD-10-CM

## 2020-12-19 DIAGNOSIS — Z7689 Persons encountering health services in other specified circumstances: Secondary | ICD-10-CM | POA: Insufficient documentation

## 2020-12-19 DIAGNOSIS — N200 Calculus of kidney: Secondary | ICD-10-CM

## 2020-12-19 DIAGNOSIS — L209 Atopic dermatitis, unspecified: Secondary | ICD-10-CM | POA: Insufficient documentation

## 2020-12-19 DIAGNOSIS — L309 Dermatitis, unspecified: Secondary | ICD-10-CM | POA: Insufficient documentation

## 2020-12-19 DIAGNOSIS — Z91018 Allergy to other foods: Secondary | ICD-10-CM | POA: Insufficient documentation

## 2020-12-19 DIAGNOSIS — N644 Mastodynia: Secondary | ICD-10-CM | POA: Insufficient documentation

## 2020-12-19 MED ORDER — AMLODIPINE BESYLATE 10 MG PO TABS: 10.0000 mg | ORAL_TABLET | Freq: Every day | ORAL | 0 refills | Status: DC

## 2020-12-19 MED ORDER — DOXYCYCLINE HYCLATE 100 MG PO TABS: 100.0000 mg | ORAL_TABLET | Freq: Two times a day (BID) | ORAL | 0 refills | Status: DC

## 2020-12-19 MED ORDER — EPINEPHRINE 0.3 MG/0.3ML IJ SOAJ: 0.3000 mg | INTRAMUSCULAR | 1 refills | Status: DC | PRN

## 2020-12-19 MED ORDER — TAMSULOSIN HCL 0.4 MG PO CAPS: 0.4000 mg | ORAL_CAPSULE | Freq: Every day | ORAL | 3 refills | Status: DC

## 2020-12-19 MED ORDER — TRIAMCINOLONE ACETONIDE 0.1 % EX OINT: 1.0000 "application " | TOPICAL_OINTMENT | Freq: Two times a day (BID) | CUTANEOUS | 1 refills | Status: DC

## 2020-12-19 NOTE — Patient Instructions (Signed)
Please have fasting labs drawn 2-3 days prior to your appointment so we can discuss the results during your office visit.  

## 2020-12-19 NOTE — Assessment & Plan Note (Signed)
-  Rx. flomax -she has female significant other and states she has no risk of being or becoming pregnant

## 2020-12-19 NOTE — Assessment & Plan Note (Signed)
-  refilled triamcinolone

## 2020-12-19 NOTE — Progress Notes (Signed)
New Patient Office Visit  Subjective:  Patient ID: Norma Nelson, female    DOB: 02/15/1986  Age: 35 y.o. MRN: [id number]  CC:  Chief Complaint  Patient presents with   New Patient (Initial Visit)    Here to establish care. Went to the ER a few weeks ago for kidney stones, has 1 on each side and has not passed either one yet, still having pain and is out of medication. Also has a spot on R breast that is draining and tender to touch as well as redness.     HPI Norma Nelson presents for new patient visit. Transferring care from the [facility]. Last labs and physical was over a year ago.   She sees GYN on 01/24/21- [facility].   She has right breast abscess x 2 years. She states that she drained it last night. She states she had mammogram previously.  She had 2 kidney stones and was treated at ED about 2 weeks ago, and she states she still has pain and hasn't passed them yet.  Past Medical History:  Diagnosis Date   Bronchitis    COVID-19    Eczema    Gonorrhea    Hypertension    Leukocytosis 2012   chronic   Mental disorder    trouble sleeping,   Papanicolaou smear of cervix with positive high risk human papilloma virus (HPV) test 09/09/2016   Trichomonosis     Past Surgical History:  Procedure Laterality Date   NO PAST SURGERIES      Family History  Problem Relation Age of Onset   Seizures Mother    Hypertension Mother    Diabetes Mother    Stroke Mother    Heart disease Mother    Cancer Mother        neuroendocrine cancer   Hypertension Maternal Uncle    Hypertension Maternal Uncle    Hypertension Maternal Uncle    Heart attack Maternal Uncle    Hypertension Maternal Uncle     Social History   Socioeconomic History   Marital status: Single    Spouse name: Not on file   Number of children: 0   Years of education: Not on file   Highest education level: Not on file  Occupational History   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   Tobacco Use   Smoking status: Every Day    Packs/day: 0.25    Years: 13.00    Pack years: 3.25    Types: Cigarettes   Smokeless tobacco: Never  Vaping Use   Vaping Use: Never used  Substance and Sexual Activity   Alcohol use: Yes    Comment: occ- <1 drink per week   Drug use: Not Currently    Types: Opium   Sexual activity: Yes    Birth control/protection: None    Comment: partner is female  Other Topics Concern   Not on file  Social History Narrative   Not on file   Social Determinants of Health   Financial Resource Strain: Not on file  Food Insecurity: Not on file  Transportation Needs: Not on file  Physical Activity: Not on file  Stress: Not on file  Social Connections: Not on file  Intimate Partner Violence: Not on file    ROS Review of Systems  Constitutional: Negative.   Respiratory: Negative.    Cardiovascular: Negative.   Genitourinary:        Recent dx of kidney stones; still having pain and hasn't passed  them  Skin:        Right breast abscess; self-draining   Objective:   Today's Vitals: BP (!) 143/71 (BP Location: Left Arm, Patient Position: Sitting, Cuff Size: Large)   Pulse 68   Ht _0  (1.803 m)   Wt 280 lb (127 kg)   LMP 12/19/2020 (Exact Date)   SpO2 98%   BMI 39.05 kg/m   Physical Exam Exam conducted with a chaperone present [facility]).  Constitutional:      Appearance: Normal appearance. She is obese.  Cardiovascular:     Rate and Rhythm: Normal rate and regular rhythm.     Pulses: Normal pulses.     Heart sounds: Normal heart sounds.  Pulmonary:     Effort: Pulmonary effort is normal.     Breath sounds: Normal breath sounds.  Chest:     Chest wall: Swelling and tenderness present. No mass.  Breasts:    Right: Swelling and tenderness present.     Left: Normal.    Lymphadenopathy:     Upper Body:     Right upper body: No supraclavicular, axillary or pectoral adenopathy.     Left upper body: No supraclavicular, axillary or  pectoral adenopathy.  Skin:    Comments: Right breast abscess at 3 o clock- border of areola  Neurological:     Mental Status: She is alert.  Psychiatric:        Mood and Affect: Mood normal.        Behavior: Behavior normal.        Thought Content: Thought content normal.        Judgment: Judgment normal.    Assessment & Plan:   Problem List Items Addressed This Visit       Cardiovascular and Mediastinum   Essential hypertension, benign   Relevant Medications   EPINEPHrine (EPIPEN 2-PAK) 0.3 mg/0.3 mL IJ SOAJ injection   amLODipine (NORVASC) 10 MG tablet     Musculoskeletal and Integument   Eczema    -refilled triamcinolone      Relevant Medications   triamcinolone ointment (KENALOG) 0.1 %     Genitourinary   Kidney stone    -Rx. flomax -she has female significant other and states she has no risk of being or becoming pregnant      Relevant Medications   tamsulosin (FLOMAX) 0.4 MG CAPS capsule     Other   Establishing care with new doctor, encounter for - Primary   Breast pain, right    -Abscess to right breast; non-healing x2 years -she states it comes and goes; unsure of etiology- HS vs abscess vs Paget's  -Rx. Doxycycline -will treat for abscess today; had swelling and dimpling that resolved after abscess started draining -referral to derm for biopsy since it has been non-healing for 2 years -will get [place] and diag mammo -she states she had nipple discharge, but none noted on exam today      Relevant Orders   Ambulatory referral to Dermatology   MM Digital Diagnostic Bilat   US BREAST COMPLETE UNI RIGHT INC AXILLA   Food allergy   Relevant Medications   EPINEPHrine (EPIPEN 2-PAK) 0.3 mg/0.3 mL IJ SOAJ injection   RESOLVED: Breast abscess   Relevant Medications   doxycycline (VIBRA-TABS) 100 MG tablet   Other Relevant Orders   Ambulatory referral to Dermatology   MM Digital Diagnostic Bilat   US BREAST COMPLETE UNI RIGHT INC AXILLA   Other Visit  Diagnoses     Encounter for  general adult medical examination with abnormal findings       Relevant Orders   CBC with Differential/Platelet   CMP14+EGFR   Lipid Panel With LDL/HDL Ratio   TSH   Hepatitis C antibody   Screening due       Relevant Orders   Hepatitis C antibody       Outpatient Encounter Medications as of 12/19/2020  Medication Sig   doxycycline (VIBRA-TABS) 100 MG tablet Take 1 tablet (100 mg total) by mouth 2 (two) times daily.   EPINEPHrine (EPIPEN 2-PAK) 0.3 mg/0.3 mL IJ SOAJ injection Inject 0.3 mg into the muscle as needed for anaphylaxis. If you use this pen, go to the emergency department immediately.   fluticasone (FLONASE) 50 MCG/ACT nasal spray Place 2 sprays into both nostrils daily.   metoprolol tartrate (LOPRESSOR) 50 MG tablet Take 1 tablet by mouth twice daily (Patient taking differently: Take 50 mg by mouth 2 (two) times daily.)   tamsulosin (FLOMAX) 0.4 MG CAPS capsule Take 1 capsule (0.4 mg total) by mouth daily.   [DISCONTINUED] amLODipine (NORVASC) 10 MG tablet Take 1 tablet by mouth once daily (Patient taking differently: Take 10 mg by mouth daily.)   [DISCONTINUED] methocarbamol (ROBAXIN) 500 MG tablet Take 1 tablet (500 mg total) by mouth 2 (two) times daily.   [DISCONTINUED] triamcinolone ointment (KENALOG) 0.1 % Apply 1 application topically 2 (two) times daily.   amLODipine (NORVASC) 10 MG tablet Take 1 tablet (10 mg total) by mouth daily.   triamcinolone ointment (KENALOG) 0.1 % Apply 1 application topically 2 (two) times daily.   [DISCONTINUED] dicloxacillin (DYNAPEN) 500 MG capsule Take 1 capsule (500 mg total) by mouth 4 (four) times daily. (Patient not taking: Reported on 01/26/2020)   [DISCONTINUED] ondansetron (ZOFRAN ODT) 4 MG disintegrating tablet Take 1 tablet (4 mg total) by mouth every 8 (eight) hours as needed for nausea or vomiting.   [DISCONTINUED] oxyCODONE-acetaminophen (PERCOCET/ROXICET) 5-325 MG tablet Take 1 tablet by mouth  every 6 (six) hours as needed.   [DISCONTINUED] predniSONE (DELTASONE) 10 MG tablet 6, 5, 4, 3, 2 then 1 tablet by mouth daily for 6 days total.   No facility-administered encounter medications on file as of 12/19/2020.    Follow-up: Return in about 2 weeks (around 01/02/2021) for Physical Exam (breast abscess and kideny stone).   Noreene Larsson, NP

## 2020-12-19 NOTE — Assessment & Plan Note (Addendum)
-  Abscess to right breast; non-healing x2 years -she states it comes and goes; unsure of etiology- HS vs abscess vs Paget's  -Rx. Doxycycline -will treat for abscess today; had swelling and dimpling that resolved after abscess started draining -referral to derm for biopsy since it has been non-healing for 2 years -will get [place] and diag mammo -she states she had nipple discharge, but none noted on exam today

## 2021-01-04 ENCOUNTER — Ambulatory Visit (INDEPENDENT_AMBULATORY_CARE_PROVIDER_SITE_OTHER): Payer: 59 | Admitting: Nurse Practitioner

## 2021-01-04 ENCOUNTER — Other Ambulatory Visit: Payer: Self-pay

## 2021-01-04 ENCOUNTER — Encounter: Payer: Self-pay | Admitting: Nurse Practitioner

## 2021-01-04 VITALS — BP 154/86 | HR 76 | Temp 98.3°F | Ht 71.0 in | Wt 274.0 lb

## 2021-01-04 DIAGNOSIS — N200 Calculus of kidney: Secondary | ICD-10-CM

## 2021-01-04 DIAGNOSIS — N644 Mastodynia: Secondary | ICD-10-CM

## 2021-01-04 DIAGNOSIS — T148XXA Other injury of unspecified body region, initial encounter: Secondary | ICD-10-CM | POA: Diagnosis not present

## 2021-01-04 MED ORDER — PREDNISONE 20 MG PO TABS
40.0000 mg | ORAL_TABLET | Freq: Every day | ORAL | 0 refills | Status: DC
Start: 2021-01-04 — End: 2022-05-17

## 2021-01-04 MED ORDER — TIZANIDINE HCL 4 MG PO TABS: 4.0000 mg | ORAL_TABLET | Freq: Four times a day (QID) | ORAL | 0 refills | Status: DC | PRN

## 2021-01-04 MED ORDER — IBUPROFEN 800 MG PO TABS: 800.0000 mg | ORAL_TABLET | Freq: Three times a day (TID) | ORAL | 0 refills | Status: DC | PRN

## 2021-01-04 NOTE — Progress Notes (Signed)
Acute Office Visit  Subjective:    Patient ID: Norma Nelson, female    DOB: 1985/06/27, 35 y.o.   MRN: [id number]  Chief Complaint  Patient presents with   Follow-up    Has not had labs. Is still having back pain, has not passed either of the 2 kidney stones yet.     HPI Patient is in today for follow-up for breast wound x2 years. At last OV, she was started on doxycycline and sent for imaging. No imaging results available for breast issue.  From previous ED records, "Labs and imaging reviewed and discussed with pt including the bilateral renal stones, but no ureteral stones.  This is not the source of sx. Advised she will at some point have sudden pain onset when these stones migrate.  Exam suggesting msk source low back pain.  Robaxin, prednisone taper, few oxycodone tablets prescribed - also discussed rest,  role of ice/heat, body mechanics with lifting.  Return precautions outlined. "  Her pain is 10/10. She states that her pain is constant on her right side. Sitting up makes it feel better, and laying down or twisting makes it feel worse. Her pain is just in her right lower back; no radiation. She describes the pain as aching and sharp.  Past Medical History:  Diagnosis Date   Bronchitis    COVID-19    Eczema    Gonorrhea    Hypertension    Leukocytosis 2012   chronic   Mental disorder    trouble sleeping,   Papanicolaou smear of cervix with positive high risk human papilloma virus (HPV) test 09/09/2016   Trichomonosis     Past Surgical History:  Procedure Laterality Date   NO PAST SURGERIES      Family History  Problem Relation Age of Onset   Seizures Mother    Hypertension Mother    Diabetes Mother    Stroke Mother    Heart disease Mother    Cancer Mother        neuroendocrine cancer   Hypertension Maternal Uncle    Hypertension Maternal Uncle    Hypertension Maternal Uncle    Heart attack Maternal Uncle    Hypertension Maternal Uncle     Social  History   Socioeconomic History   Marital status: Single    Spouse name: Not on file   Number of children: 0   Years of education: Not on file   Highest education level: Not on file  Occupational History   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  Tobacco Use   Smoking status: Every Day    Packs/day: 0.25    Years: 13.00    Pack years: 3.25    Types: Cigarettes   Smokeless tobacco: Never  Vaping Use   Vaping Use: Never used  Substance and Sexual Activity   Alcohol use: Yes    Comment: occ- <1 drink per week   Drug use: Not Currently    Types: Opium   Sexual activity: Yes    Birth control/protection: None    Comment: partner is female  Other Topics Concern   Not on file  Social History Narrative   Not on file   Social Determinants of Health   Financial Resource Strain: Not on file  Food Insecurity: Not on file  Transportation Needs: Not on file  Physical Activity: Not on file  Stress: Not on file  Social Connections: Not on file  Intimate Partner Violence: Not on file    Outpatient  Medications Prior to Visit  Medication Sig Dispense Refill   amLODipine (NORVASC) 10 MG tablet Take 1 tablet (10 mg total) by mouth daily. 30 tablet 0   doxycycline (VIBRA-TABS) 100 MG tablet Take 1 tablet (100 mg total) by mouth 2 (two) times daily. 20 tablet 0   EPINEPHrine (EPIPEN 2-PAK) 0.3 mg/0.3 mL IJ SOAJ injection Inject 0.3 mg into the muscle as needed for anaphylaxis. If you use this pen, go to the emergency department immediately. 1 each 1   fluticasone (FLONASE) 50 MCG/ACT nasal spray Place 2 sprays into both nostrils daily. 11.1 mL 0   metoprolol tartrate (LOPRESSOR) 50 MG tablet Take 1 tablet by mouth twice daily (Patient taking differently: Take 50 mg by mouth 2 (two) times daily.) 60 tablet 0   tamsulosin (FLOMAX) 0.4 MG CAPS capsule Take 1 capsule (0.4 mg total) by mouth daily. 30 capsule 3   triamcinolone ointment (KENALOG) 0.1 % Apply 1 application topically 2  (two) times daily. 30 g 1   No facility-administered medications prior to visit.    Allergies  Allergen Reactions   Amoxicillin Hives   Hydrocodone Nausea And Vomiting   Lisinopril Other (See Comments)    "takes all fluid off her body"   Naproxen Nausea And Vomiting   Shellfish Allergy Itching   Tramadol     "bleed"    Review of Systems  Constitutional: Negative.   Respiratory: Negative.    Cardiovascular: Negative.   Musculoskeletal:  Positive for back pain.       Right lower back pain      Objective:    Physical Exam Constitutional:      Appearance: Normal appearance. She is obese.  Cardiovascular:     Rate and Rhythm: Normal rate and regular rhythm.     Pulses: Normal pulses.     Heart sounds: Normal heart sounds.  Pulmonary:     Effort: Pulmonary effort is normal.     Breath sounds: Normal breath sounds.  Musculoskeletal:        General: Swelling and tenderness present.     Comments: Right lower back tenderness  Neurological:     Mental Status: She is alert.  Psychiatric:        Mood and Affect: Mood normal.        Behavior: Behavior normal.        Thought Content: Thought content normal.        Judgment: Judgment normal.    BP (!) 154/86 (BP Location: Left Arm, Patient Position: Sitting, Cuff Size: Large)   Pulse 76   Temp 98.3 F (36.8 C) (Oral)   Ht 5\' 11"  (1.803 m)   Wt 274 lb (124.3 kg)   LMP 12/19/2020 (Exact Date)   SpO2 96%   BMI 38.22 kg/m  Wt Readings from Last 3 Encounters:  01/04/21 274 lb (124.3 kg)  12/19/20 280 lb (127 kg)  11/17/20 235 lb (106.6 kg)    Health Maintenance Due  Topic Date Due   Hepatitis C Screening  Never done   PAP SMEAR-Modifier  09/05/2019    There are no preventive care reminders to display for this patient.   No results found for: TSH Lab Results  Component Value Date   WBC 17.8 (H) 11/17/2020   HGB 14.6 11/17/2020   HCT 46.2 (H) 11/17/2020   MCV 90.1 11/17/2020   PLT 425 (H) 11/17/2020   Lab  Results  Component Value Date   NA 135 11/17/2020   K 3.4 (L)  11/17/2020   CO2 23 11/17/2020   GLUCOSE 120 (H) 11/17/2020   BUN 12 11/17/2020   CREATININE 0.82 11/17/2020   BILITOT 0.4 11/17/2020   ALKPHOS 105 11/17/2020   AST 19 11/17/2020   ALT 22 11/17/2020   PROT 7.6 11/17/2020   ALBUMIN 3.7 11/17/2020   CALCIUM 9.4 11/17/2020   ANIONGAP 6 11/17/2020   Lab Results  Component Value Date   CHOL 146 11/07/2017   Lab Results  Component Value Date   HDL 38 (L) 11/07/2017   Lab Results  Component Value Date   LDLCALC 90 11/07/2017   Lab Results  Component Value Date   TRIG 89 11/07/2017   Lab Results  Component Value Date   CHOLHDL 3.8 11/07/2017   Lab Results  Component Value Date   HGBA1C 5.9 (H) 11/07/2017       Assessment & Plan:   Problem List Items Addressed This Visit       Musculoskeletal and Integument   Muscle strain - Primary    -to right lower back -Rx. Prednisone, tizanidine, and ibuprofen -will consider ortho consult if no improvement in 1 week -pt states that she can take ibuprofen without issues      Relevant Medications   ibuprofen (ADVIL) 800 MG tablet   predniSONE (DELTASONE) 20 MG tablet   tiZANidine (ZANAFLEX) 4 MG tablet     Genitourinary   Kidney stone    -was started on flomax at last OV, but no improvement in symptoms -she has not noticed if she passed a stone because she isn't looking in the toilet -considered urology referral, but not ordered today bc symptoms seem more MSK in origin; (no colicky pain; pain is worse with motion)        Other   Breast pain, right    -imaging ordered at last OV, but she states she hasn't been called -will check in with radiology to get these tests completed -was called by derm, and she will call them back to set up appt        Meds ordered this encounter  Medications   ibuprofen (ADVIL) 800 MG tablet    Sig: Take 1 tablet (800 mg total) by mouth every 8 (eight) hours as needed.     Dispense:  30 tablet    Refill:  0   predniSONE (DELTASONE) 20 MG tablet    Sig: Take 2 tablets (40 mg total) by mouth daily with breakfast.    Dispense:  10 tablet    Refill:  0   tiZANidine (ZANAFLEX) 4 MG tablet    Sig: Take 1 tablet (4 mg total) by mouth every 6 (six) hours as needed for muscle spasms.    Dispense:  30 tablet    Refill:  0     Heather Roberts, NP

## 2021-01-04 NOTE — Assessment & Plan Note (Addendum)
-  was started on flomax at last OV, but no improvement in symptoms -she has not noticed if she passed a stone because she isn't looking in the toilet -considered urology referral, but not ordered today bc symptoms seem more MSK in origin; (no colicky pain; pain is worse with motion)

## 2021-01-04 NOTE — Assessment & Plan Note (Addendum)
-  to right lower back -Rx. Prednisone, tizanidine, and ibuprofen -will consider ortho consult if no improvement in 1 week -pt states that she can take ibuprofen without issues

## 2021-01-04 NOTE — Assessment & Plan Note (Signed)
-  imaging ordered at last OV, but she states she hasn't been called -will check in with radiology to get these tests completed -was called by derm, and she will call them back to set up appt

## 2021-01-04 NOTE — Patient Instructions (Signed)
Please have fasting labs drawn in the next 7 days. 

## 2021-01-16 LAB — CBC WITH DIFFERENTIAL/PLATELET
Basophils Absolute: 0.2 10*3/uL (ref 0.0–0.2)
Basos: 1 %
EOS (ABSOLUTE): 0.2 10*3/uL (ref 0.0–0.4)
Eos: 1 %
Hematocrit: 43 % (ref 34.0–46.6)
Hemoglobin: 13.6 g/dL (ref 11.1–15.9)
Immature Grans (Abs): 0.1 10*3/uL (ref 0.0–0.1)
Immature Granulocytes: 1 %
Lymphocytes Absolute: 4.6 10*3/uL — ABNORMAL HIGH (ref 0.7–3.1)
Lymphs: 18 %
MCH: 27.5 pg (ref 26.6–33.0)
MCHC: 31.6 g/dL (ref 31.5–35.7)
MCV: 87 fL (ref 79–97)
Monocytes Absolute: 2.4 10*3/uL — ABNORMAL HIGH (ref 0.1–0.9)
Monocytes: 10 %
Neutrophils Absolute: 17.3 10*3/uL — ABNORMAL HIGH (ref 1.4–7.0)
Neutrophils: 69 %
Platelets: 436 10*3/uL (ref 150–450)
RBC: 4.95 x10E6/uL (ref 3.77–5.28)
RDW: 14.2 % (ref 11.7–15.4)
WBC: 24.8 10*3/uL (ref 3.4–10.8)

## 2021-01-16 LAB — CMP14+EGFR
ALT: 14 IU/L (ref 0–32)
AST: 15 IU/L (ref 0–40)
Albumin/Globulin Ratio: 1.2 (ref 1.2–2.2)
Albumin: 3.9 g/dL (ref 3.8–4.8)
Alkaline Phosphatase: 107 IU/L (ref 44–121)
BUN/Creatinine Ratio: 16 (ref 9–23)
BUN: 13 mg/dL (ref 6–20)
Bilirubin Total: <0.2 mg/dL (ref 0.0–1.2)
CO2: 16 mmol/L — ABNORMAL LOW (ref 20–29)
Calcium: 10.6 mg/dL — ABNORMAL HIGH (ref 8.7–10.2)
Chloride: 107 mmol/L — ABNORMAL HIGH (ref 96–106)
Creatinine, Ser: 0.8 mg/dL (ref 0.57–1.00)
Globulin, Total: 3.3 g/dL (ref 1.5–4.5)
Glucose: 90 mg/dL (ref 70–99)
Potassium: 3.5 mmol/L (ref 3.5–5.2)
Sodium: 140 mmol/L (ref 134–144)
Total Protein: 7.2 g/dL (ref 6.0–8.5)
eGFR: 100 mL/min/{1.73_m2} (ref >59)

## 2021-01-16 LAB — LIPID PANEL WITH LDL/HDL RATIO
Cholesterol, Total: 118 mg/dL (ref 100–199)
HDL: 43 mg/dL (ref >39)
LDL Chol Calc (NIH): 60 mg/dL (ref 0–99)
LDL/HDL Ratio: 1.4 ratio (ref 0.0–3.2)
Triglycerides: 75 mg/dL (ref 0–149)
VLDL Cholesterol Cal: 15 mg/dL (ref 5–40)

## 2021-01-16 LAB — HEPATITIS C ANTIBODY: Hep C Virus Ab: <0.1 s/co ratio (ref 0.0–0.9)

## 2021-01-16 LAB — TSH: TSH: 1.1 u[IU]/mL (ref 0.450–4.500)

## 2021-01-16 NOTE — Progress Notes (Signed)
Her WBC is significantly elevated, but she has been taking steroids. What symptoms is she having today?

## 2021-01-22 ENCOUNTER — Encounter: Payer: 59 | Admitting: Nurse Practitioner

## 2021-01-24 ENCOUNTER — Encounter: Payer: Medicaid Other | Admitting: Adult Health

## 2021-03-08 ENCOUNTER — Encounter: Payer: Medicaid Other | Admitting: Adult Health

## 2021-04-24 ENCOUNTER — Encounter: Payer: 59 | Admitting: Nurse Practitioner

## 2021-05-01 ENCOUNTER — Encounter: Payer: 59 | Admitting: Nurse Practitioner

## 2021-10-18 ENCOUNTER — Ambulatory Visit: Payer: Medicaid Other | Admitting: Adult Health

## 2021-10-30 ENCOUNTER — Encounter: Payer: Self-pay | Admitting: Nurse Practitioner

## 2021-11-29 ENCOUNTER — Ambulatory Visit: Payer: Medicaid Other | Admitting: Adult Health

## 2022-05-07 ENCOUNTER — Encounter: Payer: Self-pay | Admitting: Adult Health

## 2022-05-07 ENCOUNTER — Other Ambulatory Visit (HOSPITAL_COMMUNITY)
Admission: RE | Admit: 2022-05-07 | Discharge: 2022-05-07 | Disposition: A | Discharge status: Home / Self Care | Payer: Medicaid Other | Source: Ambulatory Visit | Attending: Adult Health | Admitting: Adult Health

## 2022-05-07 ENCOUNTER — Ambulatory Visit (INDEPENDENT_AMBULATORY_CARE_PROVIDER_SITE_OTHER): Payer: Medicaid Other | Admitting: Adult Health

## 2022-05-07 VITALS — BP 173/101 | HR 83 | Ht 71.0 in | Wt 283.8 lb

## 2022-05-07 DIAGNOSIS — I10 Essential (primary) hypertension: Secondary | ICD-10-CM

## 2022-05-07 DIAGNOSIS — N611 Abscess of the breast and nipple: Secondary | ICD-10-CM | POA: Diagnosis not present

## 2022-05-07 DIAGNOSIS — Z01419 Encounter for gynecological examination (general) (routine) without abnormal findings: Secondary | ICD-10-CM | POA: Diagnosis not present

## 2022-05-07 DIAGNOSIS — N920 Excessive and frequent menstruation with regular cycle: Secondary | ICD-10-CM

## 2022-05-07 MED ORDER — MEGESTROL ACETATE 40 MG PO TABS: ORAL_TABLET | ORAL | 3 refills | Status: DC

## 2022-05-07 NOTE — Progress Notes (Signed)
Patient ID: Norma Nelson, female   DOB: January 15, 1986, 37 y.o.   MRN: [id number] History of Present Illness: Norma Nelson is a 37 year old black female,single, G0P0, in for a well woman gyn exam and pap. She is taking doxycycline for right breast abscess, has on and off for over a year she says. She says periods last 7 days and are heavy at the beginning, may change pad every 3 hours, no cramps, has taken friends megace and it stops the heavy bleeding.  PCP is [facility] in [place].   Current Medications, Allergies, Past Medical History, Past Surgical History, Family History and Social History were reviewed in [employer] record.     Review of Systems: Patient denies any headaches, hearing loss, fatigue, blurred vision, shortness of breath, chest pain, abdominal pain, problems with bowel movements, urination, or intercourse(has female partner). No joint pain or mood swings.  See HPI for positives.    Physical Exam:BP (!) 173/101 (BP Location: Left Arm, Patient Position: Sitting, Cuff Size: Large)   Pulse 83   Ht 5\' 11"  (1.803 m)   Wt 283 lb 12.8 oz (128.7 kg)   BMI 39.58 kg/m   General:  Well developed, well nourished, no acute distress Skin:  Warm and dry Neck:  Midline trachea, normal thyroid, good ROM, no lymphadenopathy Lungs; Clear to auscultation bilaterally Breast:  No dominant palpable mass, retraction, or nipple discharge on the left, on the right no retraction or nipple discharge, has abscess at 5 0' clock at edge of areola, is is red and draining Cardiovascular: Regular rate and rhythm Abdomen:  Soft, non tender, no hepatosplenomegaly Pelvic:  External genitalia is normal in appearance, no lesions.  The vagina is normal in appearance. Urethra has no lesions or masses. The cervix is nulliparous, pap with GC/CHL and HR HPV genotyping performed.  Uterus is felt to be normal size, shape, and contour.  No adnexal masses or tenderness noted.Bladder is non  tender, no masses felt. Extremities/musculoskeletal:  No swelling or varicosities noted, no clubbing or cyanosis Psych:  No mood changes, alert and cooperative,seems happy AA is 4 Fall risk is low    05/07/2022    9:57 AM 01/04/2021    4:08 PM 12/19/2020    2:05 PM  Depression screen PHQ 2/9  Decreased Interest 1 0 0  Down, Depressed, Hopeless 1 0 0  PHQ - 2 Score 2 0 0  Altered sleeping 1    Tired, decreased energy 0    Change in appetite 0    Feeling bad or failure about yourself  0    Trouble concentrating 0    Moving slowly or fidgety/restless 0    Suicidal thoughts 0    PHQ-9 Score 3         05/07/2022    9:57 AM  GAD 7 : Generalized Anxiety Score  Nervous, Anxious, on Edge 0  Control/stop worrying 0  Worry too much - different things 0  Trouble relaxing 1  Restless 0  Easily annoyed or irritable 1  Afraid - awful might happen 0  Total GAD 7 Score 2    Upstream - 05/07/22 0957       Pregnancy Intention Screening   Does the patient want to become pregnant in the next year? No    Does the patient's partner want to become pregnant in the next year? No    Would the patient like to discuss contraceptive options today? No  Contraception Wrap Up   Current Method No Method - Other Reason    Reason for No Current Contraceptive Method at Intake ([facility] Only) Same Sex Partner    End Method No Method - Other Reason    Contraception Counseling Provided No              Examination chaperoned by Celene Squibb LPN   Impression and Plan: 1. Encounter for gynecological examination with Papanicolaou smear of cervix Pap sent  Pap in 3 years if normal Physical in 1 year Labs with PCP   2. Essential hypertension, benign On Norvasc, go home and take meds,did not take this morning She says was on 2 meds for BP and PCP took off 1of them  Will recheck in 10 days   3. Menorrhagia with regular cycle Periods last 7 days and can be heavy, will change pad every 3 hours,  no cramps Will rx megace  Meds ordered this encounter  Medications   megestrol (MEGACE) 40 MG tablet    Sig: Take 1-2 daily on days of heavy bleeding    Dispense:  30 tablet    Refill:  3    Order Specific Question:   Supervising Provider    Answer:   Elonda Husky, LUTHER H [[id number]]     4. Breast abscess Take doxycycline as per PCP Will recheck in 10 days,will get mammogram and [place] to assess

## 2022-05-09 LAB — CYTOLOGY - PAP
Adequacy: ABSENT
Chlamydia: NEGATIVE
Comment: NEGATIVE
Comment: NEGATIVE
Comment: NORMAL
Diagnosis: NEGATIVE
High risk HPV: NEGATIVE
Neisseria Gonorrhea: NEGATIVE

## 2022-05-17 ENCOUNTER — Ambulatory Visit (INDEPENDENT_AMBULATORY_CARE_PROVIDER_SITE_OTHER): Payer: Medicaid Other | Admitting: Adult Health

## 2022-05-17 ENCOUNTER — Encounter: Payer: Self-pay | Admitting: Adult Health

## 2022-05-17 VITALS — BP 181/111 | HR 82 | Ht 71.0 in | Wt 282.0 lb

## 2022-05-17 DIAGNOSIS — I10 Essential (primary) hypertension: Secondary | ICD-10-CM | POA: Diagnosis not present

## 2022-05-17 DIAGNOSIS — N611 Abscess of the breast and nipple: Secondary | ICD-10-CM | POA: Diagnosis not present

## 2022-05-17 NOTE — Progress Notes (Signed)
Subjective:     Patient ID: Norma Nelson, female   DOB: [birth year], 37 y.o.   MRN: [id number]  HPI Norma Nelson is a 37 year old black female,single, G0P0, in for follow up on breast abscess and BP check. She says PPC changed meds. Still on doxycycline.  Last pap was negative HPV,NILM 05/07/22 PCP [facility]  Review of Systems Breast is better Reviewed past medical,surgical, social and family history. Reviewed medications and allergies.     Objective:   Physical Exam BP (!) 181/111 (BP Location: Right Arm, Patient Position: Sitting, Cuff Size: Normal)   Pulse 82   Ht 5\' 11"  (1.803 m)   Wt 282 lb (127.9 kg)   LMP 04/15/2022 Comment: takes Megace  BMI 39.33 kg/m  took BP meds at 9 am Breast:  No dominant palpable mass, retraction, or nipple discharge on the left, on the right no retraction or nipple discharge, has abscess at 5 0' clock at edge of areola, is no longer red and dry, still feels thick   Fall risk is low  [place] - 05/17/22 1117       Pregnancy Intention Screening   Does the patient want to become pregnant in the next year? No    Does the patient's partner want to become pregnant in the next year? No    Would the patient like to discuss contraceptive options today? No      Contraception Wrap Up   Current Method No Method - Other Reason    End Method No Method - Other Reason    Contraception Counseling Provided No             Assessment:     1. Breast abscess Scheduled for her 06/11/22 at 11:20 am at [facility] to assess further  Orders Placed This Encounter  Procedures   US BREAST LTD UNI RIGHT INC AXILLA    Standing Status:   Future    Standing Expiration Date:   05/18/2023    Order Specific Question:   Reason for Exam (SYMPTOM  OR DIAGNOSIS REQUIRED)    Answer:   hx abscess rt breast at 5 0'clock    Order Specific Question:   Preferred imaging location?    Answer:   [facility]   MM DIAG BREAST TOMO BILATERAL    NAS No  implants NMD-[facility]    Standing Status:   Future    Standing Expiration Date:   05/18/2023    Order Specific Question:   Reason for Exam (SYMPTOM  OR DIAGNOSIS REQUIRED)    Answer:   hx abscess at 5 0'clock rt breast    Order Specific Question:   Is the patient pregnant?    Answer:   No    Order Specific Question:   Preferred imaging location?    Answer:   [facility]   US BREAST LTD UNI LEFT INC AXILLA    Standing Status:   Future    Standing Expiration Date:   05/18/2023    Order Specific Question:   Reason for Exam (SYMPTOM  OR DIAGNOSIS REQUIRED)    Answer:   hx abscess rt breast at 5 0'clock    Order Specific Question:   Preferred imaging location?    Answer:   [facility]     2. Essential hypertension, benign Follow up with PCP Take meds every day Try to decrease smoking     Plan:     Follow up prn

## 2022-06-11 ENCOUNTER — Encounter (HOSPITAL_COMMUNITY): Payer: Self-pay

## 2022-06-11 ENCOUNTER — Ambulatory Visit (HOSPITAL_COMMUNITY)
Admission: RE | Admit: 2022-06-11 | Discharge: 2022-06-11 | Disposition: A | Discharge status: Home / Self Care | Payer: Medicaid Other | Source: Ambulatory Visit | Attending: Adult Health | Admitting: Adult Health

## 2022-06-11 DIAGNOSIS — N611 Abscess of the breast and nipple: Secondary | ICD-10-CM

## 2022-06-18 ENCOUNTER — Encounter: Payer: Self-pay | Admitting: Family Medicine

## 2022-06-18 ENCOUNTER — Ambulatory Visit: Payer: Medicaid Other | Admitting: Family Medicine

## 2022-06-18 VITALS — BP 160/102 | HR 89 | Ht 71.0 in | Wt 281.0 lb

## 2022-06-18 DIAGNOSIS — E039 Hypothyroidism, unspecified: Secondary | ICD-10-CM

## 2022-06-18 DIAGNOSIS — R7301 Impaired fasting glucose: Secondary | ICD-10-CM | POA: Diagnosis not present

## 2022-06-18 DIAGNOSIS — L309 Dermatitis, unspecified: Secondary | ICD-10-CM

## 2022-06-18 DIAGNOSIS — E785 Hyperlipidemia, unspecified: Secondary | ICD-10-CM | POA: Diagnosis not present

## 2022-06-18 DIAGNOSIS — I10 Essential (primary) hypertension: Secondary | ICD-10-CM

## 2022-06-18 MED ORDER — METOPROLOL TARTRATE 25 MG PO TABS: 25.0000 mg | ORAL_TABLET | Freq: Two times a day (BID) | ORAL | 3 refills | Status: DC

## 2022-06-18 MED ORDER — TRIAMCINOLONE ACETONIDE 0.1 % EX OINT: 1.0000 | TOPICAL_OINTMENT | Freq: Two times a day (BID) | CUTANEOUS | 3 refills | Status: DC

## 2022-06-18 NOTE — Progress Notes (Signed)
New Patient Office Visit   Subjective   Patient ID: Norma Nelson, female    DOB: 28-Nov-1985  Age: 37 y.o. MRN: [id number]  CC:  Chief Complaint  Patient presents with   Establish Care   Hypertension    HPI Norma Nelson 37 year old female, presents to establish care. She  has a past medical history of Bronchitis, COVID-19, Eczema, Gonorrhea, Hypertension, Leukocytosis (2012), Mental disorder, Papanicolaou smear of cervix with positive high risk human papilloma virus (HPV) test (09/09/2016), and Trichomonosis.   Patient here for elevated blood pressure. She is not exercising and is adherent to low salt diet.  Blood pressure is not well controlled at home. Cardiac symptoms fatigue. Patient denies chest pain, chest pressure/discomfort, dyspnea, lower extremity edema, palpitations, syncope, and tachypnea.  Cardiovascular risk factors: hypertension, obesity (BMI >= 30 kg/m2), and smoking/ tobacco exposure.       Outpatient Encounter Medications as of 06/18/2022  Medication Sig   amLODipine (NORVASC) 10 MG tablet Take 1 tablet (10 mg total) by mouth daily.   megestrol (MEGACE) 40 MG tablet Take 1-2 daily on days of heavy bleeding   metoprolol tartrate (LOPRESSOR) 25 MG tablet Take 1 tablet (25 mg total) by mouth 2 (two) times daily.   [DISCONTINUED] triamcinolone ointment (KENALOG) 0.1 % Apply 1 application topically 2 (two) times daily.   EPINEPHrine (EPIPEN 2-PAK) 0.3 mg/0.3 mL IJ SOAJ injection Inject 0.3 mg into the muscle as needed for anaphylaxis. If you use this pen, go to the emergency department immediately. (Patient not taking: Reported on 05/07/2022)   triamcinolone ointment (KENALOG) 0.1 % Apply 1 Application topically 2 (two) times daily.   No facility-administered encounter medications on file as of 06/18/2022.    Past Surgical History:  Procedure Laterality Date   NO PAST SURGERIES      Review of Systems  Constitutional:  Negative for chills and fever.   Respiratory:  Negative for shortness of breath.   Cardiovascular:  Negative for chest pain and palpitations.  Gastrointestinal:  Negative for abdominal pain, nausea and vomiting.  Neurological:  Positive for headaches. Negative for dizziness.      Objective    BP (!) 160/102   Pulse 89   Ht '5\' 11"'$  (1.803 m)   Wt 281 lb (127.5 kg)   LMP 05/16/2022 Comment: takes Megace  SpO2 96%   BMI 39.19 kg/m   Physical Exam Constitutional:      Appearance: She is obese.  Cardiovascular:     Rate and Rhythm: Normal rate and regular rhythm.     Pulses: Normal pulses.  Pulmonary:     Effort: Pulmonary effort is normal.     Breath sounds: Normal breath sounds.  Musculoskeletal:        General: Normal range of motion.  Skin:    General: Skin is warm and dry.     Capillary Refill: Capillary refill takes less than 2 seconds.  Neurological:     Mental Status: She is alert.     Coordination: Coordination normal.     Gait: Gait normal.  Psychiatric:        Thought Content: Thought content normal.       Assessment & Plan:  Primary hypertension -     Microalbumin / creatinine urine ratio -     CMP14+EGFR -     CBC with Differential/Platelet -     Metoprolol Tartrate; Take 1 tablet (25 mg total) by mouth 2 (two) times daily.  Dispense: 90  tablet; Refill: 3  Hypothyroidism, unspecified type -     TSH + free T4  Hyperlipidemia, unspecified hyperlipidemia type -     Lipid panel  IFG (impaired fasting glucose) -     Hemoglobin A1c  Eczema, unspecified type -     Triamcinolone Acetonide; Apply 1 Application topically 2 (two) times daily.  Dispense: 453.6 g; Refill: 3  Essential hypertension, benign Assessment & Plan: Vitals:   06/18/22 1113 06/18/22 1116  BP: (!) 172/93 (!) 160/102    Blood pressure not controlled. Continue taking amlodipine 10 mg, prescribed metoprolol 25 mg. Discussed non pharmacological interventions such as low salt, DASH diet discussed. Educated on stress  reduction and physical activity. Discussed signs and symptoms of major cardiovascular event and need to present to the ED. Follow up in 1 months or sooner if needed with blood pressure reading logs to evaluate trends. Patient verbalizes understanding regarding plan of care and all questions answered.      Return in about 4 weeks (around 07/16/2022) for hypertension.   Renard Hamper Ria Comment, FNP

## 2022-06-18 NOTE — Assessment & Plan Note (Addendum)
Vitals:   06/18/22 1113 06/18/22 1116  BP: (!) 172/93 (!) 160/102    Blood pressure not controlled. Continue taking amlodipine 10 mg, prescribed metoprolol 25 mg. Discussed non pharmacological interventions such as low salt, DASH diet discussed. Educated on stress reduction and physical activity. Discussed signs and symptoms of major cardiovascular event and need to present to the ED. Follow up in 1 months or sooner if needed with blood pressure reading logs to evaluate trends. Patient verbalizes understanding regarding plan of care and all questions answered.

## 2022-06-18 NOTE — Patient Instructions (Signed)
It was pleasure meeting with you today. Please take medications as prescribed. Follow up with your primary health provider if any health concerns arises. If symptoms worsen please contact your primary care provider and/or visit the emergency department.

## 2022-07-02 LAB — CMP14+EGFR
ALT: 33 IU/L — ABNORMAL HIGH (ref 0–32)
AST: 22 IU/L (ref 0–40)
Albumin/Globulin Ratio: 1.2 (ref 1.2–2.2)
Albumin: 3.7 g/dL — ABNORMAL LOW (ref 3.9–4.9)
Alkaline Phosphatase: 124 IU/L — ABNORMAL HIGH (ref 44–121)
BUN/Creatinine Ratio: 14 (ref 9–23)
BUN: 12 mg/dL (ref 6–20)
Bilirubin Total: <0.2 mg/dL (ref 0.0–1.2)
CO2: 22 mmol/L (ref 20–29)
Calcium: 10.5 mg/dL — ABNORMAL HIGH (ref 8.7–10.2)
Chloride: 105 mmol/L (ref 96–106)
Creatinine, Ser: 0.83 mg/dL (ref 0.57–1.00)
Globulin, Total: 3.1 g/dL (ref 1.5–4.5)
Glucose: 90 mg/dL (ref 70–99)
Potassium: 3.7 mmol/L (ref 3.5–5.2)
Sodium: 140 mmol/L (ref 134–144)
Total Protein: 6.8 g/dL (ref 6.0–8.5)
eGFR: 95 mL/min/{1.73_m2} (ref >59)

## 2022-07-02 LAB — CBC WITH DIFFERENTIAL/PLATELET
Basophils Absolute: 0.2 10*3/uL (ref 0.0–0.2)
Basos: 1 %
EOS (ABSOLUTE): 0.4 10*3/uL (ref 0.0–0.4)
Eos: 2 %
Hematocrit: 39.7 % (ref 34.0–46.6)
Hemoglobin: 13.4 g/dL (ref 11.1–15.9)
Immature Grans (Abs): 0.1 10*3/uL (ref 0.0–0.1)
Immature Granulocytes: 1 %
Lymphocytes Absolute: 3.5 10*3/uL — ABNORMAL HIGH (ref 0.7–3.1)
Lymphs: 18 %
MCH: 28.4 pg (ref 26.6–33.0)
MCHC: 33.8 g/dL (ref 31.5–35.7)
MCV: 84 fL (ref 79–97)
Monocytes Absolute: 1.8 10*3/uL — ABNORMAL HIGH (ref 0.1–0.9)
Monocytes: 9 %
Neutrophils Absolute: 13.7 10*3/uL — ABNORMAL HIGH (ref 1.4–7.0)
Neutrophils: 69 %
Platelets: 450 10*3/uL (ref 150–450)
RBC: 4.72 x10E6/uL (ref 3.77–5.28)
RDW: 13.2 % (ref 11.7–15.4)
WBC: 19.6 10*3/uL — ABNORMAL HIGH (ref 3.4–10.8)

## 2022-07-02 LAB — LIPID PANEL
Chol/HDL Ratio: 3.8 ratio (ref 0.0–4.4)
Cholesterol, Total: 122 mg/dL (ref 100–199)
HDL: 32 mg/dL — ABNORMAL LOW (ref >39)
LDL Chol Calc (NIH): 69 mg/dL (ref 0–99)
Triglycerides: 112 mg/dL (ref 0–149)
VLDL Cholesterol Cal: 21 mg/dL (ref 5–40)

## 2022-07-02 LAB — HEMOGLOBIN A1C
Est. average glucose Bld gHb Est-mCnc: 131 mg/dL
Hgb A1c MFr Bld: 6.2 % — ABNORMAL HIGH (ref 4.8–5.6)

## 2022-07-02 LAB — TSH+FREE T4
Free T4: 1.11 ng/dL (ref 0.82–1.77)
TSH: 0.855 u[IU]/mL (ref 0.450–4.500)

## 2022-07-16 ENCOUNTER — Ambulatory Visit: Payer: Medicaid Other | Admitting: Family Medicine

## 2022-07-16 ENCOUNTER — Encounter: Payer: Self-pay | Admitting: Family Medicine

## 2022-07-22 ENCOUNTER — Other Ambulatory Visit: Payer: Self-pay

## 2022-07-22 ENCOUNTER — Emergency Department (HOSPITAL_COMMUNITY): Payer: No Typology Code available for payment source

## 2022-07-22 ENCOUNTER — Encounter (HOSPITAL_COMMUNITY): Payer: Self-pay | Admitting: Emergency Medicine

## 2022-07-22 ENCOUNTER — Emergency Department (HOSPITAL_COMMUNITY)
Admission: EM | Admit: 2022-07-22 | Discharge: 2022-07-22 | Disposition: A | Discharge status: Home / Self Care | Payer: No Typology Code available for payment source | Attending: Emergency Medicine | Admitting: Emergency Medicine

## 2022-07-22 DIAGNOSIS — F332 Major depressive disorder, recurrent severe without psychotic features: Secondary | ICD-10-CM | POA: Insufficient documentation

## 2022-07-22 DIAGNOSIS — F17210 Nicotine dependence, cigarettes, uncomplicated: Secondary | ICD-10-CM | POA: Insufficient documentation

## 2022-07-22 DIAGNOSIS — Z1152 Encounter for screening for COVID-19: Secondary | ICD-10-CM | POA: Insufficient documentation

## 2022-07-22 DIAGNOSIS — F32A Depression, unspecified: Secondary | ICD-10-CM

## 2022-07-22 LAB — RESP PANEL BY RT-PCR (RSV, FLU A&B, COVID)  RVPGX2
Influenza A by PCR: NEGATIVE
Influenza B by PCR: NEGATIVE
Resp Syncytial Virus by PCR: NEGATIVE
SARS Coronavirus 2 by RT PCR: NEGATIVE

## 2022-07-22 LAB — COMPREHENSIVE METABOLIC PANEL
ALT: 23 U/L (ref 0–44)
AST: 19 U/L (ref 15–41)
Albumin: 3.6 g/dL (ref 3.5–5.0)
Alkaline Phosphatase: 97 U/L (ref 38–126)
Anion gap: 8 (ref 5–15)
BUN: 9 mg/dL (ref 6–20)
CO2: 23 mmol/L (ref 22–32)
Calcium: 9.4 mg/dL (ref 8.9–10.3)
Chloride: 107 mmol/L (ref 98–111)
Creatinine, Ser: 0.82 mg/dL (ref 0.44–1.00)
GFR, Estimated: >60 mL/min (ref >60)
Glucose, Bld: 112 mg/dL — ABNORMAL HIGH (ref 70–99)
Potassium: 3.1 mmol/L — ABNORMAL LOW (ref 3.5–5.1)
Sodium: 138 mmol/L (ref 135–145)
Total Bilirubin: 0.7 mg/dL (ref 0.3–1.2)
Total Protein: 7.5 g/dL (ref 6.5–8.1)

## 2022-07-22 LAB — CBC WITH DIFFERENTIAL/PLATELET
Abs Immature Granulocytes: 0.08 10*3/uL — ABNORMAL HIGH (ref 0.00–0.07)
Basophils Absolute: 0.1 10*3/uL (ref 0.0–0.1)
Basophils Relative: 1 %
Eosinophils Absolute: 0.3 10*3/uL (ref 0.0–0.5)
Eosinophils Relative: 2 %
HCT: 40 % (ref 36.0–46.0)
Hemoglobin: 12.8 g/dL (ref 12.0–15.0)
Immature Granulocytes: 0 %
Lymphocytes Relative: 21 %
Lymphs Abs: 4 10*3/uL (ref 0.7–4.0)
MCH: 27.5 pg (ref 26.0–34.0)
MCHC: 32 g/dL (ref 30.0–36.0)
MCV: 86 fL (ref 80.0–100.0)
Monocytes Absolute: 2 10*3/uL — ABNORMAL HIGH (ref 0.1–1.0)
Monocytes Relative: 10 %
Neutro Abs: 12.8 10*3/uL — ABNORMAL HIGH (ref 1.7–7.7)
Neutrophils Relative %: 66 %
Platelets: 377 10*3/uL (ref 150–400)
RBC: 4.65 MIL/uL (ref 3.87–5.11)
RDW: 15.6 % — ABNORMAL HIGH (ref 11.5–15.5)
WBC: 19.3 10*3/uL — ABNORMAL HIGH (ref 4.0–10.5)
nRBC: 0 % (ref 0.0–0.2)

## 2022-07-22 LAB — SALICYLATE LEVEL: Salicylate Lvl: <7 mg/dL — ABNORMAL LOW (ref 7.0–30.0)

## 2022-07-22 LAB — ACETAMINOPHEN LEVEL: Acetaminophen (Tylenol), Serum: <10 ug/mL — ABNORMAL LOW (ref 10–30)

## 2022-07-22 LAB — RAPID URINE DRUG SCREEN, HOSP PERFORMED
Amphetamines: NOT DETECTED
Barbiturates: NOT DETECTED
Benzodiazepines: NOT DETECTED
Cocaine: NOT DETECTED
Opiates: POSITIVE — AB
Tetrahydrocannabinol: NOT DETECTED

## 2022-07-22 LAB — ETHANOL: Alcohol, Ethyl (B): <10 mg/dL (ref <10)

## 2022-07-22 LAB — PREGNANCY, URINE: Preg Test, Ur: NEGATIVE

## 2022-07-22 MED ORDER — POTASSIUM CHLORIDE CRYS ER 20 MEQ PO TBCR
40.0000 meq | EXTENDED_RELEASE_TABLET | Freq: Once | ORAL | Status: AC
  Administered 2022-07-22: 40 meq via ORAL
  Filled 2022-07-22: qty 2

## 2022-07-22 MED ORDER — NICOTINE 21 MG/24HR TD PT24
21.0000 mg | MEDICATED_PATCH | Freq: Every day | TRANSDERMAL | Status: DC
  Administered 2022-07-22: 21 mg via TRANSDERMAL
  Filled 2022-07-22: qty 1
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
## 2022-07-22 NOTE — ED Notes (Signed)
Patient given meal tray.

## 2022-07-22 NOTE — ED Notes (Signed)
One bag of belongings in locker room.
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
## 2022-07-22 NOTE — ED Provider Notes (Signed)
[facility] Provider Note   CSN: [id number] Arrival date & time: 07/22/22  0131     History  Chief Complaint  Patient presents with   Medical Clearance    Norma Nelson is a 37 y.o. female.  37 year old female presents ER today with worsening thoughts of suicidal ideation.  Thinks that she might just take a bunch of pills to kill herself.  Has never attempted suicide in the past.  Does have a history of depression.  She has had multiple life events recently to have her more down.  No hallucinations.  No delusions.  She is seeking help before it gets worse.  No medical problems is not taking medications at home.        Home Medications Prior to Admission medications   Medication Sig Start Date End Date Taking? Authorizing Provider  amLODipine (NORVASC) 10 MG tablet Take 1 tablet (10 mg total) by mouth daily. 12/19/20   Noreene Larsson, NP  EPINEPHrine (EPIPEN 2-PAK) 0.3 mg/0.3 mL IJ SOAJ injection Inject 0.3 mg into the muscle as needed for anaphylaxis. If you use this pen, go to the emergency department immediately. Patient not taking: Reported on 05/07/2022 12/19/20   Noreene Larsson, NP  megestrol (MEGACE) 40 MG tablet Take 1-2 daily on days of heavy bleeding 05/07/22   Derrek Monaco A, NP  metoprolol tartrate (LOPRESSOR) 25 MG tablet Take 1 tablet (25 mg total) by mouth 2 (two) times daily. 06/18/22   Del Eli Hose, FNP  triamcinolone ointment (KENALOG) 0.1 % Apply 1 Application topically 2 (two) times daily. 06/18/22   Del Eli Hose, FNP      Allergies    Amoxicillin, Hydrocodone, Lisinopril, Naproxen, Shellfish allergy, and Tramadol    Review of Systems   Review of Systems  Physical Exam Updated Vital Signs BP (!) 146/87   Pulse 73   Temp 98.3 F (36.8 C) (Oral)   Resp 18   Ht 5\' 11"  (1.803 m)   Wt 127.5 kg   SpO2 99%   BMI 39.20 kg/m  Physical Exam Vitals and nursing note reviewed.   Constitutional:      Appearance: She is well-developed.  HENT:     Head: Normocephalic and atraumatic.  Eyes:     Pupils: Pupils are equal, round, and reactive to light.  Cardiovascular:     Rate and Rhythm: Normal rate and regular rhythm.  Pulmonary:     Effort: No respiratory distress.     Breath sounds: No stridor.  Abdominal:     General: Abdomen is flat. There is no distension.  Musculoskeletal:     Cervical back: Normal range of motion.  Skin:    General: Skin is warm and dry.  Neurological:     General: No focal deficit present.     Mental Status: She is alert.     ED Results / Procedures / Treatments   Labs (all labs ordered are listed, but only abnormal results are displayed) Labs Reviewed  COMPREHENSIVE METABOLIC PANEL - Abnormal; Notable for the following components:      Result Value   Potassium 3.1 (*)    Glucose, Bld 112 (*)    All other components within normal limits  CBC WITH DIFFERENTIAL/PLATELET - Abnormal; Notable for the following components:   WBC 19.3 (*)    RDW 15.6 (*)    Neutro Abs 12.8 (*)    Monocytes Absolute 2.0 (*)    Abs  Immature Granulocytes 0.08 (*)    All other components within normal limits  SALICYLATE LEVEL - Abnormal; Notable for the following components:   Salicylate Lvl [id number] (*)    All other components within normal limits  ACETAMINOPHEN LEVEL - Abnormal; Notable for the following components:   Acetaminophen (Tylenol), Serum <10 (*)    All other components within normal limits  ETHANOL  RAPID URINE DRUG SCREEN, HOSP PERFORMED  POC URINE PREG, ED  POC URINE PREG, ED    EKG None  Radiology No results found.  Procedures Procedures    Medications Ordered in ED Medications  nicotine (NICODERM CQ - dosed in mg/24 hours) patch 21 mg (has no administration in time range)  potassium chloride SA (KLOR-CON M) CR tablet 40 mEq (has no administration in time range)    ED Course/ Medical Decision Making/ A&P                              Medical Decision Making Amount and/or Complexity of Data Reviewed Labs: ordered.  Risk OTC drugs. Prescription drug management.   Medically cleared for TTS consultation. Initial BP high, however improved without intervention. Mild hypokalemia, dose ordered but shouldn't preclude psychiatric recommendations.   Final Clinical Impression(s) / ED Diagnoses Final diagnoses:  None    Rx / DC Orders ED Discharge Orders     None         Alby Schwabe, Corene Cornea, MD 07/22/22 [phone]

## 2022-07-22 NOTE — ED Notes (Signed)
Lab in room.

## 2022-07-22 NOTE — ED Notes (Signed)
Family at bedside. Patient and family member appear to be fighting and patient does seem to be getting agitated during the conversations. RN notified

## 2022-07-22 NOTE — ED Provider Notes (Signed)
Emergency Medicine Observation Re-evaluation Note  Norma Nelson is a 37 y.o. female, seen on rounds today.  Pt initially presented to the ED for complaints of Medical Clearance Currently, the patient is resting comfortably, calm and feeling improved from last night.  Physical Exam  BP (!) 160/91 (BP Location: Left Arm)   Pulse 93   Temp 98.6 F (37 C) (Oral)   Resp 16   Ht 5\' 11"  (1.803 m)   Wt 127.5 kg   SpO2 100%   BMI 39.20 kg/m  Physical Exam Vitals and nursing note reviewed.  Constitutional:      General: She is not in acute distress.    Appearance: She is well-developed.  HENT:     Head: Normocephalic and atraumatic.  Eyes:     Conjunctiva/sclera: Conjunctivae normal.  Cardiovascular:     Rate and Rhythm: Normal rate and regular rhythm.     Heart sounds: No murmur heard. Pulmonary:     Effort: Pulmonary effort is normal. No respiratory distress.     Breath sounds: Normal breath sounds.  Abdominal:     Palpations: Abdomen is soft.     Tenderness: There is no abdominal tenderness.  Musculoskeletal:        General: No swelling.     Cervical back: Neck supple.  Skin:    General: Skin is warm and dry.     Capillary Refill: Capillary refill takes less than 2 seconds.  Neurological:     Mental Status: She is alert.  Psychiatric:        Mood and Affect: Mood normal.    ED Course / MDM  EKG:   I have reviewed the labs performed to date as well as medications administered while in observation.  Recent changes in the last 24 hours include TTS evaluation recommending inpatient psychiatric admission.  I was called back into the room after discussions with the patient's girlfriend.  With information provided from both the patient and her girlfriend, it appears that the patient's presentation last night is consistent with conditional suicidality.  There was an argument with significant other and the patient did not have a place to stay last night.  She states that the  stress of having no place to stay and the argument caused her to come to the emergency department and state that she was going to take pills to kill herself.  Patient has no access to any pills, no access to firearms and currently does not endorse any suicidal ideation.  We did ask TTS to reevaluate the patient in light of this new information and they refused to do this and instead indicated that the medical emergency department physician will need to reevaluate the patient and make the decision whether to admit or discharge the patient.  After extensive discussions, I do believe the patient is at lower risk for suicide given no access to lethal means, conditional suicidality with the conditions prompting her initial presentation since resolved.  After the shared decision-making discussion with the patient, we created a safety plan and that if she has any worsening depression, any suicidal ideation, any homicidal ideation, any hallucinations, she will present to the behavioral health urgent care or return to the emergency department immediately.  Patient then discharged from the emergency department today  Plan  Current plan is for discharge with [facility] follow-up.    Teressa Lower, MD 07/22/22 [phone]

## 2022-07-23 ENCOUNTER — Telehealth: Payer: Self-pay

## 2022-07-23 ENCOUNTER — Ambulatory Visit (INDEPENDENT_AMBULATORY_CARE_PROVIDER_SITE_OTHER): Payer: Medicaid Other | Admitting: Internal Medicine

## 2022-07-23 ENCOUNTER — Encounter: Payer: Self-pay | Admitting: Internal Medicine

## 2022-07-23 DIAGNOSIS — Z91018 Allergy to other foods: Secondary | ICD-10-CM | POA: Diagnosis not present

## 2022-07-23 DIAGNOSIS — I10 Essential (primary) hypertension: Secondary | ICD-10-CM

## 2022-07-23 MED ORDER — EPINEPHRINE 0.3 MG/0.3ML IJ SOAJ: 0.3000 mg | INTRAMUSCULAR | 1 refills | Status: DC | PRN

## 2022-07-23 MED ORDER — METOPROLOL TARTRATE 25 MG PO TABS: 25.0000 mg | ORAL_TABLET | Freq: Two times a day (BID) | ORAL | 0 refills | Status: DC

## 2022-07-23 MED ORDER — AMLODIPINE BESYLATE 10 MG PO TABS: 10.0000 mg | ORAL_TABLET | Freq: Every day | ORAL | 0 refills | Status: DC

## 2022-07-23 NOTE — Transitions of Care (Post Inpatient/ED Visit) (Signed)
   07/23/2022  Name: SHANEECE STOCKBURGER MRN: [id number] DOB: November 26, 1985  Today's TOC FU Call Status: Today's TOC FU Call Status:: Unsuccessul Call (1st Attempt) Unsuccessful Call (1st Attempt) Date: 07/23/22  Attempted to reach the patient regarding the most recent Inpatient/ED visit.  Follow Up Plan: Additional outreach attempts will be made to reach the patient to complete the Transitions of Care (Post Inpatient/ED visit) call.   Signature Agnes Lawrence, CMA (AAMA)  [facility] [phone]

## 2022-07-23 NOTE — Patient Instructions (Signed)
Thank you, [name] for allowing [place] to provide your care today.   Essential hypertension, benign - amLODipine (NORVASC) 10 MG tablet; Take 1 tablet (10 mg total) by mouth daily.  Dispense: 90 tablet; Refill: 0 - metoprolol tartrate (LOPRESSOR) 25 MG tablet; Take 1 tablet (25 mg total) by mouth 2 (two) times daily.  Dispense: 90 tablet; Refill: 0  Food allergy  - EPINEPHrine (EPIPEN 2-PAK) 0.3 mg/0.3 mL IJ SOAJ injection; Inject 0.3 mg into the muscle as needed for anaphylaxis. If you use this pen, go to the emergency department immediately.  Dispense: 1 each; Refill: 1    Reminders: Keep recordings of blood pressure at home. Take ibuprofen 600 mg every 8 hrs for 5 days. Ice your arm. If not improving we can send you for an xray.     Tamsen Snider, M.D.

## 2022-07-23 NOTE — Progress Notes (Unsigned)
   HPI:[name] is a 37 y.o. female who presents for evaluation of Hypertension (Has been out of BP meds since thurs ) . For the details of today's visit, please refer to the assessment and plan.  Physical Exam: Vitals:   07/23/22 1543  BP: (!) 174/99  Pulse: 88  Resp: 16  SpO2: 98%  Weight: 273 lb (123.8 kg)  Height: 5\' 11"  (1.803 m)     Physical Exam Constitutional:      General: She is not in acute distress.    Appearance: She is well-developed.  Eyes:     General: No scleral icterus.    Conjunctiva/sclera: Conjunctivae normal.  Cardiovascular:     Rate and Rhythm: Normal rate and regular rhythm.     Heart sounds: No murmur heard.    No friction rub. No gallop.  Pulmonary:     Effort: Pulmonary effort is normal.     Breath sounds: No wheezing, rhonchi or rales.  Musculoskeletal:     Right lower leg: No edema.     Left lower leg: No edema.  Skin:    General: Skin is warm and dry.      Assessment & Plan:   Norma Nelson was seen today for hypertension.  Primary hypertension Assessment & Plan: Patient's BP today is 174/99 with a goal of <140/80. The patient endorses adherence to her medication regimen, but has run out of medicines. She denied, chest pain, headache, and visual changes. Has BP cuff at home and reports home BP at goal.   Plan: Refilled - amLODipine (NORVASC) 10 MG tablet; Take 1 tablet (10 mg total) by mouth daily.  Dispense: 90 tablet; Refill: 0 - metoprolol tartrate (LOPRESSOR) 25 MG tablet; Take 1 tablet (25 mg total) by mouth 2 (two) times daily.  Dispense: 90 tablet; Refill: 0 - Record BP at home, at least 10 readings and bring to next visit.      Orders: -     amLODIPine Besylate; Take 1 tablet (10 mg total) by mouth daily.  Dispense: 90 tablet; Refill: 0 -     Metoprolol Tartrate; Take 1 tablet (25 mg total) by mouth 2 (two) times daily.  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 Epi pen refilled.  Orders: -     EPINEPHrine; Inject 0.3 mg into the muscle as needed for anaphylaxis. If you use this pen, go to the emergency department immediately.  Dispense: 1 each; Refill: 1      Lorene Dy, MD

## 2022-07-24 NOTE — Assessment & Plan Note (Signed)
Patient's BP today is 174/99 with a goal of <140/80. The patient endorses adherence to her medication regimen, but has run out of medicines. She denied, chest pain, headache, and visual changes. Has BP cuff at home and reports home BP at goal.   Plan: Refilled - amLODipine (NORVASC) 10 MG tablet; Take 1 tablet (10 mg total) by mouth daily.  Dispense: 90 tablet; Refill: 0 - metoprolol tartrate (LOPRESSOR) 25 MG tablet; Take 1 tablet (25 mg total) by mouth 2 (two) times daily.  Dispense: 90 tablet; Refill: 0 - Record BP at home, at least 10 readings and bring to next visit.

## 2022-07-24 NOTE — Assessment & Plan Note (Signed)
Severe allergy to shellfish. Epi pen refilled.

## 2022-07-28 ENCOUNTER — Ambulatory Visit
Admission: EM | Admit: 2022-07-28 | Discharge: 2022-07-28 | Disposition: A | Discharge status: Home / Self Care | Payer: Medicaid Other | Attending: Emergency Medicine | Admitting: Emergency Medicine

## 2022-07-28 DIAGNOSIS — J069 Acute upper respiratory infection, unspecified: Secondary | ICD-10-CM | POA: Diagnosis not present

## 2022-07-28 MED ORDER — CEFDINIR 300 MG PO CAPS: 300.0000 mg | ORAL_CAPSULE | Freq: Two times a day (BID) | ORAL | 0 refills | Status: AC

## 2022-07-28 MED ORDER — PROMETHAZINE-DM 6.25-15 MG/5ML PO SYRP: 5.0000 mL | ORAL_SOLUTION | Freq: Four times a day (QID) | ORAL | 0 refills | Status: DC | PRN

## 2022-07-28 NOTE — Discharge Instructions (Addendum)
As her symptoms have been present for 2 weeks without any signs of resolution begin cefdinir every morning and every evening for 10 days, daily you will see improvement in about 48 hours and  progression from there  May use cough syrup every 6 hours as needed to help manage her symptoms, be mindful this medication will make you feel drowsy  You can take Tylenol and/or Ibuprofen as needed for fever reduction and pain relief.   For cough: honey 1/2 to 1 teaspoon (you can dilute the honey in water or another fluid).  You can also use guaifenesin and dextromethorphan for cough. You can use a humidifier for chest congestion and cough.  If you don't have a humidifier, you can sit in the bathroom with the hot shower running.      For sore throat: try warm salt water gargles, cepacol lozenges, throat spray, warm tea or water with lemon/honey, popsicles or ice, or OTC cold relief medicine for throat discomfort.   For congestion: take a daily anti-histamine like Zyrtec, Claritin, and a oral decongestant, such as pseudoephedrine.  You can also use Flonase 1-2 sprays in each nostril daily.   It is important to stay hydrated: drink plenty of fluids (water, gatorade/powerade/pedialyte, juices, or teas) to keep your throat moisturized and help further relieve irritation/discomfort.

## 2022-07-28 NOTE — ED Provider Notes (Signed)
[facility]    CSN: [id number] Arrival date & time: 07/28/22  0948      History   Chief Complaint No chief complaint on file.   HPI Norma Nelson is a 37 y.o. female.   Patient presents for evaluation of subjective fever, chills, body aches, nasal congestion, rhinorrhea, cough and wheezing present for 2 weeks.  Cough is productive with greenish-yellow sputum.  Wheezing occurring throughout the day but worsening at nighttime.  Symptoms interfering with sleep causing increased fatigue.  History of bronchitis and tobacco use.  Has attempted use of Mucinex which has been ineffective.  No known sick contacts prior.  Tolerating food and liquids.    Past Medical History:  Diagnosis Date   Bronchitis    COVID-19    Eczema    Gonorrhea    Hypertension    Leukocytosis 2012   chronic   Mental disorder    trouble sleeping,   Papanicolaou smear of cervix with positive high risk human papilloma virus (HPV) test 09/09/2016   Trichomonosis     Patient Active Problem List   Diagnosis Date Noted   Breast abscess 05/07/2022   Menorrhagia with regular cycle 05/07/2022   Encounter for gynecological examination with Papanicolaou smear of cervix 05/07/2022   Muscle strain 01/04/2021   Establishing care with new doctor, encounter for 12/19/2020   Breast pain, right 12/19/2020   Food allergy 12/19/2020   Kidney stone 12/19/2020   Eczema 12/19/2020   Papanicolaou smear of cervix with positive high risk human papilloma virus (HPV) test 09/09/2016   Leukocytosis 10/19/2015   Prediabetes 10/19/2015   Primary hypertension 09/14/2015   Morbid obesity 09/14/2015   Cigarette nicotine dependence without complication 09/14/2015    Past Surgical History:  Procedure Laterality Date   NO PAST SURGERIES      OB History     Gravida  0   Para  0   Term  0   Preterm  0   AB  0   Living  0      SAB  0   IAB  0   Ectopic  0   Multiple  0   Live Births  0             Home Medications    Prior to Admission medications   Medication Sig Start Date End Date Taking? Authorizing Provider  amLODipine (NORVASC) 10 MG tablet Take 1 tablet (10 mg total) by mouth daily. 07/23/22   Gardenia Phlegm, MD  EPINEPHrine (EPIPEN 2-PAK) 0.3 mg/0.3 mL IJ SOAJ injection Inject 0.3 mg into the muscle as needed for anaphylaxis. If you use this pen, go to the emergency department immediately. 07/23/22   Gardenia Phlegm, MD  megestrol (MEGACE) 40 MG tablet Take 1-2 daily on days of heavy bleeding 05/07/22   Cyril Mourning A, NP  metoprolol tartrate (LOPRESSOR) 25 MG tablet Take 1 tablet (25 mg total) by mouth 2 (two) times daily. 07/23/22   Gardenia Phlegm, MD  triamcinolone ointment (KENALOG) 0.1 % Apply 1 Application topically 2 (two) times daily. 06/18/22   Del Nigel Berthold, FNP    Family History Family History  Problem Relation Age of Onset   Seizures Mother    Hypertension Mother    Diabetes Mother    Stroke Mother    Heart disease Mother    Cancer Mother        neuroendocrine cancer   Hypertension Maternal Uncle    Hypertension Maternal  Uncle    Hypertension Maternal Uncle    Heart attack Maternal Uncle    Hypertension Maternal Uncle     Social History Social History   Tobacco Use   Smoking status: Every Day    Packs/day: 0.25    Years: 13.00    Additional pack years: 0.00    Total pack years: 3.25    Types: Cigarettes   Smokeless tobacco: Never  Vaping Use   Vaping Use: Never used  Substance Use Topics   Alcohol use: Yes    Comment: occ- <1 drink per week   Drug use: Not Currently    Types: Opium     Allergies   Amoxicillin, Hydrocodone, Lisinopril, Naproxen, Shellfish allergy, and Tramadol   Review of Systems Review of Systems  Constitutional:  Positive for chills and fever. Negative for activity change, appetite change, diaphoresis, fatigue and unexpected weight change.  HENT:  Positive for congestion and rhinorrhea. Negative for  dental problem, drooling, ear discharge, ear pain, facial swelling, hearing loss, mouth sores, nosebleeds, postnasal drip, sinus pressure, sinus pain, sneezing, sore throat, tinnitus, trouble swallowing and voice change.   Respiratory:  Positive for cough and wheezing. Negative for apnea, choking, chest tightness, shortness of breath and stridor.   Cardiovascular: Negative.   Gastrointestinal: Negative.   Musculoskeletal:  Positive for myalgias. Negative for arthralgias, back pain, gait problem, joint swelling, neck pain and neck stiffness.  Skin: Negative.   Neurological: Negative.      Physical Exam Triage Vital Signs ED Triage Vitals  Enc Vitals Group     BP 07/28/22 1014 (!) 164/106     Pulse Rate 07/28/22 1014 67     Resp 07/28/22 1014 20     Temp 07/28/22 1014 98.5 F (36.9 C)     Temp Source 07/28/22 1014 Oral     SpO2 07/28/22 1014 97 %     Weight --      Height --      Head Circumference --      Peak Flow --      Pain Score 07/28/22 1016 8     Pain Loc --      Pain Edu? --      Excl. in GC? --    No data found.  Updated Vital Signs BP (!) 164/106 (BP Location: Right Arm)   Pulse 67   Temp 98.5 F (36.9 C) (Oral)   Resp 20   LMP 07/26/2022   SpO2 97%   Visual Acuity Right Eye Distance:   Left Eye Distance:   Bilateral Distance:    Right Eye Near:   Left Eye Near:    Bilateral Near:     Physical Exam Constitutional:      Appearance: Normal appearance.  HENT:     Head: Normocephalic.     Right Ear: Tympanic membrane, ear canal and external ear normal.     Left Ear: Tympanic membrane, ear canal and external ear normal.     Nose: Congestion present. No rhinorrhea.     Mouth/Throat:     Mouth: Mucous membranes are moist.     Pharynx: Posterior oropharyngeal erythema present.  Cardiovascular:     Rate and Rhythm: Normal rate and regular rhythm.     Pulses: Normal pulses.     Heart sounds: Normal heart sounds.  Pulmonary:     Effort: Pulmonary effort  is normal.     Breath sounds: Normal breath sounds.  Musculoskeletal:     Cervical back: Normal  range of motion and neck supple.  Skin:    General: Skin is warm and dry.  Neurological:     Mental Status: She is alert and oriented to person, place, and time. Mental status is at baseline.  Psychiatric:        Mood and Affect: Mood normal.        Behavior: Behavior normal.      UC Treatments / Results  Labs (all labs ordered are listed, but only abnormal results are displayed) Labs Reviewed - No data to display  EKG   Radiology No results found.  Procedures Procedures (including critical care time)  Medications Ordered in UC Medications - No data to display  Initial Impression / Assessment and Plan / UC Course  I have reviewed the triage vital signs and the nursing notes.  Pertinent labs & imaging results that were available during my care of the patient were reviewed by me and considered in my medical decision making (see chart for details).  Acute upper respiratory illness  Patient is in no signs of distress nor toxic appearing.  Vital signs are stable.  Lungs are clear and O2 saturation 97% on room air, low suspicion for pneumonia, pneumothorax or bronchitis and therefore will defer imaging.  Viral testing deferred due to timeline of illness.  As symptoms have been present for 2 weeks without any signs of resolution we will provide bacterial coverage, prescribed cefdinir and Promethazine DM as cough is most worrisome symptom.May use additional over-the-counter medications as needed for supportive care.  May follow-up with urgent care as needed if symptoms persist or worsen.     Final Clinical Impressions(s) / UC Diagnoses   Final diagnoses:  None     Discharge Instructions      As her symptoms have been present for 2 weeks without any signs of resolution begin cefdinir every morning and every evening for 10 days, daily you will see improvement in about 48 hours and  6 progression from there     You can take Tylenol and/or Ibuprofen as needed for fever reduction and pain relief.   For cough: honey 1/2 to 1 teaspoon (you can dilute the honey in water or another fluid).  You can also use guaifenesin and dextromethorphan for cough. You can use a humidifier for chest congestion and cough.  If you don't have a humidifier, you can sit in the bathroom with the hot shower running.      For sore throat: try warm salt water gargles, cepacol lozenges, throat spray, warm tea or water with lemon/honey, popsicles or ice, or OTC cold relief medicine for throat discomfort.   For congestion: take a daily anti-histamine like Zyrtec, Claritin, and a oral decongestant, such as pseudoephedrine.  You can also use Flonase 1-2 sprays in each nostril daily.   It is important to stay hydrated: drink plenty of fluids (water, gatorade/powerade/pedialyte, juices, or teas) to keep your throat moisturized and help further relieve irritation/discomfort.    ED Prescriptions   None    PDMP not reviewed this encounter.   Valinda Hoar, NP 07/28/22 1050

## 2022-07-28 NOTE — ED Triage Notes (Signed)
Pt reports she is having coughing, congestion, body aches, chills  x 2 weeks. Took mucinex and mucus relief which gave slight relief.

## 2022-08-06 ENCOUNTER — Other Ambulatory Visit (HOSPITAL_COMMUNITY)
Admission: RE | Admit: 2022-08-06 | Discharge: 2022-08-06 | Disposition: A | Discharge status: Home / Self Care | Payer: Medicaid Other | Source: Ambulatory Visit | Attending: Obstetrics & Gynecology | Admitting: Obstetrics & Gynecology

## 2022-08-06 ENCOUNTER — Ambulatory Visit (INDEPENDENT_AMBULATORY_CARE_PROVIDER_SITE_OTHER): Payer: Medicaid Other | Admitting: *Deleted

## 2022-08-06 DIAGNOSIS — Z113 Encounter for screening for infections with a predominantly sexual mode of transmission: Secondary | ICD-10-CM | POA: Diagnosis present

## 2022-08-06 NOTE — Progress Notes (Signed)
   NURSE VISIT- VAGINITIS/STD  SUBJECTIVE:  Norma Nelson is a 37 y.o. G0P0000 GYN patientfemale here for a vaginal swab for vaginitis screening, STD screen.  She reports the following symptoms: vulvar itching for 1  week. Denies abnormal vaginal bleeding, significant pelvic pain, fever, or UTI symptoms. Also requesting bloodwork for HSV testing.  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   OBJECTIVE:  LMP 07/26/2022   Appears well, in no apparent distress  ASSESSMENT: Vaginal swab for vaginitis screening/STD screen  PLAN: Self-collected vaginal probe for Gonorrhea, Chlamydia, Trichomonas, Bacterial Vaginosis, Yeast sent to lab Treatment: to be determined once results are received Follow-up as needed if symptoms persist/worsen, or new symptoms develop  Jobe Marker  08/06/2022 4:05 PM

## 2022-08-07 LAB — HSV 1 AND 2 AB, IGG
HSV 1 Glycoprotein G Ab, IgG: <0.91 index (ref 0.00–0.90)
HSV 2 IgG, Type Spec: <0.91 index (ref 0.00–0.90)

## 2022-08-08 LAB — CERVICOVAGINAL ANCILLARY ONLY
Bacterial Vaginitis (gardnerella): NEGATIVE
Candida Glabrata: NEGATIVE
Candida Vaginitis: NEGATIVE
Chlamydia: NEGATIVE
Comment: NEGATIVE
Comment: NEGATIVE
Comment: NEGATIVE
Comment: NEGATIVE
Comment: NEGATIVE
Comment: NORMAL
Neisseria Gonorrhea: NEGATIVE
Trichomonas: NEGATIVE

## 2022-08-16 ENCOUNTER — Ambulatory Visit (INDEPENDENT_AMBULATORY_CARE_PROVIDER_SITE_OTHER): Payer: Medicaid Other | Admitting: Family Medicine

## 2022-08-16 VITALS — BP 150/88 | HR 87 | Resp 16 | Ht 71.0 in | Wt 275.0 lb

## 2022-08-16 DIAGNOSIS — Z8709 Personal history of other diseases of the respiratory system: Secondary | ICD-10-CM | POA: Diagnosis not present

## 2022-08-16 DIAGNOSIS — J069 Acute upper respiratory infection, unspecified: Secondary | ICD-10-CM

## 2022-08-16 MED ORDER — ALBUTEROL SULFATE HFA 108 (90 BASE) MCG/ACT IN AERS
2.0000 | INHALATION_SPRAY | Freq: Four times a day (QID) | RESPIRATORY_TRACT | 2 refills | Status: DC | PRN
Start: 2022-08-16 — End: 2024-02-13

## 2022-08-16 MED ORDER — BENZONATATE 100 MG PO CAPS
100.0000 mg | ORAL_CAPSULE | Freq: Two times a day (BID) | ORAL | 0 refills | Status: DC | PRN
Start: 2022-08-16 — End: 2022-09-24

## 2022-08-16 MED ORDER — BUDESONIDE-FORMOTEROL FUMARATE 80-4.5 MCG/ACT IN AERO
2.0000 | INHALATION_SPRAY | Freq: Two times a day (BID) | RESPIRATORY_TRACT | 3 refills | Status: DC
Start: 2022-08-16 — End: 2024-02-13

## 2022-08-16 MED ORDER — AZITHROMYCIN 250 MG PO TABS
ORAL_TABLET | ORAL | 0 refills | Status: DC
Start: 2022-08-16 — End: 2022-09-24

## 2022-08-16 MED ORDER — MONTELUKAST SODIUM 10 MG PO TABS
10.0000 mg | ORAL_TABLET | Freq: Every day | ORAL | 3 refills | Status: DC
Start: 2022-08-16 — End: 2023-06-03

## 2022-08-16 MED ORDER — PREDNISONE 20 MG PO TABS: 20.0000 mg | ORAL_TABLET | Freq: Two times a day (BID) | ORAL | 0 refills | Status: AC

## 2022-08-16 NOTE — Patient Instructions (Signed)
        Great to see you today.   - Please take medications as prescribed. - Follow up with your primary health provider if any health concerns arises. - If symptoms worsen please contact your primary care provider and/or visit the emergency department.  

## 2022-08-16 NOTE — Assessment & Plan Note (Signed)
Patient reported not taking asthma medication for years. Started maintenance program Symbicort inhaler 2 puffs twice daily, Montelukast 10 mg daily at bedtime. Explained to patient it's important to use maintenance inhaler even when you have no symptoms Albuterol inhaler PRN use. Encouraged non pharmacological interventions such as avoiding allergens, having SABA inhaler handy at all times. Asthma action plan reviewed and updated. Discussed signs and symptoms of respiratory distress and need to present to the ED. Follow up in 3 months or sooner if needed.Patient/parent verbalizes understanding regarding plan of care and all questions answered.

## 2022-08-16 NOTE — Progress Notes (Signed)
Patient Office Visit   Subjective   Patient ID: Norma Nelson, female    DOB: [birth year]  Age: 37 y.o. MRN: [id number]  CC:  Chief Complaint  Patient presents with   coughing     X 2 months, coughing  and can't taste or smell. Starting 2 days ago her sinus mucus is green and she coughed up bloody mucus from her chest and sometimes it comes from her nose. Was seen in UC on 4/7 and completed a round of antibiotics at that time     HPI Norma Nelson 37 year old female, presents to the clinic for chest congestion and cough for the past 2 months,. She  has a past medical history of Bronchitis, COVID-19, Eczema, Gonorrhea, Hypertension, Leukocytosis (2012), Mental disorder, Papanicolaou smear of cervix with positive high risk human papilloma virus (HPV) test (09/09/2016), and Trichomonosis.  Hx Asthma:  Patient's symptoms include chest tightness, dyspnea, productive cough, and wheezing. Associated symptoms include dyspnea, nasal congestion, productive cough, and wheezing. The patient has been suffering from these symptoms for approximately several years. Symptoms have been gradually worsening since their onset. Medications used in the past to treat these symptoms include beta agonist inhalers. Suspected precipitants include animal dander, cold air, dust, pollens, and strong odors. Patient is awoken from sleep approximately 3 times per night. Patient has not required Emergency Room treatment for these symptoms, and has not required hospitalization. The patient has not been intubated in the past.   URI Patient complains of evaluation of cough. Patient describes symptoms of chest congestion, shortness of breath on exertion, cough, fatigue, malaise, sore throat, sputum production, and wheezing. Symptoms began 2 months ago and are gradually worsening since that time. Patient denies nausea and vomiting. Treatment thus far includes OTC analgesics/antipyretics: somewhat effective Past pulmonary history  is significant for asthma.       Outpatient Encounter Medications as of 08/16/2022  Medication Sig   albuterol (VENTOLIN HFA) 108 (90 Base) MCG/ACT inhaler Inhale 2 puffs into the lungs every 6 (six) hours as needed for wheezing or shortness of breath.   amLODipine (NORVASC) 10 MG tablet Take 1 tablet (10 mg total) by mouth daily.   azithromycin (ZITHROMAX) 250 MG tablet Take 2 tablets on day 1, then 1 tablet daily on days 2 through 5   benzonatate (TESSALON) 100 MG capsule Take 1 capsule (100 mg total) by mouth 2 (two) times daily as needed for cough.   budesonide-formoterol (SYMBICORT) 80-4.5 MCG/ACT inhaler Inhale 2 puffs into the lungs 2 (two) times daily.   EPINEPHrine (EPIPEN 2-PAK) 0.3 mg/0.3 mL IJ SOAJ injection Inject 0.3 mg into the muscle as needed for anaphylaxis. If you use this pen, go to the emergency department immediately.   megestrol (MEGACE) 40 MG tablet Take 1-2 daily on days of heavy bleeding   metoprolol tartrate (LOPRESSOR) 25 MG tablet Take 1 tablet (25 mg total) by mouth 2 (two) times daily.   montelukast (SINGULAIR) 10 MG tablet Take 1 tablet (10 mg total) by mouth at bedtime.   predniSONE (DELTASONE) 20 MG tablet Take 1 tablet (20 mg total) by mouth 2 (two) times daily with breakfast and lunch for 5 days.   triamcinolone ointment (KENALOG) 0.1 % Apply 1 Application topically 2 (two) times daily.   promethazine-dextromethorphan (PROMETHAZINE-DM) 6.25-15 MG/5ML syrup Take 5 mLs by mouth 4 (four) times daily as needed for cough. (Patient not taking: Reported on 08/16/2022)   No facility-administered encounter medications on file as of 08/16/2022.  Past Surgical History:  Procedure Laterality Date   NO PAST SURGERIES      Review of Systems  Constitutional:  Positive for fever. Negative for chills.  Respiratory:  Positive for cough, hemoptysis, sputum production, shortness of breath and wheezing.   Cardiovascular:  Negative for palpitations.  Gastrointestinal:   Negative for abdominal pain.  Genitourinary:  Negative for dysuria.  Musculoskeletal:  Negative for myalgias.  Neurological:  Negative for dizziness and headaches.      Objective    BP (!) 150/88   Pulse 87   Resp 16   Ht 5\' 11"  (1.803 m)   Wt 275 lb (124.7 kg)   LMP 07/26/2022   SpO2 98%   BMI 38.35 kg/m   Physical Exam Vitals reviewed.  Constitutional:      General: She is not in acute distress.    Appearance: Normal appearance. She is not ill-appearing, toxic-appearing or diaphoretic.  HENT:     Head: Normocephalic.  Eyes:     General:        Right eye: No discharge.        Left eye: No discharge.     Conjunctiva/sclera: Conjunctivae normal.  Cardiovascular:     Rate and Rhythm: Normal rate.     Pulses: Normal pulses.     Heart sounds: Normal heart sounds.  Pulmonary:     Effort: Pulmonary effort is normal.     Breath sounds: Wheezing and rhonchi present.  Chest:     Chest wall: Tenderness present.  Musculoskeletal:        General: Normal range of motion.     Cervical back: Normal range of motion.  Skin:    General: Skin is warm and dry.     Capillary Refill: Capillary refill takes less than 2 seconds.  Neurological:     General: No focal deficit present.     Mental Status: She is alert and oriented to person, place, and time.     Coordination: Coordination normal.     Gait: Gait normal.  Psychiatric:        Mood and Affect: Mood normal.        Behavior: Behavior normal.       Assessment & Plan:  Upper respiratory tract infection, unspecified type Assessment & Plan: Azithromycin 250 mg x 5 days. Prednisone 20 mg x 5 days. Benzonatate 100 mg  PRN for cough. Discussed symptomatic treatment, rest, increase oral fluid intake. Take OTC tylenol for fever. Follow-up for worsening or persistent symptoms.Patient verbalizes understanding regarding plan of care and all questions answered   Orders: -     predniSONE; Take 1 tablet (20 mg total) by mouth 2 (two)  times daily with breakfast and lunch for 5 days.  Dispense: 10 tablet; Refill: 0 -     Azithromycin; Take 2 tablets on day 1, then 1 tablet daily on days 2 through 5  Dispense: 6 tablet; Refill: 0 -     Benzonatate; Take 1 capsule (100 mg total) by mouth 2 (two) times daily as needed for cough.  Dispense: 20 capsule; Refill: 0  History of asthma Assessment & Plan: Patient reported not taking asthma medication for years. Started maintenance program Symbicort inhaler 2 puffs twice daily, Montelukast 10 mg daily at bedtime. Explained to patient it's important to use maintenance inhaler even when you have no symptoms Albuterol inhaler PRN use. Encouraged non pharmacological interventions such as avoiding allergens, having SABA inhaler handy at all times. Asthma action plan reviewed and updated.  Discussed signs and symptoms of respiratory distress and need to present to the ED. Follow up in 3 months or sooner if needed.Patient/parent verbalizes understanding regarding plan of care and all questions answered.   Orders: -     Albuterol Sulfate HFA; Inhale 2 puffs into the lungs every 6 (six) hours as needed for wheezing or shortness of breath.  Dispense: 8 g; Refill: 2 -     Budesonide-Formoterol Fumarate; Inhale 2 puffs into the lungs 2 (two) times daily.  Dispense: 1 each; Refill: 3 -     Montelukast Sodium; Take 1 tablet (10 mg total) by mouth at bedtime.  Dispense: 30 tablet; Refill: 3    Return if symptoms worsen or fail to improve.   Cruzita Lederer Newman Nip, FNP

## 2022-08-16 NOTE — Assessment & Plan Note (Signed)
Azithromycin 250 mg x 5 days. Prednisone 20 mg x 5 days. Benzonatate 100 mg  PRN for cough. Discussed symptomatic treatment, rest, increase oral fluid intake. Take OTC tylenol for fever. Follow-up for worsening or persistent symptoms.Patient verbalizes understanding regarding plan of care and all questions answered

## 2022-08-21 NOTE — Transitions of Care (Post Inpatient/ED Visit) (Signed)
   08/21/2022  Name: Norma Nelson MRN: [id number] DOB: [birth year]  Today's TOC FU Call Status: Today's TOC FU Call Status:: Unsuccessul Call (1st Attempt) Unsuccessful Call (1st Attempt) Date: 07/23/22  Attempted to reach the patient regarding the most recent Inpatient/ED visit.  Follow Up Plan: No further outreach attempts will be made at this time. We have been unable to contact the patient. Patient as been seen by PCP already.  Signature Agnes Lawrence, CMA (AAMA)  [facility] [phone]

## 2022-09-11 ENCOUNTER — Ambulatory Visit: Payer: Medicaid Other | Admitting: Orthopaedic Surgery

## 2022-09-12 ENCOUNTER — Other Ambulatory Visit: Payer: Self-pay

## 2022-09-12 DIAGNOSIS — I10 Essential (primary) hypertension: Secondary | ICD-10-CM

## 2022-09-12 MED ORDER — AMLODIPINE BESYLATE 10 MG PO TABS
10.0000 mg | ORAL_TABLET | Freq: Every day | ORAL | 0 refills | Status: DC
Start: 2022-09-12 — End: 2023-09-03

## 2022-09-16 ENCOUNTER — Encounter: Payer: Self-pay | Admitting: Family Medicine

## 2022-09-17 NOTE — Telephone Encounter (Signed)
scheduled

## 2022-09-18 ENCOUNTER — Encounter: Payer: Self-pay | Admitting: Internal Medicine

## 2022-09-18 ENCOUNTER — Ambulatory Visit (INDEPENDENT_AMBULATORY_CARE_PROVIDER_SITE_OTHER): Payer: Medicaid Other | Admitting: Internal Medicine

## 2022-09-18 VITALS — BP 157/89 | HR 85 | Ht 71.0 in | Wt 271.0 lb

## 2022-09-18 DIAGNOSIS — N611 Abscess of the breast and nipple: Secondary | ICD-10-CM | POA: Diagnosis not present

## 2022-09-18 DIAGNOSIS — N61 Mastitis without abscess: Secondary | ICD-10-CM

## 2022-09-18 NOTE — Patient Instructions (Addendum)
Thank you, [name] for allowing [place] to provide your care today.   I am going to refer you to surgery. I am not prescribing antibiotic because you have not found benefit. If your abscess worsens before seeing surgeon then follow up and we should start antibiotic.      Thurmon Fair, M.D.

## 2022-09-18 NOTE — Progress Notes (Signed)
   HPI:[name] is a 37 y.o. female who presents for evaluation right breast abscess. Patient presents with a draining abscess in her right breast. She has been treated with antibiotic previously but reports they did not help. The abscess will drain and then close up. It returns in a few weeks. She has been reading on the internet and is afraid.  Her partner is here and verbalizes frustration with this not being resolved. . For the details of today's visit, please refer to the assessment and plan.  Physical Exam: Vitals:   09/18/22 0902  BP: (!) 157/89  Pulse: 85  SpO2: 96%  Weight: 271 lb (122.9 kg)  Height: 5\' 11"  (1.803 m)     Physical Exam Vitals and nursing note reviewed. Exam conducted with a chaperone present.  Constitutional:      Appearance: She is obese. She is not ill-appearing.  Chest:  Breasts:    Right: Tenderness present. No bleeding or inverted nipple.       Comments: Purulent discharge from open abscess at edge of areola      Assessment & Plan:   Norma Nelson was seen today for concerned spot.  Mastitis -     Ambulatory referral to General Surgery  Breast abscess Assessment & Plan: Reviewed Ultrasound 06/11/22 showing abscess and recommendation by patient Ob/Gyn. Reassured patient she has had appropriate workup for this condition. Patient has found the same outcome with/without antibiotic treatment. So not antibiotic prescribed today. Recommended following up if worsening before her surgical appointment. Abscess is draining.  Contacted general surgery and this is in their scope of care. Referral placed.        Milus Banister, MD

## 2022-09-19 ENCOUNTER — Encounter: Payer: Self-pay | Admitting: Internal Medicine

## 2022-09-19 NOTE — Assessment & Plan Note (Addendum)
Reviewed Ultrasound 06/11/22 showing abscess and recommendation by patient Ob/Gyn. Reassured patient she has had appropriate workup for this condition. Patient has found the same outcome with/without antibiotic treatment. So not antibiotic prescribed today. Recommended following up if worsening before her surgical appointment. Abscess is draining.  Contacted general surgery and this is in their scope of care. Referral placed.

## 2022-09-22 IMAGING — CT CT RENAL STONE PROTOCOL
2 of 4 series · 16 of 46 positions shown, 18 images · non-contrast
Comparison: None.

CLINICAL DATA: Flank pain, kidney stone suspected

EXAM:
CT ABDOMEN AND PELVIS WITHOUT CONTRAST
TECHNIQUE: Multidetector CT imaging of the abdomen and pelvis was performed
following the standard protocol without IV contrast.

[Series 2: axial st · axial · 0.82mm/px · z∈[+889,+1289]mm · 13 of 92 slices shown, 15 images]
[im 6/92  soft-tissue]
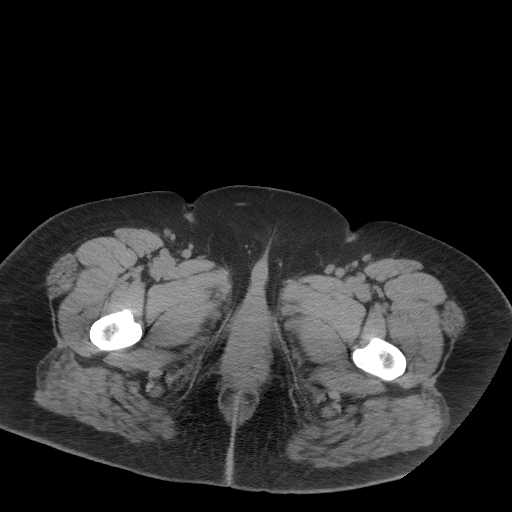
[im 6/92  bone]
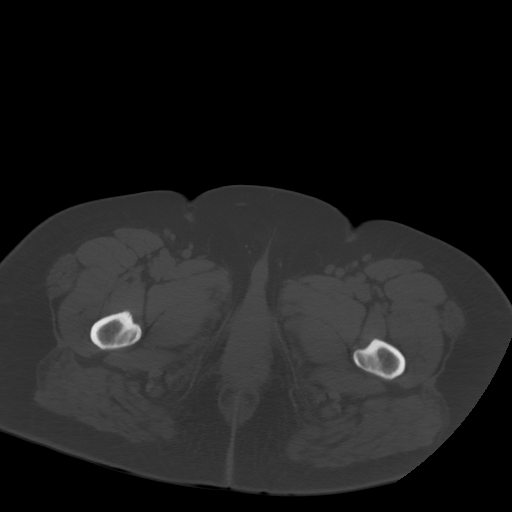
[im 12/92  soft-tissue]
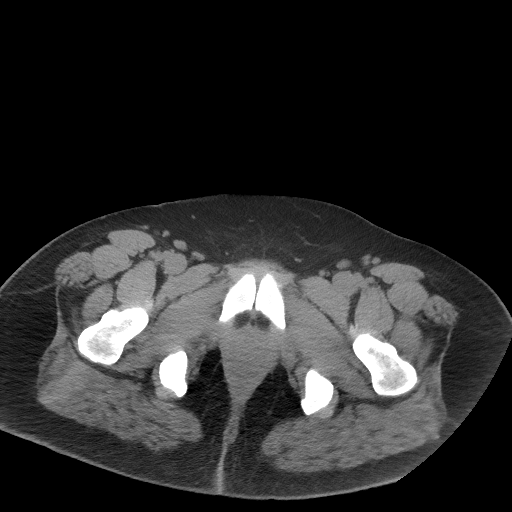
[im 18/92  soft-tissue]
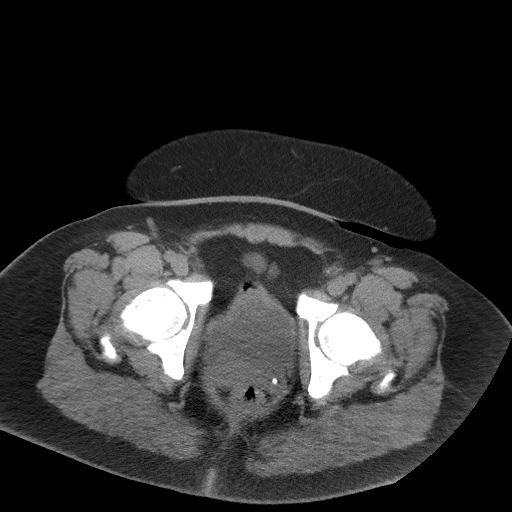
[im 29/92  soft-tissue]
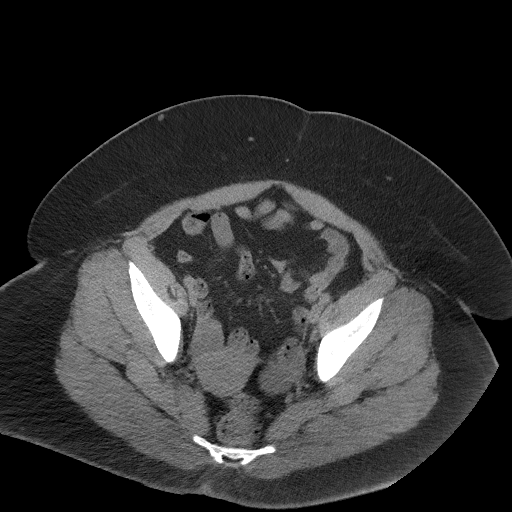
[im 35/92  soft-tissue]
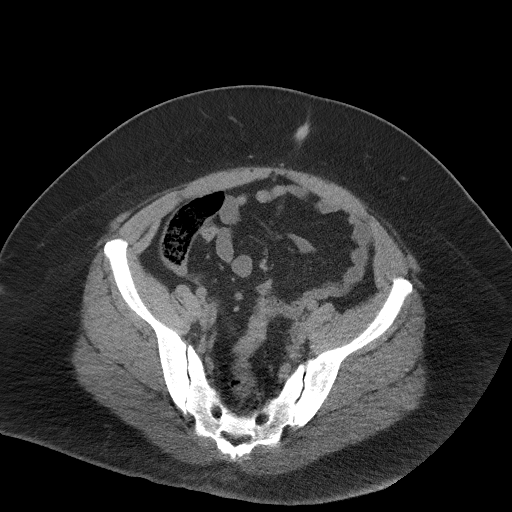
[im 40/92  soft-tissue]
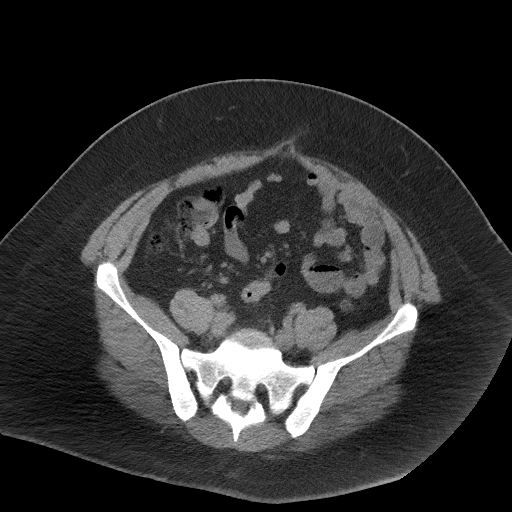
[im 46/92  soft-tissue]
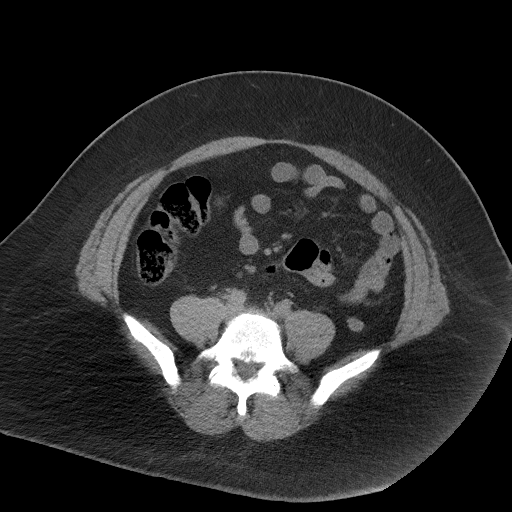
[im 52/92  soft-tissue]
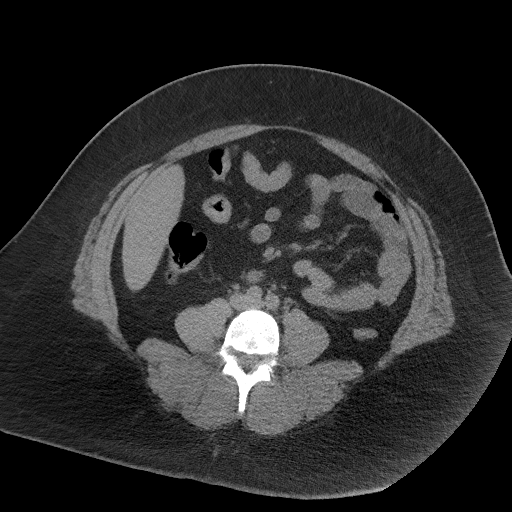
[im 57/92  soft-tissue]
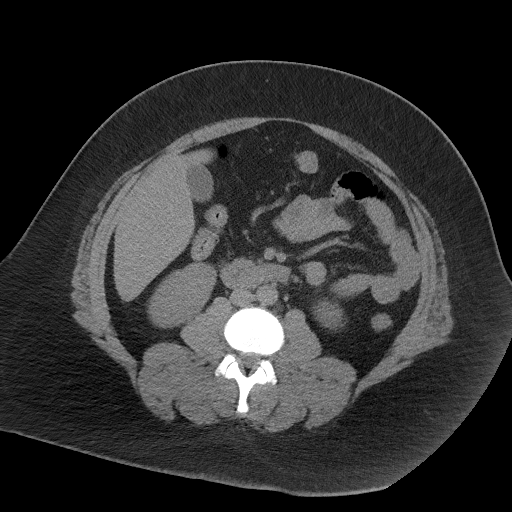
[im 57/92  bone]
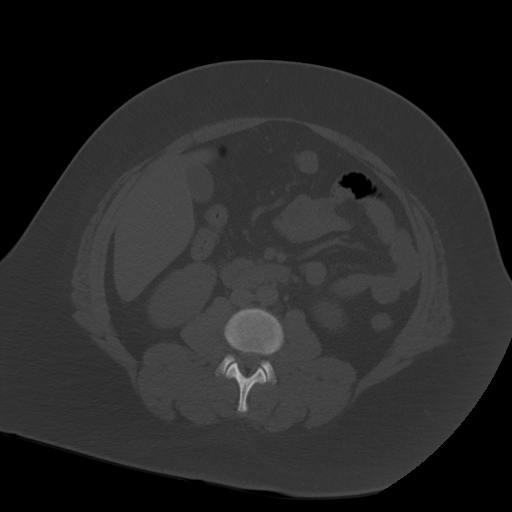
[im 63/92  soft-tissue]
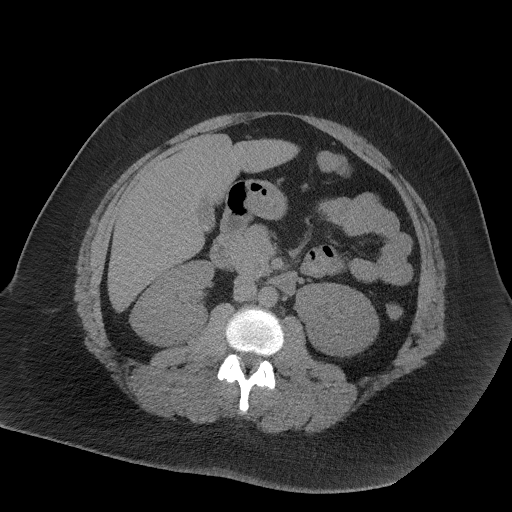
[im 74/92  soft-tissue]
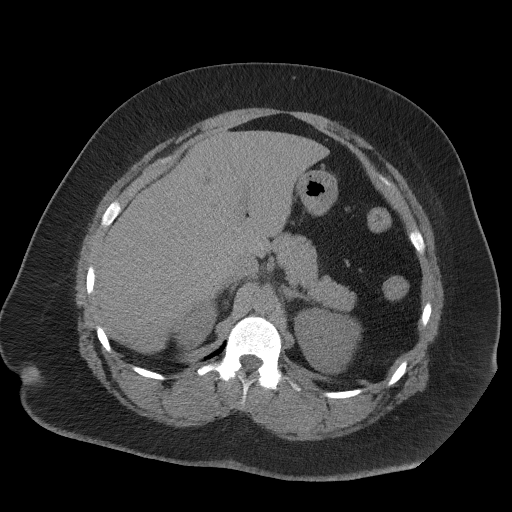
[im 80/92  soft-tissue]
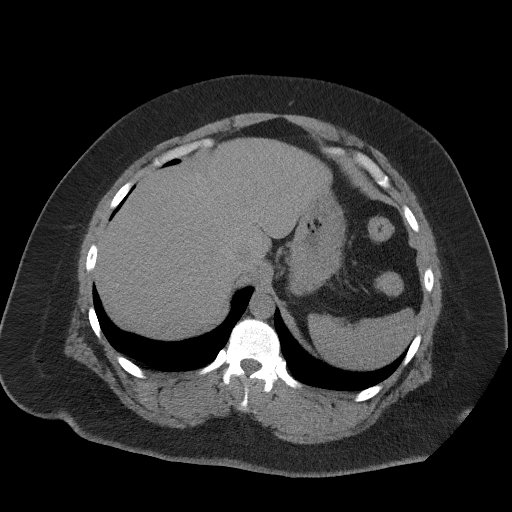
[im 86/92  soft-tissue]
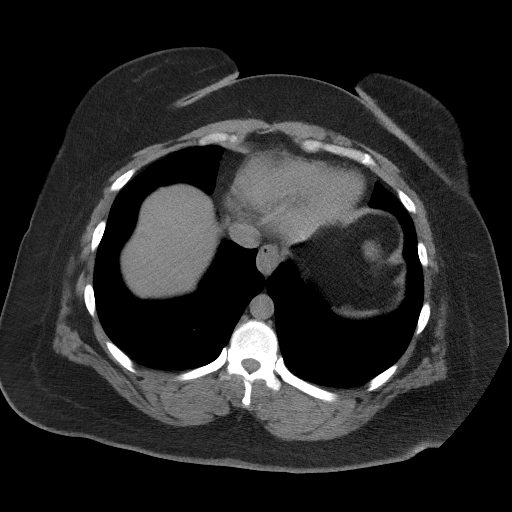

[Series 5: coronal st · coronal · 0.81mm/px · 3 of 127 slices shown]
[im 43/127  soft-tissue]
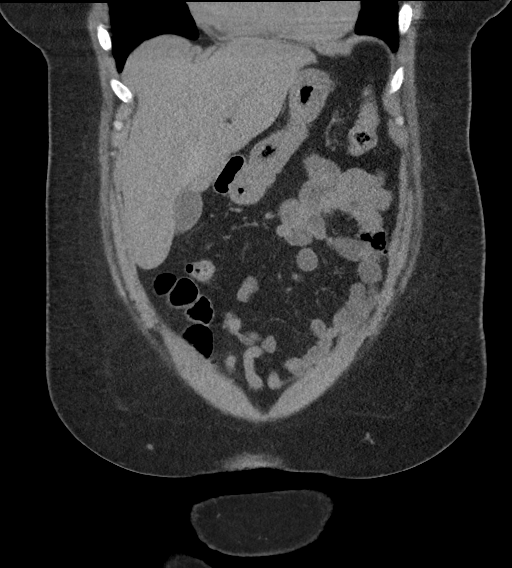
[im 57/127  soft-tissue]
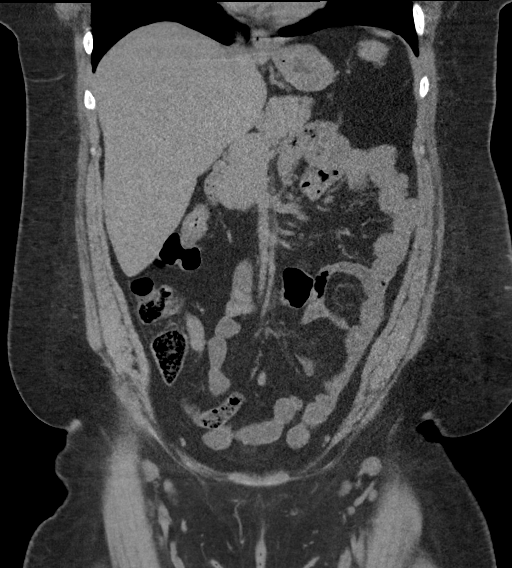
[im 71/127  soft-tissue]
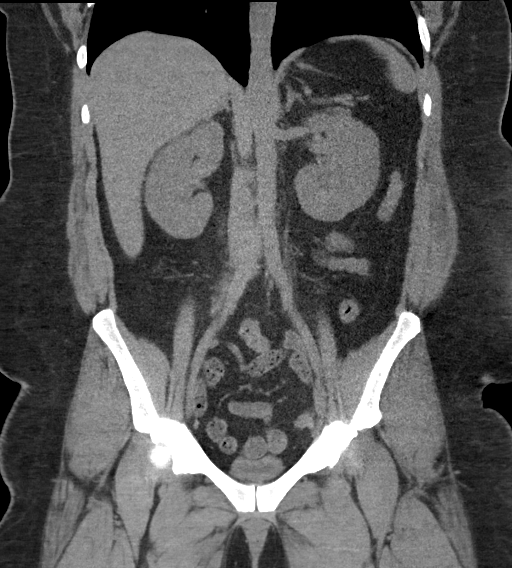

[16 of 46 positions shown; findings below may reference images not displayed]

FINDINGS: Lower chest: No acute abnormality.

Hepatobiliary: No focal liver abnormality is seen. Cholelithiasis.
No cholecystitis.

Pancreas: Unremarkable. No pancreatic ductal dilatation or
surrounding inflammatory changes.

Spleen: Normal in size without focal abnormality.

Adrenals/Urinary Tract: Adrenal glands are unremarkable. No
hydronephrosis. There is a punctate nonobstructive 1 mm stone in the
right mid kidney and a 4 mm renal calculus in the lower pole of the
left kidney. Bladder is unremarkable.

Stomach/Bowel: Stomach is within normal limits. Appendix appears
normal. No evidence of bowel wall thickening, distention, or
inflammatory changes.

Vascular/Lymphatic: No significant vascular findings are present. No
enlarged abdominal or pelvic lymph nodes.

Reproductive: There is a 3.0 cm likely dominant follicle in the left
adnexa.

Other: No abdominal wall hernia or abnormality. No abdominopelvic
ascites.

Musculoskeletal: No acute osseous abnormality. No suspicious lytic
or blastic lesions. Mild bilateral hip degenerative changes.
IMPRESSION: Punctate nonobstructive, 1 mm stone in the right mid kidney and a 4
mm renal stone in the lower pole the left kidney. No evidence of
obstructive uropathy.

Cholelithiasis.  No evidence of cholecystitis.

Normal appendix.

## 2022-09-24 ENCOUNTER — Encounter: Payer: Self-pay | Admitting: General Surgery

## 2022-09-24 ENCOUNTER — Ambulatory Visit (INDEPENDENT_AMBULATORY_CARE_PROVIDER_SITE_OTHER): Payer: Medicaid Other | Admitting: General Surgery

## 2022-09-24 VITALS — BP 127/85 | HR 64 | Temp 98.5°F | Resp 16 | Ht 71.0 in | Wt 275.0 lb

## 2022-09-24 DIAGNOSIS — N611 Abscess of the breast and nipple: Secondary | ICD-10-CM

## 2022-09-24 NOTE — Progress Notes (Signed)
[facility] History and Physical  Reason for Referral:*** Referring Physician: ***  Chief Complaint   New Patient (Initial Visit)     Norma Nelson is a 37 y.o. female.  HPI: ***.  The *** started *** and has had a duration of ***.  It is associated with ***.  The *** is improved with ***, and is made worse with ***.    Quality*** Context***  Past Medical History:  Diagnosis Date  . Bronchitis   . COVID-19   . Eczema   . Gonorrhea   . Hypertension   . Leukocytosis 2012   chronic  . Mental disorder    trouble sleeping,  . Papanicolaou smear of cervix with positive high risk human papilloma virus (HPV) test 09/09/2016  . Trichomonosis     Past Surgical History:  Procedure Laterality Date  . NO PAST SURGERIES      Family History  Problem Relation Age of Onset  . Seizures Mother   . Hypertension Mother   . Diabetes Mother   . Stroke Mother   . Heart disease Mother   . Cancer Mother        neuroendocrine cancer  . Hypertension Maternal Uncle   . Hypertension Maternal Uncle   . Hypertension Maternal Uncle   . Heart attack Maternal Uncle   . Hypertension Maternal Uncle     Social History   Tobacco Use  . Smoking status: Every Day    Packs/day: 0.25    Years: 13.00    Additional pack years: 0.00    Total pack years: 3.25    Types: Cigarettes  . Smokeless tobacco: Never  Vaping Use  . Vaping Use: Never used  Substance Use Topics  . Alcohol use: Yes    Comment: occ- <1 drink per week  . Drug use: Not Currently    Types: Opium    Medications: {medication reviewed/display:3041432} Allergies as of 09/24/2022       Reactions   Amoxicillin Hives   Hydrocodone Nausea And Vomiting   Lisinopril Other (See Comments)   "takes all fluid off her body"   Naproxen Nausea And Vomiting   Shellfish Allergy Itching   Tramadol    "bleed"        Medication List        Accurate as of September 24, 2022 10:27 AM. If you have any questions, ask  your nurse or doctor.          STOP taking these medications    azithromycin 250 MG tablet Commonly known as: ZITHROMAX Stopped by: [name], MD   benzonatate 100 MG capsule Commonly known as: TESSALON Stopped by: [name], MD       TAKE these medications    albuterol 108 (90 Base) MCG/ACT inhaler Commonly known as: VENTOLIN HFA Inhale 2 puffs into the lungs every 6 (six) hours as needed for wheezing or shortness of breath.   amLODipine 10 MG tablet Commonly known as: NORVASC Take 1 tablet (10 mg total) by mouth daily.   budesonide-formoterol 80-4.5 MCG/ACT inhaler Commonly known as: SYMBICORT Inhale 2 puffs into the lungs 2 (two) times daily.   EPINEPHrine 0.3 mg/0.3 mL Soaj injection Commonly known as: EpiPen 2-Pak Inject 0.3 mg into the muscle as needed for anaphylaxis. If you use this pen, go to the emergency department immediately.   megestrol 40 MG tablet Commonly known as: MEGACE Take 1-2 daily on days of heavy bleeding   metoprolol tartrate 25 MG tablet  Commonly known as: LOPRESSOR Take 1 tablet (25 mg total) by mouth 2 (two) times daily.   montelukast 10 MG tablet Commonly known as: SINGULAIR Take 1 tablet (10 mg total) by mouth at bedtime.   promethazine-dextromethorphan 6.25-15 MG/5ML syrup Commonly known as: PROMETHAZINE-DM Take 5 mLs by mouth 4 (four) times daily as needed for cough.   triamcinolone ointment 0.1 % Commonly known as: KENALOG Apply 1 Application topically 2 (two) times daily.         ROS:  {Review of Systems:30496}  Blood pressure 127/85, pulse 64, temperature 98.5 F (36.9 C), temperature source Oral, resp. rate 16, height 5\' 11"  (1.803 m), weight 275 lb (124.7 kg), SpO2 97 %. Physical Exam  Results: No results found for this or any previous visit (from the past 48 hour(s)).  No results found.   Assessment & Plan:  [name] is a 37 y.o. female with *** -*** -*** -Follow up ***  All  questions were answered to the satisfaction of the patient and family***.  The risk and benefits of *** were discussed including but not limited to ***.  After careful consideration, [name] has decided to ***.    [name] 09/24/2022, 10:27 AM

## 2022-09-24 NOTE — Patient Instructions (Signed)
Surgical Breast Biopsy A breast biopsy is a procedure where a small piece (sample) of breast tissue is removed from the breast and tested for cancer (malignant) cells. In a surgical or open breast biopsy, the tissue sample is removed from the changed area in the breast. This abnormal tissue is found by either feeling (palpating) it or with the help of imaging studies such as mammogram, ultrasound, or MRI. One of two methods is used: In an incisional biopsy, part of the abnormal area is removed. In an excisional biopsy, all of the abnormal area is removed. A surgical breast biopsy lets your health care provider either remove the whole mass or take out a larger amount of breast tissue for testing than breast biopsies done with needles. You may need a surgical breast biopsy if you have: Abnormalities, like lumps or masses, seen on breast imaging tests. Redness, swelling, or dimpling of the breast, or pain in the breast. Nipple changes, such as crusting, sores, or abnormal bleeding or other discharge. Already had a different type of breast biopsy and the results were unclear. Had another type of breast biopsy, but there was not enough tissue for testing. If a breast biopsy shows that the change is cancer, the information from the biopsy can help in choosing the best type of treatment. There are many different types of breast biopsies. Talk with your health care provider about your options and which type is best for you. Tell your health care provider about: Any allergies you have. All medicines you are taking, including vitamins, herbs, eye drops, creams, and over-the-counter medicines. Any problems you or family members have had with anesthetic medicines. Any bleeding problems you have. Any medical conditions you have. Any surgeries you have had. Whether you are pregnant or may be pregnant. Whether you have a cardiac pacemaker or other electronic device implanted in your body, if the biopsy will be  done using an MRI. What are the risks? Generally, this is a safe procedure. However, problems may occur, including: Infection. Bleeding. Allergic reactions to medicines. Damage to other tissues. What happens before the procedure? Medicines Ask your health care provider about: Changing or stopping your regular medicines. This is especially important if you are taking diabetes medicines or blood thinners. Taking medicines such as aspirin and ibuprofen. These medicines can thin your blood. Do not take these medicines unless your health care provider tells you to take them. Taking over-the-counter medicines, vitamins, herbs, and supplements. Lifestyle Do not use any products that contain nicotine or tobacco before the procedure. These products include cigarettes, chewing tobacco, and vaping devices, such as e-cigarettes. If you need help quitting, ask your health care provider. Do not drink alcohol for 24 hours before your procedure. General instructions If the tissue to be biopsied cannot be easily found by feeling (palpating) the breast, you may have a procedure before the surgery to find and mark the area (localization). This is done to guide your surgeon to the tissue that needs to be removed. It may be done with: Imaging, such as a mammogram, ultrasound, or MRI. Placement of a small wire, clip, or an implant that will reflect a radar signal. Do not put on antiperspirants or deodorants the day of the procedure. These can cause white spots to show on X-ray images. Wear a good support bra to the procedure. You will be asked to remove certain items, such as jewelry and dentures, that might cause problems with the X-ray images. Ask your health care provider: How your  surgery site will be marked. What steps will be taken to help prevent infection. These may include: Washing skin with a germ-killing soap. Taking antibiotic medicine. If you will be going home right after the procedure, plan to  have a responsible adult: Take you home from the hospital or clinic. You will not be allowed to drive. Care for you for the time you are told. What happens during the procedure?  An IV will be put into one of the veins in your arm or hand. You may be given one or more of the following: A medicine to help you relax (sedative). A medicine to numb the breast area (local anesthetic). A medicine to make you fall asleep (general anesthetic). A cut (incision) will be made in your breast. The abnormal area will be found. Then, either part or all of that area will be removed. If all of the changed tissue will be removed, an edge of normal breast tissue around it may also be removed. If localization was done, the wire, clip, or implant that was placed will also be removed. The incision will be stitched (sutured) or taped and covered with a bandage (dressing). Your health care provider may apply: A pressure dressing. This is a bandage that is wrapped tightly around your chest. An ice pack to help prevent bleeding and swelling in the breast. The procedure may vary among health care providers and hospitals. What happens after the procedure? You will be taken to the recovery area. If you are doing well and have no problems, you will be allowed to go home. You may be told to wear a supportive bra for some time. You may be given pain medicine as needed. If you were given a sedative during the procedure, it can affect you for several hours. Do not drive or operate machinery until your health care provider says that it is safe. It is up to you to get the results of your procedure. Ask your health care provider, or the department that is doing the procedure, when your results will be ready and how you will get them. Summary A surgical breast biopsy is a procedure to surgically remove and test part (incisional biopsy) or all (excisional biopsy) of an abnormal area of tissue found in the breast. Generally, this  is a safe procedure, but problems can occur. Risks include infection, bleeding, allergic reaction, or damage to other tissues. Ask your health care provider about any changes you may need to make to your medicines before the procedure. Plan to have a responsible adult take you home from the hospital or clinic. This information is not intended to replace advice given to you by your health care provider. Make sure you discuss any questions you have with your health care provider. Document Revised: 03/19/2021 Document Reviewed: 03/19/2021 Elsevier Patient Education  2024 [employer].

## 2022-09-25 NOTE — H&P (Signed)
[facility] History and Physical   Reason for Referral: Recurrent right breast abscess  Referring Physician: Dr. Barbaraann Faster    Chief Complaint   New Patient (Initial Visit)        Norma Nelson is a 37 y.o. female.  HPI: Norma Nelson is a 37 yo who is here today with her spouse and reports that she has had 5 years of abscesses on the right medial breast . The area swells and becomes inflamed and then opens up and drains purulence. She has extreme tenderness in this area and then eventually into her axilla when she gets these infections. She has been on antibiotics in the past but no prior drainage reported. She has had a US/ Mammogram that demonstrated a 1.2 cm collection that was superficial and nothing else concerning.      She is frustrated and wants to do something about this issue. She does smoke daily.        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   Mental disorder      trouble sleeping,   Papanicolaou smear of cervix with positive high risk human papilloma virus (HPV) test 09/09/2016   Trichomonosis             Past Surgical History:  Procedure Laterality Date   NO PAST SURGERIES               Family History  Problem Relation Age of Onset   Seizures Mother     Hypertension Mother     Diabetes Mother     Stroke Mother     Heart disease Mother     Cancer Mother          neuroendocrine cancer   Hypertension Maternal Uncle     Hypertension Maternal Uncle     Hypertension Maternal Uncle     Heart attack Maternal Uncle     Hypertension Maternal Uncle        Social History         Tobacco Use   Smoking status: Every Day      Packs/day: 0.25      Years: 13.00      Additional pack years: 0.00      Total pack years: 3.25      Types: Cigarettes   Smokeless tobacco: Never  Vaping Use   Vaping Use: Never used  Substance Use Topics    Alcohol use: Yes      Comment: occ- <1 drink per week   Drug use: Not Currently      Types: Opium      Medications: I have reviewed the patient's current medications. Allergies as of 09/24/2022         Reactions    Amoxicillin Hives    Hydrocodone Nausea And Vomiting    Lisinopril Other (See Comments)    "takes all fluid off her body"    Naproxen Nausea And Vomiting    Shellfish Allergy Itching    Tramadol      "bleed"            Medication List           Accurate as of September 24, 2022 10:27 AM. If you have any questions, ask your nurse or doctor.  STOP taking these medications     azithromycin 250 MG tablet Commonly known as: ZITHROMAX Stopped by: [name], MD    benzonatate 100 MG capsule Commonly known as: TESSALON Stopped by: [name], MD           TAKE these medications     albuterol 108 (90 Base) MCG/ACT inhaler Commonly known as: VENTOLIN HFA Inhale 2 puffs into the lungs every 6 (six) hours as needed for wheezing or shortness of breath.    amLODipine 10 MG tablet Commonly known as: NORVASC Take 1 tablet (10 mg total) by mouth daily.    budesonide-formoterol 80-4.5 MCG/ACT inhaler Commonly known as: SYMBICORT Inhale 2 puffs into the lungs 2 (two) times daily.    EPINEPHrine 0.3 mg/0.3 mL Soaj injection Commonly known as: EpiPen 2-Pak Inject 0.3 mg into the muscle as needed for anaphylaxis. If you use this pen, go to the emergency department immediately.    megestrol 40 MG tablet Commonly known as: MEGACE Take 1-2 daily on days of heavy bleeding    metoprolol tartrate 25 MG tablet Commonly known as: LOPRESSOR Take 1 tablet (25 mg total) by mouth 2 (two) times daily.    montelukast 10 MG tablet Commonly known as: SINGULAIR Take 1 tablet (10 mg total) by mouth at bedtime.    promethazine-dextromethorphan 6.25-15 MG/5ML syrup Commonly known as: PROMETHAZINE-DM Take 5 mLs by mouth 4 (four) times daily as needed  for cough.    triamcinolone ointment 0.1 % Commonly known as: KENALOG Apply 1 Application topically 2 (two) times daily.               ROS:  A comprehensive review of systems was negative except for: Respiratory: positive for cough, SOB Integument/breast: positive for breast tenderness and breast drainage   Blood pressure 127/85, pulse 64, temperature 98.5 F (36.9 C), temperature source Oral, resp. rate 16, height 5\' 11"  (1.803 m), weight 275 lb (124.7 kg), SpO2 97 %. Physical Exam HENT:     Head: Normocephalic.     Nose: Nose normal.  Eyes:     Extraocular Movements: Extraocular movements intact.  Cardiovascular:     Rate and Rhythm: Normal rate and regular rhythm.  Pulmonary:     Effort: Pulmonary effort is normal.     Breath sounds: Normal breath sounds.  Chest:     Comments: Right breast with small opening near edge of areola, no obvious drainage currently, some tenderness, no induration, no erythema, tender in the axilla  Abdominal:     General: There is no distension.     Palpations: Abdomen is soft.     Tenderness: There is no abdominal tenderness.  Musculoskeletal:        General: Normal range of motion.  Lymphadenopathy:     Upper Body:     Right upper body: No axillary adenopathy.  Skin:    General: Skin is warm.  Neurological:     General: No focal deficit present.     Mental Status: She is alert and oriented to person, place, and time.  Psychiatric:        Mood and Affect: Mood normal.        Behavior: Behavior normal.        Thought Content: Thought content normal.        Judgment: Judgment normal.        Results: Personally reviewed- superficial collection CLINICAL DATA:  Patient presents with recurring symptoms of right breast mastitis, which she has had  in the same retroareolar location on and off for a year. She has been on and off antibiotics. Has had some drainage from the nipple region from the current infection. Patient's referring  clinician has recently started her on antibiotics, with the patient having some interval improvement. She also reports some soreness in the right axilla.   EXAM: DIGITAL DIAGNOSTIC BILATERAL MAMMOGRAM WITH TOMOSYNTHESIS; ULTRASOUND RIGHT BREAST LIMITED   TECHNIQUE: Bilateral digital diagnostic mammography and breast tomosynthesis was performed.; Targeted ultrasound examination of the right breast was performed   COMPARISON:  Previous exam(s).   ACR Breast Density Category a: The breasts are almost entirely fatty.   FINDINGS: There is retroareolar thickening on the right, similar to prior studies. There is no breast mass, no and significant asymmetry, no architectural distortion and no suspicious calcifications.   On physical exam, focal superficial area of scanned thickening and tenderness medial to the right nipple.   Targeted ultrasound is performed, showing thickening of the nipple 3 o'clock location, with a central hypoechoic irregular area consistent with small complex fluid collection, which extends to the skin surface, small collection measuring approximately 1.2 x 0.9 x 1.2 cm. Imaging of the right axilla demonstrates several prominent lymph nodes, cortical thickness is up to 5 mm, nodes maintain normal overall morphology.   IMPRESSION: 1. No evidence of breast malignancy. 2. Right mastitis involving the medial aspect the nipple, with a small, 1.2 cm, collection. Mild reactive right axillary lymphadenopathy.   RECOMMENDATION: 1. Continue current course of antibiotic therapy. 2. If symptoms persist, or worsen or recur, recommend surgical consultation since this is a recurring specific area of mastitis. 3. Screening mammogram at age 8 unless there are persistent or intervening clinical concerns. (Code:SM-B-40A)   I have discussed the findings and recommendations with the patient. If applicable, a reminder letter will be sent to the patient regarding the next  appointment.   BI-RADS CATEGORY  2: Benign.     Electronically Signed   By: Amie Portland M.D.   On: 06/11/2022 11:50     Assessment & Plan:  Norma Nelson is a 37 y.o. female with recurrent infections on the medial side of her right breast near the areola. Discussed excision of the area and risk of bleeding, infection, recurrence of the abscess/ infection due to her smoking. Discussed her trying to reduce her smoking.  Discussed unlikelihood of her having anything malignant as her mammogram and [place] were benign. Discussed sending pathology to be sure. She is worried about the area.      All questions were answered to the satisfaction of the patient and family.     [name] 09/24/2022, 10:27 AM

## 2022-09-25 NOTE — Patient Instructions (Signed)
Norma Nelson  09/25/2022     @PREFPERIOPPHARMACY @   Your procedure is scheduled on  09/27/2022.   Report to [facility] at  0600  A.M.   Call this number if you have problems the morning of surgery:  [phone]  If you experience any cold or flu symptoms such as cough, fever, chills, shortness of breath, etc. between now and your scheduled surgery, please notify us at the above number.   Remember:  Do not eat or drink after midnight.        Use your inhalers before you come and bring your rescue inhaler with you.     Take these medicines the morning of surgery with A SIP OF WATER                               amlodipine, metoprolol.     Do not wear jewelry, make-up or nail polish, including gel polish,  artificial nails, or any other type of covering on natural nails (fingers and  toes).  Do not wear lotions, powders, or perfumes, or deodorant.  Do not shave 48 hours prior to surgery.  Men may shave face and neck.  Do not bring valuables to the hospital.  [facility] is not responsible for any belongings or valuables.  Contacts, dentures or bridgework may not be worn into surgery.  Leave your suitcase in the car.  After surgery it may be brought to your room.  For patients admitted to the hospital, discharge time will be determined by your treatment team.  Patients discharged the day of surgery will not be allowed to drive home and must have someone with them for 24 hours.    Special instructions:   DO NOT smoke tobacco or vape for 24 hours before your procedure.  Please read over the following fact sheets that you were given. Coughing and Deep Breathing, Surgical Site Infection Prevention, Anesthesia Post-op Instructions, and Care and Recovery After Surgery       Breast Biopsy, Care After The following information offers guidance on how to care for yourself after your breast biopsy. Your doctor may also give you more specific instructions. If you  have problems or questions, contact your doctor. What can I expect after the procedure? After a breast biopsy, it is common to have: Bruising on your breast. Breast swelling. Numbness, tingling, or pain near your biopsy site. This site is where tissue was taken out for study. Follow these instructions at home: Medicines Take over-the-counter and prescription medicines only as told by your doctor. If you were given a sedative during your procedure, do not drive or use machines until your doctor says that it is safe. A sedative is a medicine that helps you relax. Do not drink alcohol while taking pain medicine. Ask your doctor if you should avoid driving or using machines while you are taking your medicine. Biopsy site care     Follow instructions from your doctor about how to take care of your cut from surgery (incision) or your puncture site. Make sure you: Wash your hands with soap and water for at least 20 seconds before and after you change your bandage. If you cannot use soap and water, use hand sanitizer. Change your bandage. Leave stitches or skin glue in place for at least 2 weeks. Leave tape strips alone unless you are told to take them off. You may  trim the edges of the tape strips if they curl up. If you have stitches, keep them dry when you take a bath or a shower. Check your cut or puncture site every day for signs of infection. Look for: More redness, swelling, or pain. More fluid or blood. Warmth. Pus or a bad smell. Protect the biopsy site. Do not let the site get bumped. Managing pain If told, put ice on the biopsy site. To do this: Put ice in a plastic bag. Place a towel between your skin and the bag. Leave the ice on for 20 minutes, 2-3 times a day. Take off the ice if your skin turns bright red. This is very important. If you cannot feel pain, heat, or cold, you have a greater risk of damage to the area. Activity If a cut was made in your skin to do the biopsy,  avoid activities that could pull your cut open. These include: Stretching. Reaching over your head. Exercise. Sports. Lifting anything that weighs more than 3 lb (1.4 kg). Return to your normal activities when your doctor says that it is safe. General instructions Follow your normal diet. Wear a good support bra for as long as told by your doctor. Get checked for extra fluid around your lymph nodes (lymphedema) as often as told. Do not smoke or use any products that contain nicotine or tobacco. If you need help quitting, ask your doctor. Keep all follow-up visits. Contact a doctor if: You notice any of these at or near the biopsy site: More redness, swelling, or pain. More fluid or blood. Warmth. Pus or a bad smell. The site breaking open after the stitches or skin tape strips have been removed. You have a rash or a fever. Get help right away if: You have trouble breathing. You have red streaks around the biopsy site. Summary After a breast biopsy, it is common to have bruising, numbness, tingling, or pain near your biopsy site. Ask your doctor if you should avoid driving or using machines while you are taking your medicine. If you had a cut made in your skin to do the biopsy, avoid activities that may pull the cut open. Return to your normal activities when your doctor says that it is safe. Wear a good support bra for as long as told by your doctor. This information is not intended to replace advice given to you by your health care provider. Make sure you discuss any questions you have with your health care provider. Document Revised: 01/31/2021 Document Reviewed: 01/31/2021 Elsevier Patient Education  2024 [employer] General Anesthesia, Adult, Care After The following information offers guidance on how to care for yourself after your procedure. Your health care provider may also give you more specific instructions. If you have problems or questions, contact your health care  provider. What can I expect after the procedure? After the procedure, it is common for people to: Have pain or discomfort at the IV site. Have nausea or vomiting. Have a sore throat or hoarseness. Have trouble concentrating. Feel cold or chills. Feel weak, sleepy, or tired (fatigue). Have soreness and body aches. These can affect parts of the body that were not involved in surgery. Follow these instructions at home: For the time period you were told by your health care provider:  Rest. Do not participate in activities where you could fall or become injured. Do not drive or use machinery. Do not drink alcohol. Do not take sleeping pills or medicines that cause drowsiness. Do  not make important decisions or sign legal documents. Do not take care of children on your own. General instructions Drink enough fluid to keep your urine pale yellow. If you have sleep apnea, surgery and certain medicines can increase your risk for breathing problems. Follow instructions from your health care provider about wearing your sleep device: Anytime you are sleeping, including during daytime naps. While taking prescription pain medicines, sleeping medicines, or medicines that make you drowsy. Return to your normal activities as told by your health care provider. Ask your health care provider what activities are safe for you. Take over-the-counter and prescription medicines only as told by your health care provider. Do not use any products that contain nicotine or tobacco. These products include cigarettes, chewing tobacco, and vaping devices, such as e-cigarettes. These can delay incision healing after surgery. If you need help quitting, ask your health care provider. Contact a health care provider if: You have nausea or vomiting that does not get better with medicine. You vomit every time you eat or drink. You have pain that does not get better with medicine. You cannot urinate or have bloody urine. You  develop a skin rash. You have a fever. Get help right away if: You have trouble breathing. You have chest pain. You vomit blood. These symptoms may be an emergency. Get help right away. Call 911. Do not wait to see if the symptoms will go away. Do not drive yourself to the hospital. Summary After the procedure, it is common to have a sore throat, hoarseness, nausea, vomiting, or to feel weak, sleepy, or fatigue. For the time period you were told by your health care provider, do not drive or use machinery. Get help right away if you have difficulty breathing, have chest pain, or vomit blood. These symptoms may be an emergency. This information is not intended to replace advice given to you by your health care provider. Make sure you discuss any questions you have with your health care provider. Document Revised: 07/06/2021 Document Reviewed: 07/06/2021 Elsevier Patient Education  2024 [employer] Inc. How to Use Chlorhexidine Before Surgery Chlorhexidine gluconate (CHG) is a germ-killing (antiseptic) solution that is used to clean the skin. It can get rid of the bacteria that normally live on the skin and can keep them away for about 24 hours. To clean your skin with CHG, you may be given: A CHG solution to use in the shower or as part of a sponge bath. A prepackaged cloth that contains CHG. Cleaning your skin with CHG may help lower the risk for infection: While you are staying in the intensive care unit of the hospital. If you have a vascular access, such as a central line, to provide short-term or long-term access to your veins. If you have a catheter to drain urine from your bladder. If you are on a ventilator. A ventilator is a machine that helps you breathe by moving air in and out of your lungs. After surgery. What are the risks? Risks of using CHG include: A skin reaction. Hearing loss, if CHG gets in your ears and you have a perforated eardrum. Eye injury, if CHG gets in your eyes  and is not rinsed out. The CHG product catching fire. Make sure that you avoid smoking and flames after applying CHG to your skin. Do not use CHG: If you have a chlorhexidine allergy or have previously reacted to chlorhexidine. On babies younger than 55 months of age. How to use CHG solution Use CHG only  as told by your health care provider, and follow the instructions on the label. Use the full amount of CHG as directed. Usually, this is one bottle. During a shower Follow these steps when using CHG solution during a shower (unless your health care provider gives you different instructions): Start the shower. Use your normal soap and shampoo to wash your face and hair. Turn off the shower or move out of the shower stream. Pour the CHG onto a clean washcloth. Do not use any type of brush or rough-edged sponge. Starting at your neck, lather your body down to your toes. Make sure you follow these instructions: If you will be having surgery, pay special attention to the part of your body where you will be having surgery. Scrub this area for at least 1 minute. Do not use CHG on your head or face. If the solution gets into your ears or eyes, rinse them well with water. Avoid your genital area. Avoid any areas of skin that have broken skin, cuts, or scrapes. Scrub your back and under your arms. Make sure to wash skin folds. Let the lather sit on your skin for 1-2 minutes or as long as told by your health care provider. Thoroughly rinse your entire body in the shower. Make sure that all body creases and crevices are rinsed well. Dry off with a clean towel. Do not put any substances on your body afterward--such as powder, lotion, or perfume--unless you are told to do so by your health care provider. Only use lotions that are recommended by the manufacturer. Put on clean clothes or pajamas. If it is the night before your surgery, sleep in clean sheets.  During a sponge bath Follow these steps when  using CHG solution during a sponge bath (unless your health care provider gives you different instructions): Use your normal soap and shampoo to wash your face and hair. Pour the CHG onto a clean washcloth. Starting at your neck, lather your body down to your toes. Make sure you follow these instructions: If you will be having surgery, pay special attention to the part of your body where you will be having surgery. Scrub this area for at least 1 minute. Do not use CHG on your head or face. If the solution gets into your ears or eyes, rinse them well with water. Avoid your genital area. Avoid any areas of skin that have broken skin, cuts, or scrapes. Scrub your back and under your arms. Make sure to wash skin folds. Let the lather sit on your skin for 1-2 minutes or as long as told by your health care provider. Using a different clean, wet washcloth, thoroughly rinse your entire body. Make sure that all body creases and crevices are rinsed well. Dry off with a clean towel. Do not put any substances on your body afterward--such as powder, lotion, or perfume--unless you are told to do so by your health care provider. Only use lotions that are recommended by the manufacturer. Put on clean clothes or pajamas. If it is the night before your surgery, sleep in clean sheets. How to use CHG prepackaged cloths Only use CHG cloths as told by your health care provider, and follow the instructions on the label. Use the CHG cloth on clean, dry skin. Do not use the CHG cloth on your head or face unless your health care provider tells you to. When washing with the CHG cloth: Avoid your genital area. Avoid any areas of skin that have broken skin,  cuts, or scrapes. Before surgery Follow these steps when using a CHG cloth to clean before surgery (unless your health care provider gives you different instructions): Using the CHG cloth, vigorously scrub the part of your body where you will be having surgery. Scrub  using a back-and-forth motion for 3 minutes. The area on your body should be completely wet with CHG when you are done scrubbing. Do not rinse. Discard the cloth and let the area air-dry. Do not put any substances on the area afterward, such as powder, lotion, or perfume. Put on clean clothes or pajamas. If it is the night before your surgery, sleep in clean sheets.  For general bathing Follow these steps when using CHG cloths for general bathing (unless your health care provider gives you different instructions). Use a separate CHG cloth for each area of your body. Make sure you wash between any folds of skin and between your fingers and toes. Wash your body in the following order, switching to a new cloth after each step: The front of your neck, shoulders, and chest. Both of your arms, under your arms, and your hands. Your stomach and groin area, avoiding the genitals. Your right leg and foot. Your left leg and foot. The back of your neck, your back, and your buttocks. Do not rinse. Discard the cloth and let the area air-dry. Do not put any substances on your body afterward--such as powder, lotion, or perfume--unless you are told to do so by your health care provider. Only use lotions that are recommended by the manufacturer. Put on clean clothes or pajamas. Contact a health care provider if: Your skin gets irritated after scrubbing. You have questions about using your solution or cloth. You swallow any chlorhexidine. Call your local poison control center [phone] in the [place] Get help right away if: Your eyes itch badly, or they become very red or swollen. Your skin itches badly and is red or swollen. Your hearing changes. You have trouble seeing. You have swelling or tingling in your mouth or throat. You have trouble breathing. These symptoms may represent a serious problem that is an emergency. Do not wait to see if the symptoms will go away. Get medical help right away. Call  your local emergency services (911 in the [place] Do not drive yourself to the hospital. Summary Chlorhexidine gluconate (CHG) is a germ-killing (antiseptic) solution that is used to clean the skin. Cleaning your skin with CHG may help to lower your risk for infection. You may be given CHG to use for bathing. It may be in a bottle or in a prepackaged cloth to use on your skin. Carefully follow your health care provider's instructions and the instructions on the product label. Do not use CHG if you have a chlorhexidine allergy. Contact your health care provider if your skin gets irritated after scrubbing. This information is not intended to replace advice given to you by your health care provider. Make sure you discuss any questions you have with your health care provider. Document Revised: 08/06/2021 Document Reviewed: 06/19/2020 Elsevier Patient Education  2023 [employer].

## 2022-09-26 ENCOUNTER — Encounter (HOSPITAL_COMMUNITY): Payer: Self-pay

## 2022-09-26 ENCOUNTER — Encounter (HOSPITAL_COMMUNITY)
Admission: RE | Admit: 2022-09-26 | Discharge: 2022-09-26 | Disposition: A | Discharge status: Home / Self Care | Payer: Medicaid Other | Source: Ambulatory Visit | Attending: General Surgery | Admitting: General Surgery

## 2022-09-26 VITALS — BP 127/85 | HR 64 | Temp 98.5°F | Resp 18 | Ht 71.0 in | Wt 275.0 lb

## 2022-09-26 DIAGNOSIS — Z01818 Encounter for other preprocedural examination: Secondary | ICD-10-CM | POA: Insufficient documentation

## 2022-09-26 DIAGNOSIS — R7303 Prediabetes: Secondary | ICD-10-CM | POA: Diagnosis not present

## 2022-09-26 HISTORY — DX: Unspecified asthma, uncomplicated: J45.909

## 2022-09-26 LAB — BASIC METABOLIC PANEL
Anion gap: 8 (ref 5–15)
BUN: 10 mg/dL (ref 6–20)
CO2: 21 mmol/L — ABNORMAL LOW (ref 22–32)
Calcium: 9.9 mg/dL (ref 8.9–10.3)
Chloride: 110 mmol/L (ref 98–111)
Creatinine, Ser: 0.81 mg/dL (ref 0.44–1.00)
GFR, Estimated: >60 mL/min (ref >60)
Glucose, Bld: 96 mg/dL (ref 70–99)
Potassium: 3.5 mmol/L (ref 3.5–5.1)
Sodium: 139 mmol/L (ref 135–145)

## 2022-09-26 LAB — PREGNANCY, URINE: Preg Test, Ur: NEGATIVE

## 2022-09-27 ENCOUNTER — Other Ambulatory Visit: Payer: Self-pay

## 2022-09-27 ENCOUNTER — Encounter (HOSPITAL_COMMUNITY)
Admission: RE | Disposition: A | Discharge status: Home / Self Care | Payer: Self-pay | Source: Home / Self Care | Attending: General Surgery

## 2022-09-27 ENCOUNTER — Ambulatory Visit (HOSPITAL_COMMUNITY)
Admission: RE | Admit: 2022-09-27 | Discharge: 2022-09-27 | Disposition: A | Discharge status: Home / Self Care | Payer: Medicaid Other | Attending: General Surgery | Admitting: General Surgery

## 2022-09-27 ENCOUNTER — Ambulatory Visit (HOSPITAL_BASED_OUTPATIENT_CLINIC_OR_DEPARTMENT_OTHER): Payer: Medicaid Other | Admitting: Certified Registered Nurse Anesthetist

## 2022-09-27 ENCOUNTER — Ambulatory Visit (HOSPITAL_COMMUNITY): Payer: Medicaid Other | Admitting: Certified Registered Nurse Anesthetist

## 2022-09-27 ENCOUNTER — Encounter (HOSPITAL_COMMUNITY): Payer: Self-pay | Admitting: General Surgery

## 2022-09-27 DIAGNOSIS — F17210 Nicotine dependence, cigarettes, uncomplicated: Secondary | ICD-10-CM | POA: Insufficient documentation

## 2022-09-27 DIAGNOSIS — J45909 Unspecified asthma, uncomplicated: Secondary | ICD-10-CM

## 2022-09-27 DIAGNOSIS — I10 Essential (primary) hypertension: Secondary | ICD-10-CM

## 2022-09-27 DIAGNOSIS — N611 Abscess of the breast and nipple: Secondary | ICD-10-CM | POA: Insufficient documentation

## 2022-09-27 DIAGNOSIS — Z8249 Family history of ischemic heart disease and other diseases of the circulatory system: Secondary | ICD-10-CM | POA: Diagnosis not present

## 2022-09-27 DIAGNOSIS — G709 Myoneural disorder, unspecified: Secondary | ICD-10-CM | POA: Insufficient documentation

## 2022-09-27 HISTORY — PX: EXCISION OF BREAST BIOPSY: SHX5822

## 2022-09-27 LAB — GLUCOSE, CAPILLARY
Glucose-Capillary: 102 mg/dL — ABNORMAL HIGH (ref 70–99)
Glucose-Capillary: 85 mg/dL (ref 70–99)

## 2022-09-27 SURGERY — EXCISION OF BREAST BIOPSY
Anesthesia: General | Site: Breast | Laterality: Right

## 2022-09-27 MED ORDER — LIDOCAINE HCL (CARDIAC) PF 100 MG/5ML IV SOSY
PREFILLED_SYRINGE | INTRAVENOUS | Status: DC | PRN
  Administered 2022-09-27: 100 mg via INTRAVENOUS

## 2022-09-27 MED ORDER — ONDANSETRON HCL 4 MG/2ML IJ SOLN
INTRAMUSCULAR | Status: DC | PRN
  Administered 2022-09-27: 4 mg via INTRAVENOUS

## 2022-09-27 MED ORDER — CEFAZOLIN SODIUM-DEXTROSE 2-4 GM/100ML-% IV SOLN
INTRAVENOUS | Status: AC
  Filled 2022-09-27: qty 100

## 2022-09-27 MED ORDER — BUPIVACAINE HCL (PF) 0.5 % IJ SOLN
INTRAMUSCULAR | Status: DC | PRN
  Administered 2022-09-27: 19 mL

## 2022-09-27 MED ORDER — ONDANSETRON HCL 4 MG PO TABS: 4.0000 mg | ORAL_TABLET | Freq: Three times a day (TID) | ORAL | 1 refills | Status: DC | PRN

## 2022-09-27 MED ORDER — CHLORHEXIDINE GLUCONATE 0.12 % MT SOLN: 15.0000 mL | Freq: Once | OROMUCOSAL | Status: AC

## 2022-09-27 MED ORDER — PROPOFOL 10 MG/ML IV BOLUS
INTRAVENOUS | Status: AC
  Filled 2022-09-27: qty 20

## 2022-09-27 MED ORDER — PROPOFOL 10 MG/ML IV BOLUS
INTRAVENOUS | Status: DC | PRN
  Administered 2022-09-27: 250 mg via INTRAVENOUS

## 2022-09-27 MED ORDER — BUPIVACAINE HCL (PF) 0.5 % IJ SOLN
INTRAMUSCULAR | Status: AC
  Filled 2022-09-27: qty 30

## 2022-09-27 MED ORDER — OXYCODONE HCL 5 MG PO TABS: 5.0000 mg | ORAL_TABLET | ORAL | 0 refills | Status: DC | PRN

## 2022-09-27 MED ORDER — DEXAMETHASONE SODIUM PHOSPHATE 10 MG/ML IJ SOLN
INTRAMUSCULAR | Status: AC
  Filled 2022-09-27: qty 1

## 2022-09-27 MED ORDER — FENTANYL CITRATE (PF) 250 MCG/5ML IJ SOLN
INTRAMUSCULAR | Status: DC | PRN
  Administered 2022-09-27 (×4): 25 ug via INTRAVENOUS

## 2022-09-27 MED ORDER — ONDANSETRON HCL 4 MG/2ML IJ SOLN: 4.0000 mg | Freq: Once | INTRAMUSCULAR | Status: DC | PRN

## 2022-09-27 MED ORDER — FENTANYL CITRATE (PF) 100 MCG/2ML IJ SOLN
INTRAMUSCULAR | Status: AC
  Filled 2022-09-27: qty 2

## 2022-09-27 MED ORDER — ORAL CARE MOUTH RINSE: 15.0000 mL | Freq: Once | OROMUCOSAL | Status: AC

## 2022-09-27 MED ORDER — FENTANYL CITRATE PF 50 MCG/ML IJ SOSY: 25.0000 ug | PREFILLED_SYRINGE | INTRAMUSCULAR | Status: DC | PRN

## 2022-09-27 MED ORDER — MIDAZOLAM HCL 2 MG/2ML IJ SOLN
INTRAMUSCULAR | Status: DC | PRN
  Administered 2022-09-27: 2 mg via INTRAVENOUS

## 2022-09-27 MED ORDER — LACTATED RINGERS IV SOLN: INTRAVENOUS | Status: DC

## 2022-09-27 MED ORDER — CHLORHEXIDINE GLUCONATE CLOTH 2 % EX PADS: 6.0000 | MEDICATED_PAD | Freq: Once | CUTANEOUS | Status: DC

## 2022-09-27 MED ORDER — OXYCODONE HCL 5 MG PO TABS: 5.0000 mg | ORAL_TABLET | Freq: Once | ORAL | Status: DC | PRN

## 2022-09-27 MED ORDER — 0.9 % SODIUM CHLORIDE (POUR BTL) OPTIME
TOPICAL | Status: DC | PRN
  Administered 2022-09-27: 1000 mL

## 2022-09-27 MED ORDER — MIDAZOLAM HCL 2 MG/2ML IJ SOLN
INTRAMUSCULAR | Status: AC
  Filled 2022-09-27: qty 2

## 2022-09-27 MED ORDER — CHLORHEXIDINE GLUCONATE 0.12 % MT SOLN
OROMUCOSAL | Status: AC
  Filled 2022-09-27: qty 15

## 2022-09-27 MED ORDER — DEXAMETHASONE SODIUM PHOSPHATE 10 MG/ML IJ SOLN
INTRAMUSCULAR | Status: DC | PRN
  Administered 2022-09-27: 10 mg via INTRAVENOUS

## 2022-09-27 MED ORDER — CEFAZOLIN IN SODIUM CHLORIDE 3-0.9 GM/100ML-% IV SOLN
3.0000 g | INTRAVENOUS | Status: AC
  Administered 2022-09-27: 3 g via INTRAVENOUS
  Filled 2022-09-27: qty 100

## 2022-09-27 MED ORDER — ONDANSETRON HCL 4 MG/2ML IJ SOLN
INTRAMUSCULAR | Status: AC
  Filled 2022-09-27: qty 2

## 2022-09-27 MED ORDER — OXYCODONE HCL 5 MG/5ML PO SOLN: 5.0000 mg | Freq: Once | ORAL | Status: DC | PRN

## 2022-09-27 MED ORDER — LIDOCAINE HCL (PF) 2 % IJ SOLN
INTRAMUSCULAR | Status: AC
  Filled 2022-09-27: qty 5

## 2022-09-27 SURGICAL SUPPLY — 26 items
ADH SKN CLS APL DERMABOND .7 (GAUZE/BANDAGES/DRESSINGS) ×1
APL PRP STRL LF DISP 70% ISPRP (MISCELLANEOUS) ×1
CHLORAPREP W/TINT 26 (MISCELLANEOUS) ×1 IMPLANT
CLOTH BEACON ORANGE TIMEOUT ST (SAFETY) ×1 IMPLANT
COVER LIGHT HANDLE STERIS (MISCELLANEOUS) ×2 IMPLANT
DERMABOND ADVANCED .7 DNX12 (GAUZE/BANDAGES/DRESSINGS) ×1 IMPLANT
ELECT REM PT RETURN 9FT ADLT (ELECTROSURGICAL) ×1
ELECTRODE REM PT RTRN 9FT ADLT (ELECTROSURGICAL) ×1 IMPLANT
GLOVE BIO SURGEON STRL SZ 6.5 (GLOVE) ×1 IMPLANT
GLOVE BIOGEL PI IND STRL 6.5 (GLOVE) ×1 IMPLANT
GLOVE BIOGEL PI IND STRL 7.0 (GLOVE) ×2 IMPLANT
GLOVE ECLIPSE 6.5 STRL STRAW (GLOVE) IMPLANT
GOWN STRL REUS W/TWL LRG LVL3 (GOWN DISPOSABLE) ×2 IMPLANT
KIT TURNOVER KIT A (KITS) ×1 IMPLANT
MANIFOLD NEPTUNE II (INSTRUMENTS) ×1 IMPLANT
NDL HYPO 25X1 1.5 SAFETY (NEEDLE) ×1 IMPLANT
NEEDLE HYPO 25X1 1.5 SAFETY (NEEDLE) ×1 IMPLANT
NS IRRIG 1000ML POUR BTL (IV SOLUTION) ×1 IMPLANT
PACK MINOR (CUSTOM PROCEDURE TRAY) ×1 IMPLANT
PAD ARMBOARD 7.5X6 YLW CONV (MISCELLANEOUS) ×1 IMPLANT
SET BASIN LINEN APH (SET/KITS/TRAYS/PACK) ×1 IMPLANT
SUT MNCRL AB 4-0 PS2 18 (SUTURE) ×1 IMPLANT
SUT SILK 2 0 SH (SUTURE) IMPLANT
SUT VIC AB 3-0 SH 27 (SUTURE) ×1
SUT VIC AB 3-0 SH 27X BRD (SUTURE) ×1 IMPLANT
SYR CONTROL 10ML LL (SYRINGE) ×1 IMPLANT

## 2022-09-27 NOTE — Transfer of Care (Signed)
Immediate Anesthesia Transfer of Care Note  Patient: Moxie D Bacha  Procedure(s) Performed: EXCISION OF BREAST BIOPSY (Right: Breast)  Patient Location: PACU  Anesthesia Type:General  Level of Consciousness: awake, alert , and oriented  Airway & Oxygen Therapy: Patient Spontanous Breathing  Post-op Assessment: Report given to RN and Post -op Vital signs reviewed and stable  Post vital signs: Reviewed and stable  Last Vitals:  Vitals Value Taken Time  BP 145/92   Temp 97.8   Pulse 83 09/27/22 0811  Resp 14   SpO2 93 % 09/27/22 0811  Vitals shown include unvalidated device data.  Last Pain:  Vitals:   09/27/22 0640  TempSrc: Oral  PainSc:          Complications: No notable events documented.

## 2022-09-27 NOTE — Op Note (Signed)
[facility] Operative Note  09/27/22  Preoperative Diagnosis: Right breast recurrent abscess    Postoperative Diagnosis: Same   Procedure(s) Performed: Excisional breast biopsy    Surgeon: Lillia Abed C. Henreitta Leber, MD   Assistants: No qualified resident was available    Anesthesia: General endotracheal   Anesthesiologist: Windell Norfolk, MD    Specimens: Right breast recurrent abscess (medial areola region, suture marked short superior, long lateral)   Estimated Blood Loss: Minimal   Blood Replacement: None    Complications: None   Wound Class: Clean contaminated (keratin like material from the nipple)   Operative Indications: Ms. Zebedee Iba has had recurrent right breast abscess for years and reports this area getting swollen and drained. She had an ultrasound that showed a resolving fluid pocket in the 3 o'clock retro areolar region where she reported having the drainage. We discussed excision to get this tissue out and risk of bleeding, infection, cosmetic changes to the areola, recurrence especially with smoking  Findings: Ulcerated skin at 3 o'clock on the areola of the right breast, ellipsed out skin, hardened fibrotic tissue excised underneath and extending medial toward the nipple, white keratin like material expressed from nipple   Procedure: The patient was taken to the operating room and placed supine. General endotracheal anesthesia was induced. Intravenous antibiotics were administered per protocol.  The right breast was prepped and draped in the usual sterile fashion.   An elliptical incision was made around the ulcerated skin at 3 o'clock on the areola of the right breast where the area drains periodically. This was  taken down to the breast tissue and retro-areolar tissue with cautery. The hardened fibrotic tissue inferior to this ellipse and extending laterally under the nipple was excised. The inflamed tissue extended to the medial aspect of the nipple  and while manipulating this tissue white keratin like material was expressed. She could have had a cyst in this area causing these recurrent abscesses. The specimen was marked short superior and long lateral. The tissue was taken down to healthy breast tissue. The wound was made hemostatic and irrigated. Local was injected. The deep space was closed with 3-0 Vicryl interrupted and the skin was closed with 4-0 Monocryl subcuticular and dermabond.   Final inspection revealed acceptable hemostasis. All counts were correct at the end of the case. The patient was awakened from anesthesia and extubated without complication.  The patient went to the PACU in stable condition.   Algis Greenhouse, MD [facility] [address] [place] [place], [place] [address] [phone] (office)

## 2022-09-27 NOTE — Anesthesia Preprocedure Evaluation (Signed)
Anesthesia Evaluation  Patient identified by MRN, date of birth, ID band Patient awake    Reviewed: Allergy & Precautions, H&P , NPO status , Patient's Chart, lab work & pertinent test results, reviewed documented beta blocker date and time   Airway Mallampati: II  TM Distance: >3 FB Neck ROM: full    Dental no notable dental hx.    Pulmonary neg pulmonary ROS, asthma , Current Smoker and Patient abstained from smoking.   Pulmonary exam normal breath sounds clear to auscultation       Cardiovascular Exercise Tolerance: Good hypertension, negative cardio ROS  Rhythm:regular Rate:Normal     Neuro/Psych  PSYCHIATRIC DISORDERS       Neuromuscular disease negative neurological ROS  negative psych ROS   GI/Hepatic negative GI ROS, Neg liver ROS,,,  Endo/Other  negative endocrine ROS    Renal/GU Renal diseasenegative Renal ROS  negative genitourinary   Musculoskeletal   Abdominal   Peds  Hematology negative hematology ROS (+)   Anesthesia Other Findings   Reproductive/Obstetrics negative OB ROS                             Anesthesia Physical Anesthesia Plan  ASA: 3  Anesthesia Plan: General   Post-op Pain Management:    Induction:   PONV Risk Score and Plan: Ondansetron  Airway Management Planned:   Additional Equipment:   Intra-op Plan:   Post-operative Plan:   Informed Consent: I have reviewed the patients History and Physical, chart, labs and discussed the procedure including the risks, benefits and alternatives for the proposed anesthesia with the patient or authorized representative who has indicated his/her understanding and acceptance.     Dental Advisory Given  Plan Discussed with: CRNA  Anesthesia Plan Comments:        Anesthesia Quick Evaluation

## 2022-09-27 NOTE — Progress Notes (Signed)
[facility]  Wife updated. Will send in Rx to [facility]. Will see in the clinic 6/17 to check on the wound.   Algis Greenhouse, MD [facility] [address] [place], [place] [address] [phone] (office)

## 2022-09-27 NOTE — Anesthesia Postprocedure Evaluation (Signed)
Anesthesia Post Note  Patient: Norma Nelson  Procedure(s) Performed: EXCISION OF BREAST BIOPSY (Right: Breast)  Patient location during evaluation: Phase II Anesthesia Type: General Level of consciousness: awake Pain management: pain level controlled Vital Signs Assessment: post-procedure vital signs reviewed and stable Respiratory status: spontaneous breathing and respiratory function stable Cardiovascular status: blood pressure returned to baseline and stable Postop Assessment: no headache and no apparent nausea or vomiting Anesthetic complications: no Comments: Late entry   No notable events documented.   Last Vitals:  Vitals:   09/27/22 0830 09/27/22 0831  BP:  (!) 149/86  Pulse: 69 64  Resp: 20   Temp:  (!) 36.4 C  SpO2: 96% 95%    Last Pain:  Vitals:   09/27/22 0831  TempSrc: Oral  PainSc: 0-No pain                 Windell Norfolk

## 2022-09-27 NOTE — Anesthesia Procedure Notes (Signed)
Procedure Name: LMA Insertion Date/Time: 09/27/2022 7:31 AM  Performed by: Lorin Glass, CRNAPre-anesthesia Checklist: Emergency Drugs available, Patient identified, Suction available and Patient being monitored Patient Re-evaluated:Patient Re-evaluated prior to induction Oxygen Delivery Method: Circle system utilized Preoxygenation: Pre-oxygenation with 100% oxygen Induction Type: IV induction LMA: LMA inserted LMA Size: 4.0 Number of attempts: 1 Placement Confirmation: positive ETCO2 and breath sounds checked- equal and bilateral Tube secured with: Tape Dental Injury: Teeth and Oropharynx as per pre-operative assessment

## 2022-09-27 NOTE — Interval H&P Note (Signed)
History and Physical Interval Note:  09/27/2022 7:19 AM  Norma Nelson  has presented today for surgery, with the diagnosis of RECURRENT BREAST ABSCESS, RIGHT.  The various methods of treatment have been discussed with the patient and family. After consideration of risks, benefits and other options for treatment, the patient has consented to  Procedure(s): EXCISION OF BREAST BIOPSY (Right) as a surgical intervention.  The patient's history has been reviewed, patient examined, no change in status, stable for surgery.  I have reviewed the patient's chart and labs.  Questions were answered to the patient's satisfaction.    Marked. Discusses cosmetic changes of areola given need to remove some skin.  [name]

## 2022-09-27 NOTE — Discharge Instructions (Signed)
Discharge instructions after breast surgery:   Common Complaints: Pain and bruising at the incision sites.  Swelling at the incision sites.  Diet/ Activity: Diet as tolerated.  You may shower but do not take hot showers as this can disrupt the glue. Rest and listen to your body, but do not remain in bed all day.  Walk everyday for at least 15-20 minutes. Deep cough and move around every 1-2 hours in the first few days after surgery.  Do not do anything that makes you feel like you are putting unnecessary pull or stretch on the incision sites.  Do move your arm and shoulder. If you do not move then you can get stiff and hurt more.  Do not pick at the dermabond glue on your incision sites.  This glue film will remain in place for 1-2 weeks and will start to peel off.  Do not place lotions or balms on your incision unless instructed to specifically by Dr. Henreitta Leber.   Pain Expectations and Narcotics: -After surgery you will have pain associated with your incisions and this is normal. The pain is muscular and nerve pain, and will get better with time. -You are encouraged and expected to take non narcotic medications like tylenol and ibuprofen (when able) to treat pain as multiple modalities can aid with pain treatment. -Narcotics are only used when pain is severe or there is breakthrough pain. -You are not expected to have a pain score of 0 after surgery, as we cannot prevent pain. A pain score of 3-4 that allows you to be functional, move, walk, and tolerate some activity is the goal. The pain will continue to improve over the days after surgery and is dependent on your surgery. -Due to [place] law, we are only able to give a certain amount of pain medication to treat post operative pain, and we only give additional narcotics on a patient by patient basis.  -Having appropriate expectations of pain and knowledge of pain management with non narcotics is important as we do not want anyone to become addicted  to narcotic pain medication.  -Using ice packs in the first 48 hours and heating pads after 48 hours, wearing an abdominal binder (when recommended), and using over the counter medications are all ways to help with pain management.   -Simple acts like meditation and mindfulness practices after surgery can also help with pain control and research has proven the benefit of these practices.  Medication: Take tylenol and ibuprofen as needed for pain control, alternating every 4-6 hours.  Example:  Tylenol 1000mg  @ 6am, 12noon, 6pm, (Do not exceed 4000mg  of tylenol a day). Ibuprofen 800mg  @ 9am, 3pm, 9pm, 3am (Do not exceed 3600mg  of ibuprofen a day).  Take Roxicodone for breakthrough pain every 4 hours.  Take Colace for constipation related to narcotic pain medication. If you do not have a bowel movement in 2 days, take Miralax over the counter.  Drink plenty of water to also prevent constipation.   Contact Information: If you have questions or concerns, please call our office, [phone], Monday- Thursday 8AM-5PM and Friday 8AM-12Noon.  If it is after hours or on the weekend, please call [facility], [phone], [phone], and ask to speak to the surgeon on call for Dr. Henreitta Leber at [facility].

## 2022-09-30 ENCOUNTER — Telehealth: Payer: Self-pay | Admitting: *Deleted

## 2022-09-30 LAB — SURGICAL PATHOLOGY

## 2022-09-30 NOTE — Telephone Encounter (Signed)
Surgical Date: 09/27/2022 Procedure: Excision Breast, Right  Received call from patient [phone]~ telephone.   Patient reports drainage from under Dermabond on breast incision. Reports that drainage is serosanguinous. Denies purulent drainage, fever, Sx of infection.   Advised to monitor for infection. Advised that if severe pain noted, seroma/ hematoma can be drained in office.   Verbalized understanding.

## 2022-10-01 ENCOUNTER — Encounter (HOSPITAL_COMMUNITY): Payer: Self-pay | Admitting: General Surgery

## 2022-10-07 ENCOUNTER — Ambulatory Visit (INDEPENDENT_AMBULATORY_CARE_PROVIDER_SITE_OTHER): Payer: Medicaid Other | Admitting: General Surgery

## 2022-10-07 ENCOUNTER — Encounter: Payer: Self-pay | Admitting: General Surgery

## 2022-10-07 VITALS — BP 145/88 | HR 74 | Temp 98.3°F | Resp 16 | Ht 71.0 in | Wt 275.0 lb

## 2022-10-07 DIAGNOSIS — F17210 Nicotine dependence, cigarettes, uncomplicated: Secondary | ICD-10-CM

## 2022-10-07 MED ORDER — NICOTINE 7 MG/24HR TD PT24
7.0000 mg | MEDICATED_PATCH | Freq: Every day | TRANSDERMAL | 0 refills | Status: DC
Start: 2022-10-07 — End: 2023-02-23

## 2022-10-07 NOTE — Patient Instructions (Addendum)
A. BREAST, RIGHT, RECURRING ABSCESS, EXCISION:  - Skin and underlying breast parenchyma with abscess  - No evidence of malignancy   Keep area clean and can put neosporin on the area. Drainage currently is not infected. Monitor for any skin redness or drainage changing to white/ purulent/ pus like drainage.  You can use feminine pads to collect the drainage. This should slow down/ less with time.

## 2022-10-07 NOTE — Progress Notes (Signed)
[facility]  A. BREAST, RIGHT, RECURRING ABSCESS, EXCISION:  - Skin and underlying breast parenchyma with abscess  - No evidence of malignancy   Doing well but having some serous drainage at night on gauze. No redness.   BP (!) 145/88   Pulse 74   Temp 98.3 F (36.8 C) (Oral)   Resp 16   Ht 5\' 11"  (1.803 m)   Wt 275 lb (124.7 kg)   SpO2 96%   BMI 38.35 kg/m  Seroma indurated area about size of ping pong ball, no erythema, minor skin dehiscence superficial < 0.5cm in size, no active drainage, no erythema   Patient s/p excision of breast abscess that was recurrent. Doing well but has some seroma that is draining. No signs of infection. Also requesting nicotine patches. Uses black and milds and smokes about 3-4 a day. Down to 1 a day now.   Keep area clean and can put neosporin on the area. Drainage currently is not infected. Monitor for any skin redness or drainage changing to white/ purulent/ pus like drainage.  You can use feminine pads to collect the drainage. This should slow down/ less with time.  Nicotine patch sent in  Can get PCP to refill   Future Appointments  Date Time Provider Department Center  10/30/2022  2:30 PM [name], MD [facility] None      Algis Greenhouse, MD [facility] [address] [place], [place] [address] [phone] (office)

## 2022-10-30 ENCOUNTER — Encounter: Payer: MEDICAID | Admitting: General Surgery

## 2022-11-01 ENCOUNTER — Ambulatory Visit: Payer: Self-pay

## 2022-11-01 ENCOUNTER — Encounter: Payer: Self-pay | Admitting: Family Medicine

## 2022-11-07 ENCOUNTER — Ambulatory Visit: Payer: Medicaid Other | Admitting: Family Medicine

## 2022-11-12 ENCOUNTER — Encounter: Payer: MEDICAID | Admitting: General Surgery

## 2022-11-13 ENCOUNTER — Ambulatory Visit (INDEPENDENT_AMBULATORY_CARE_PROVIDER_SITE_OTHER): Payer: MEDICAID | Admitting: General Surgery

## 2022-11-13 DIAGNOSIS — N611 Abscess of the breast and nipple: Secondary | ICD-10-CM

## 2022-11-13 NOTE — Progress Notes (Unsigned)
[facility]  I am calling the patient for post operative evaluation. This is not a billable encounter as it is under the global charges for the surgery.  The patient had an excisional breast biopsy for recurrent abscess on 6/7 and followed up in the clinic on 6/17.  She had rescheduled a follow up from 7/10 to a phone call today. We had planned for that due to some drainage from her incision. She did not answer the phone and I left a Vm for her to call the office.   Will see the patient PRN.   Algis Greenhouse, MD [facility] [address] [place], [place] [address] [phone] (office)

## 2023-02-22 ENCOUNTER — Emergency Department (HOSPITAL_COMMUNITY)
Admission: EM | Admit: 2023-02-22 | Discharge: 2023-02-22 | Disposition: A | Discharge status: Home / Self Care | Payer: MEDICAID | Attending: Emergency Medicine | Admitting: Emergency Medicine

## 2023-02-22 ENCOUNTER — Ambulatory Visit (INDEPENDENT_AMBULATORY_CARE_PROVIDER_SITE_OTHER)
Admission: EM | Admit: 2023-02-22 | Discharge: 2023-02-22 | Disposition: A | Discharge status: Home / Self Care | Payer: MEDICAID | Attending: Behavioral Health | Admitting: Behavioral Health

## 2023-02-22 ENCOUNTER — Encounter (HOSPITAL_COMMUNITY): Payer: Self-pay | Admitting: Emergency Medicine

## 2023-02-22 DIAGNOSIS — F419 Anxiety disorder, unspecified: Secondary | ICD-10-CM | POA: Insufficient documentation

## 2023-02-22 DIAGNOSIS — F1414 Cocaine abuse with cocaine-induced mood disorder: Secondary | ICD-10-CM | POA: Insufficient documentation

## 2023-02-22 DIAGNOSIS — F32A Depression, unspecified: Secondary | ICD-10-CM | POA: Insufficient documentation

## 2023-02-22 DIAGNOSIS — G4700 Insomnia, unspecified: Secondary | ICD-10-CM | POA: Insufficient documentation

## 2023-02-22 DIAGNOSIS — F1994 Other psychoactive substance use, unspecified with psychoactive substance-induced mood disorder: Secondary | ICD-10-CM | POA: Diagnosis not present

## 2023-02-22 DIAGNOSIS — F1410 Cocaine abuse, uncomplicated: Secondary | ICD-10-CM

## 2023-02-22 DIAGNOSIS — R630 Anorexia: Secondary | ICD-10-CM | POA: Insufficient documentation

## 2023-02-22 DIAGNOSIS — Z3202 Encounter for pregnancy test, result negative: Secondary | ICD-10-CM | POA: Diagnosis not present

## 2023-02-22 LAB — COMPREHENSIVE METABOLIC PANEL
ALT: 17 U/L (ref 0–44)
AST: 17 U/L (ref 15–41)
Albumin: 3.5 g/dL (ref 3.5–5.0)
Alkaline Phosphatase: 100 U/L (ref 38–126)
Anion gap: 11 (ref 5–15)
BUN: 6 mg/dL (ref 6–20)
CO2: 20 mmol/L — ABNORMAL LOW (ref 22–32)
Calcium: 10.1 mg/dL (ref 8.9–10.3)
Chloride: 107 mmol/L (ref 98–111)
Creatinine, Ser: 0.81 mg/dL (ref 0.44–1.00)
GFR, Estimated: >60 mL/min (ref >60)
Glucose, Bld: 100 mg/dL — ABNORMAL HIGH (ref 70–99)
Potassium: 3.1 mmol/L — ABNORMAL LOW (ref 3.5–5.1)
Sodium: 138 mmol/L (ref 135–145)
Total Bilirubin: 0.5 mg/dL (ref 0.3–1.2)
Total Protein: 7 g/dL (ref 6.5–8.1)

## 2023-02-22 LAB — LIPID PANEL
Cholesterol: 126 mg/dL (ref 0–200)
HDL: 32 mg/dL — ABNORMAL LOW (ref >40)
LDL Cholesterol: 83 mg/dL (ref 0–99)
Total CHOL/HDL Ratio: 3.9 {ratio}
Triglycerides: 55 mg/dL (ref <150)
VLDL: 11 mg/dL (ref 0–40)

## 2023-02-22 LAB — HEMOGLOBIN A1C
Hgb A1c MFr Bld: 5.6 % (ref 4.8–5.6)
Mean Plasma Glucose: 114.02 mg/dL

## 2023-02-22 LAB — POCT URINE DRUG SCREEN - MANUAL ENTRY (I-SCREEN)
POC Amphetamine UR: NOT DETECTED
POC Buprenorphine (BUP): NOT DETECTED
POC Cocaine UR: POSITIVE — AB
POC Marijuana UR: POSITIVE — AB
POC Methadone UR: NOT DETECTED
POC Methamphetamine UR: NOT DETECTED
POC Morphine: NOT DETECTED
POC Oxazepam (BZO): NOT DETECTED
POC Oxycodone UR: NOT DETECTED
POC Secobarbital (BAR): NOT DETECTED

## 2023-02-22 LAB — CBC WITH DIFFERENTIAL/PLATELET
Abs Immature Granulocytes: 0.09 10*3/uL — ABNORMAL HIGH (ref 0.00–0.07)
Basophils Absolute: 0.1 10*3/uL (ref 0.0–0.1)
Basophils Relative: 1 %
Eosinophils Absolute: 0.6 10*3/uL — ABNORMAL HIGH (ref 0.0–0.5)
Eosinophils Relative: 3 %
HCT: 35.2 % — ABNORMAL LOW (ref 36.0–46.0)
Hemoglobin: 11.7 g/dL — ABNORMAL LOW (ref 12.0–15.0)
Immature Granulocytes: 1 %
Lymphocytes Relative: 19 %
Lymphs Abs: 3.8 10*3/uL (ref 0.7–4.0)
MCH: 26.9 pg (ref 26.0–34.0)
MCHC: 33.2 g/dL (ref 30.0–36.0)
MCV: 80.9 fL (ref 80.0–100.0)
Monocytes Absolute: 2.2 10*3/uL — ABNORMAL HIGH (ref 0.1–1.0)
Monocytes Relative: 11 %
Neutro Abs: 13.1 10*3/uL — ABNORMAL HIGH (ref 1.7–7.7)
Neutrophils Relative %: 65 %
Platelets: 414 10*3/uL — ABNORMAL HIGH (ref 150–400)
RBC: 4.35 MIL/uL (ref 3.87–5.11)
RDW: 13.7 % (ref 11.5–15.5)
WBC: 20 10*3/uL — ABNORMAL HIGH (ref 4.0–10.5)
nRBC: 0 % (ref 0.0–0.2)

## 2023-02-22 LAB — POC URINE PREG, ED: Preg Test, Ur: NEGATIVE

## 2023-02-22 LAB — ETHANOL: Alcohol, Ethyl (B): <10 mg/dL (ref <10)

## 2023-02-22 LAB — POCT PREGNANCY, URINE: Preg Test, Ur: NEGATIVE

## 2023-02-22 LAB — TSH: TSH: 1.083 u[IU]/mL (ref 0.350–4.500)

## 2023-02-22 MED ORDER — ACETAMINOPHEN 325 MG PO TABS: 650.0000 mg | ORAL_TABLET | Freq: Four times a day (QID) | ORAL | Status: DC | PRN

## 2023-02-22 MED ORDER — MIRTAZAPINE 15 MG PO TABS: 15.0000 mg | ORAL_TABLET | Freq: Every day | ORAL | Status: DC

## 2023-02-22 MED ORDER — METOPROLOL TARTRATE 25 MG PO TABS: 25.0000 mg | ORAL_TABLET | Freq: Two times a day (BID) | ORAL | Status: DC

## 2023-02-22 MED ORDER — ALBUTEROL SULFATE HFA 108 (90 BASE) MCG/ACT IN AERS: 2.0000 | INHALATION_SPRAY | Freq: Four times a day (QID) | RESPIRATORY_TRACT | Status: DC | PRN

## 2023-02-22 MED ORDER — HYDROXYZINE HCL 25 MG PO TABS
25.0000 mg | ORAL_TABLET | Freq: Three times a day (TID) | ORAL | Status: DC | PRN
  Administered 2023-02-22: 25 mg via ORAL
  Filled 2023-02-22: qty 1

## 2023-02-22 MED ORDER — ALUM & MAG HYDROXIDE-SIMETH 200-200-20 MG/5ML PO SUSP: 30.0000 mL | ORAL | Status: DC | PRN

## 2023-02-22 MED ORDER — MIRTAZAPINE 15 MG PO TABS
15.0000 mg | ORAL_TABLET | Freq: Every day | ORAL | Status: DC
  Administered 2023-02-22: 15 mg via ORAL
  Filled 2023-02-22: qty 1

## 2023-02-22 MED ORDER — AMLODIPINE BESYLATE 10 MG PO TABS
10.0000 mg | ORAL_TABLET | Freq: Every day | ORAL | Status: DC
  Administered 2023-02-22: 10 mg via ORAL
  Filled 2023-02-22: qty 1

## 2023-02-22 MED ORDER — MONTELUKAST SODIUM 10 MG PO TABS
10.0000 mg | ORAL_TABLET | Freq: Every day | ORAL | Status: DC
  Administered 2023-02-22: 10 mg via ORAL
  Filled 2023-02-22: qty 1

## 2023-02-22 MED ORDER — MAGNESIUM HYDROXIDE 400 MG/5ML PO SUSP: 30.0000 mL | Freq: Every day | ORAL | Status: DC | PRN

## 2023-02-22 NOTE — ED Provider Notes (Cosign Needed Addendum)
Behavioral Health Urgent Care Medical Screening Exam  Patient Name: Norma Nelson MRN: [id number] Date of Evaluation: 02/22/23 Chief Complaint:   Diagnosis:  Final diagnoses:  Substance induced mood disorder (HCC)  Cocaine abuse (HCC)    History of Present illness: Norma Nelson is a 37 y.o. female patient with a past psychiatric history reported as insomnia and anxiety and a documented history of depression and SI who presents to the [facility] behavioral health urgent care voluntary accompanied by her brother seeking substance abuse treatment for cocaine abuse and depressive symptoms.  Patient states that she has been using cocaine for the past 3 years, everyday if she can get it by snorting. She states that on average she spends $200 per day on cocaine and last used cocaine this morning. She reports substance abuse treatment 2 years ago at [facility]. She denies using other illicit drugs or alcohol. She is currently interested in residential treatment as she has tried outpatient substance abuse treatment at [facility] in the past and was unsuccessful. She states that her cocaine use is affecting her relationships with others. She reports a psychiatric history of insomnia and anxiety and states that she was prescribed trazodone at [facility] a year ago. She states that she stopped taking the trazodone because it would make her feel groggy. She reports feeling depressed off and on since her mother passed in 2019. She describes her depressive symptoms as isolating, not wanting to talk to people, sadness, hopelessness, guilt, crying spells, feeling worthless and like a failure. She reports worsening anxiety and states that she is always worrying about everything and feeling like something bad is going to happen. She reports poor sleep, on average she sleeps about 5 hours. She reports a poor appetite due to using cocaine and states that she eats 1 meal per day. She states that she has loss 40 pounds over  the course of 5 months due to using cocaine. She denies SI/HI/AVH. There is no objective evidence that the patient is currently responding to internal or external stimuli on exam. 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 She resides with her wife. She does not have children. She denies legal issues. She does not have outpatient psychiatry or therapy at this time.  She denies past inpatient psychiatric hospitalizations. She reports a family psychiatric history of mother was addicted to cocaine and had PTSD and depression. She reports a history of hypertension and states that she takes metoprolol 50 mg twice daily and amlodipine and history of asthma and is prescribed Singulair.   Patient's WBC elevated 20.0 K/ul on arrival. Patient's states that her WBC is always elevated because she has a would on her right breast from smoking cigarettes. Patient denies pain, swelling or redness to the site. Patient is afebrile. Patient's breast examined by this provider along with Cala Bradford, RN., present. Patient is noted to have a small wound to her right medial breast that appears to be healing fairly well without any noticeable drainage, erythema, odor, edema or discoloration.  Patient denies urinary complications. No further evaluation required at this time. Will continue to monitor.   Flowsheet Row ED from 02/22/2023 in [facility] Most recent reading at 02/22/2023  4:30 PM ED from 02/22/2023 in [facility] at [facility] Most recent reading at 02/22/2023  2:17 PM Admission (Discharged) from 09/27/2022 in [facility] Most recent reading at 09/27/2022  6:18 AM  C-SSRS RISK CATEGORY No Risk  No Risk No Risk       Psychiatric Specialty Exam  Presentation  General Appearance:Appropriate for Environment  Eye Contact:Fair  Speech:Clear and Coherent  Speech Volume:Normal  Handedness:Right   Mood and Affect   Mood:Depressed  Affect:Congruent   Thought Process  Thought Processes:Coherent  Descriptions of Associations:Intact  Orientation:Full (Time, Place and Person)  Thought Content:Logical  Diagnosis of Schizophrenia or Schizoaffective disorder in past: No   Hallucinations:None  Ideas of Reference:None  Suicidal Thoughts:No  Homicidal Thoughts:No   Sensorium  Memory:Immediate Fair; Recent Fair; Remote Fair  Judgment:Intact  Insight:Present   Executive Functions  Concentration:Fair  Attention Span:Fair  Recall:Fair  Fund of Knowledge:Fair  Language:Fair   Psychomotor Activity  Psychomotor Activity:Normal   Assets  Assets:Communication Skills; Desire for Improvement; Financial Resources/Insurance; Housing; Intimacy; Leisure Time; Physical Health   Sleep  Sleep:Poor  Number of hours: 5   Physical Exam: Physical Exam HENT:     Head: Normocephalic.     Nose: Nose normal.  Eyes:     Conjunctiva/sclera: Conjunctivae normal.  Cardiovascular:     Rate and Rhythm: Normal rate.  Pulmonary:     Effort: Pulmonary effort is normal.  Musculoskeletal:        General: Normal range of motion.     Cervical back: Normal range of motion.  Neurological:     Mental Status: She is alert and oriented to person, place, and time.    Review of Systems  Constitutional: Negative.   HENT: Negative.    Eyes: Negative.   Respiratory: Negative.    Cardiovascular: Negative.   Gastrointestinal: Negative.   Genitourinary: Negative.   Musculoskeletal: Negative.   Neurological: Negative.   Endo/Heme/Allergies: Negative.   Psychiatric/Behavioral:  Positive for depression and substance abuse. The patient is nervous/anxious.    Blood pressure (!) 153/87, pulse 81, temperature 98.7 F (37.1 C), temperature source Oral, resp. rate 18, height 5\' 11"  (1.803 m), weight 187 lb (84.8 kg), SpO2 100%. Body mass index is 26.08 kg/m.  Musculoskeletal: Strength & Muscle Tone:  within normal limits Gait & Station: normal Patient leans: N/A   [facility] MSE Discharge Disposition for Follow up and Recommendations: Based on my evaluation I certify that psychiatric inpatient services furnished can reasonably be expected to improve the patient's condition which I recommend transfer to an appropriate accepting facility.   Patient accepted to the Facility Based Crisis unit for substance abuse treatment and mood stabilizations. I discussed the risks and benefits of initiating Remeron to treat depression, stimulate appetite and promote sleep. Will restart home medications. Patient agreeable to stated plan of care. Patient is also interested in transitioning to residential treatment.   Lab Orders         CBC with Differential/Platelet         Comprehensive metabolic panel         Hemoglobin A1c         Ethanol         Lipid panel         TSH         POC urine preg, ED         POCT Urine Drug Screen - (I-Screen)    EKG   Gevork Ayyad L, NP 02/22/2023, 4:30 PM

## 2023-02-22 NOTE — ED Notes (Signed)
Urine sample with patient label at bedside

## 2023-02-22 NOTE — ED Notes (Signed)
Patient is calm and cooperative. She denies SI/HI/AVH. Patient states she is here to detox from cocaine. She denies being in pain. Patient oriented to the unit and informed about the process. Patient was provided dinner and juice. No additional needs noted at this time. Will continue to monitor for safety.

## 2023-02-22 NOTE — Discharge Instructions (Signed)
Transfer to [facility]

## 2023-02-22 NOTE — Progress Notes (Signed)
   02/22/23 1622  [facility] Triage Screening (Walk-ins at [facility] only)  How Did You Hear About [place]? Self  What Is the Reason for Your Visit/Call Today? 37 year old female comes in with chief complaint of cocaine use disorder and wanting help with that.  Patient states that she has some anxiety, sleep issues and is on trazodone.  She has been using cocaine for several years.  Last time she tried to quit was about a year or 2 ago.  She was admitted to some facility, but has relapsed.  Her brother is at the bedside.  He states that he has intervened to help her as he can tell that she needs further assistance.  He was unaware of the severity of her addiction until now. Patient denies any S/I, H/I or AVH.  How Long Has This Been Causing You Problems? > than 6 months  Have You Recently Had Any Thoughts About Hurting Yourself? No  Are You Planning to Commit Suicide/Harm Yourself At This time? No  Have you Recently Had Thoughts About Hurting Someone Karolee Ohs? No  Are You Planning To Harm Someone At This Time? No  Are you currently experiencing any auditory, visual or other hallucinations? No  Have You Used Any Alcohol or Drugs in the Past 24 Hours? Yes  How long ago did you use Drugs or Alcohol? Pt reports cocaine use in the last 24 hours, 1 gram or less  What Did You Use and How Much? Pt reports cocaine use in the last 24 hours, 1 gram or less  Do you have any current medical co-morbidities that require immediate attention? No  Clinician description of patient physical appearance/behavior: Patientr presents with a pleasant affect  What Do You Feel Would Help You the Most Today? Alcohol or Drug Use Treatment  If access to [facility] was not available, would you have sought care in the Emergency Department? Yes  Determination of Need Urgent (48 hours)  Options For Referral Facility-Based Crisis

## 2023-02-22 NOTE — ED Notes (Signed)
Call placed to [facility] for specimen pick-up

## 2023-02-22 NOTE — ED Provider Notes (Signed)
[place] EMERGENCY DEPARTMENT AT [facility] Provider Note   CSN: [id number] Arrival date & time: 02/22/23  1403     History  Chief Complaint  Patient presents with   Addiction Problem    Norma Nelson is a 37 y.o. female.  HPI    37 year old female comes in with chief complaint of cocaine use disorder and wanting help with that.  Patient states that she has some anxiety, sleep issues and is on trazodone.  She has been using cocaine for several years.  Last time she tried to quit was about a year or 2 ago.  She was admitted to some facility, but has relapsed.  Her brother is at the bedside.  He states that he has intervened to help her as he can tell that she needs further assistance.  He was unaware of the severity of her addiction until now.  Patient denies any SI, HI.  She denies any hallucinations.  Patient snorts cocaine.  Denies any other substance use disorder.  Patient has no chest pain, shortness of breath, headaches.  Home Medications Prior to Admission medications   Medication Sig Start Date End Date Taking? Authorizing Provider  albuterol (VENTOLIN HFA) 108 (90 Base) MCG/ACT inhaler Inhale 2 puffs into the lungs every 6 (six) hours as needed for wheezing or shortness of breath. 08/16/22   Del Newman Nip, Tenna Child, FNP  amLODipine (NORVASC) 10 MG tablet Take 1 tablet (10 mg total) by mouth daily. 09/12/22   Gardenia Phlegm, MD  budesonide-formoterol [facility]) 80-4.5 MCG/ACT inhaler Inhale 2 puffs into the lungs 2 (two) times daily. Patient taking differently: Inhale 3 puffs into the lungs 2 (two) times daily. 08/16/22   Del Nigel Berthold, FNP  EPINEPHrine (EPIPEN 2-PAK) 0.3 mg/0.3 mL IJ SOAJ injection Inject 0.3 mg into the muscle as needed for anaphylaxis. If you use this pen, go to the emergency department immediately. 07/23/22   Gardenia Phlegm, MD  megestrol (MEGACE) 40 MG tablet Take 1-2 daily on days of heavy bleeding 05/07/22   Cyril Mourning A,  NP  metoprolol tartrate (LOPRESSOR) 25 MG tablet Take 1 tablet (25 mg total) by mouth 2 (two) times daily. 07/23/22   Gardenia Phlegm, MD  montelukast (SINGULAIR) 10 MG tablet Take 1 tablet (10 mg total) by mouth at bedtime. 08/16/22   Del Nigel Berthold, FNP  nicotine (NICODERM CQ) 7 mg/24hr patch Place 1 patch (7 mg total) onto the skin daily. 10/07/22   [name], MD  ondansetron (ZOFRAN) 4 MG tablet Take 1 tablet (4 mg total) by mouth every 8 (eight) hours as needed. 09/27/22 09/27/23  [name], MD  oxyCODONE (ROXICODONE) 5 MG immediate release tablet Take 1 tablet (5 mg total) by mouth every 4 (four) hours as needed for severe pain or breakthrough pain. 09/27/22 09/27/23  [name], MD  promethazine-dextromethorphan (PROMETHAZINE-DM) 6.25-15 MG/5ML syrup Take 5 mLs by mouth 4 (four) times daily as needed for cough. 07/28/22   White, Elita Boone, NP  triamcinolone ointment (KENALOG) 0.1 % Apply 1 Application topically 2 (two) times daily. Patient taking differently: Apply 1 Application topically 2 (two) times daily as needed (itching). 06/18/22   Del Nigel Berthold, FNP      Allergies    Amoxicillin, Hydrocodone, Lisinopril, Naproxen, Shellfish allergy, and Tramadol    Review of Systems   Review of Systems  All other systems reviewed and are negative.   Physical Exam Updated Vital Signs BP Marland Kitchen)  167/98 (BP Location: Right Arm)   Pulse 88   Temp 98.1 F (36.7 C) (Oral)   Resp 17   Ht 5\' 11"  (1.803 m)   Wt 84.4 kg   SpO2 99%   BMI 25.94 kg/m  Physical Exam Vitals and nursing note reviewed.  Constitutional:      Appearance: She is well-developed.  HENT:     Head: Atraumatic.  Cardiovascular:     Rate and Rhythm: Normal rate.  Pulmonary:     Effort: Pulmonary effort is normal.  Musculoskeletal:     Cervical back: Normal range of motion and neck supple.  Skin:    General: Skin is warm and dry.  Neurological:     Mental Status: She is alert and  oriented to person, place, and time.  Psychiatric:        Attention and Perception: Attention normal.        Mood and Affect: Affect is flat.        Speech: Speech normal.        Behavior: Behavior is slowed.        Thought Content: Thought content normal.        Cognition and Memory: Cognition normal.     ED Results / Procedures / Treatments   Labs (all labs ordered are listed, but only abnormal results are displayed) Labs Reviewed - No data to display  EKG None  Radiology No results found.  Procedures Procedures    Medications Ordered in ED Medications - No data to display  ED Course/ Medical Decision Making/ A&P                                 Medical Decision Making   Patient comes to the ER with cc of substance use disorder and wanting help for it. Patient has pertinent past medical history of anxiety. Currently patient is calm.  She is accompanied by brother who provides substantial part of the history.  Patient has no SI, HI.  Pt denies nausea, emesis, fevers, chills, chest pains, shortness of breath, headaches, abdominal pain, uti like symptoms and patient has no active medical complaints. I have reviewed previous encounters for this patient and reviewed their primary medications.  Differential diagnosis considered for this patient includes: Depression Anxiety Substance abuse Acute withdrawal  Appropriate labs have been ordered. Patient has no active psychiatric complaints at this time.  I spoke with behavioral health team.  They said that they can see her at facility based care as long as she does not have any SI, HI or psychosis.  Patient has possibly history of depression, anxiety so she might be a candidate for facility based care treatment.  Brother willing to take patient straight to b[facility]uc.  Final Clinical Impression(s) / ED Diagnoses Final diagnoses:  Cocaine abuse [facility])    Rx / DC Orders ED Discharge Orders     None         Derwood Kaplan, MD 02/22/23 [phone]

## 2023-02-22 NOTE — ED Notes (Signed)
Pt admitted to observation. Brought onto unit, familiarized with environment. Meal given. Safety checks in place per facility policy, environment secured.

## 2023-02-22 NOTE — ED Notes (Signed)
Pt A&O x 4, sleeping at present, no distress noted.  Pt under review for [facility], NP Ene [place]  reviewing labs & chart at present.

## 2023-02-22 NOTE — ED Triage Notes (Signed)
Patient states they are here for cocaine dependence, wants to get off of cocaine Last use was this morning  Denies pain  Denies SI/HI

## 2023-02-23 ENCOUNTER — Other Ambulatory Visit (HOSPITAL_COMMUNITY)
Admission: EM | Admit: 2023-02-23 | Discharge: 2023-02-26 | Disposition: A | Discharge status: Home / Self Care | Payer: MEDICAID | Attending: Psychiatry | Admitting: Psychiatry

## 2023-02-23 DIAGNOSIS — F17290 Nicotine dependence, other tobacco product, uncomplicated: Secondary | ICD-10-CM | POA: Diagnosis not present

## 2023-02-23 DIAGNOSIS — F411 Generalized anxiety disorder: Secondary | ICD-10-CM | POA: Diagnosis not present

## 2023-02-23 DIAGNOSIS — R7303 Prediabetes: Secondary | ICD-10-CM | POA: Insufficient documentation

## 2023-02-23 DIAGNOSIS — F1414 Cocaine abuse with cocaine-induced mood disorder: Secondary | ICD-10-CM | POA: Diagnosis not present

## 2023-02-23 DIAGNOSIS — F329 Major depressive disorder, single episode, unspecified: Secondary | ICD-10-CM | POA: Insufficient documentation

## 2023-02-23 DIAGNOSIS — F17200 Nicotine dependence, unspecified, uncomplicated: Secondary | ICD-10-CM

## 2023-02-23 DIAGNOSIS — F1994 Other psychoactive substance use, unspecified with psychoactive substance-induced mood disorder: Secondary | ICD-10-CM | POA: Diagnosis present

## 2023-02-23 DIAGNOSIS — Z59811 Housing instability, housed, with risk of homelessness: Secondary | ICD-10-CM | POA: Insufficient documentation

## 2023-02-23 DIAGNOSIS — I10 Essential (primary) hypertension: Secondary | ICD-10-CM | POA: Insufficient documentation

## 2023-02-23 DIAGNOSIS — F1410 Cocaine abuse, uncomplicated: Secondary | ICD-10-CM

## 2023-02-23 DIAGNOSIS — J45909 Unspecified asthma, uncomplicated: Secondary | ICD-10-CM | POA: Insufficient documentation

## 2023-02-23 DIAGNOSIS — F4310 Post-traumatic stress disorder, unspecified: Secondary | ICD-10-CM | POA: Insufficient documentation

## 2023-02-23 MED ORDER — MOMETASONE FURO-FORMOTEROL FUM 100-5 MCG/ACT IN AERO
2.0000 | INHALATION_SPRAY | Freq: Two times a day (BID) | RESPIRATORY_TRACT | Status: DC
  Administered 2023-02-23 – 2023-02-25 (×5): 2 via RESPIRATORY_TRACT
  Filled 2023-02-23 (×2): qty 8.8

## 2023-02-23 MED ORDER — METOPROLOL TARTRATE 25 MG PO TABS
25.0000 mg | ORAL_TABLET | Freq: Two times a day (BID) | ORAL | Status: DC
  Administered 2023-02-23 – 2023-02-26 (×7): 25 mg via ORAL
  Filled 2023-02-23 (×7): qty 1

## 2023-02-23 MED ORDER — AMLODIPINE BESYLATE 10 MG PO TABS
10.0000 mg | ORAL_TABLET | Freq: Every day | ORAL | Status: DC
  Administered 2023-02-23: 10 mg via ORAL
  Filled 2023-02-23: qty 1

## 2023-02-23 MED ORDER — NICOTINE POLACRILEX 2 MG MT GUM
4.0000 mg | CHEWING_GUM | Freq: Once | OROMUCOSAL | Status: AC
  Administered 2023-02-23: 4 mg via ORAL
  Filled 2023-02-23: qty 2

## 2023-02-23 MED ORDER — ACETAMINOPHEN 325 MG PO TABS
650.0000 mg | ORAL_TABLET | Freq: Four times a day (QID) | ORAL | Status: DC | PRN
  Administered 2023-02-24 – 2023-02-25 (×2): 650 mg via ORAL
  Filled 2023-02-23 (×3): qty 2

## 2023-02-23 MED ORDER — NICOTINE 21 MG/24HR TD PT24
21.0000 mg | MEDICATED_PATCH | Freq: Every day | TRANSDERMAL | Status: DC
  Administered 2023-02-23 – 2023-02-26 (×3): 21 mg via TRANSDERMAL
  Filled 2023-02-23 (×4): qty 1

## 2023-02-23 MED ORDER — EPINEPHRINE 0.3 MG/0.3ML IJ SOAJ: 0.3000 mg | INTRAMUSCULAR | Status: DC | PRN

## 2023-02-23 MED ORDER — AMLODIPINE BESYLATE 10 MG PO TABS: 10.0000 mg | ORAL_TABLET | Freq: Every day | ORAL | Status: DC

## 2023-02-23 MED ORDER — ALUM & MAG HYDROXIDE-SIMETH 200-200-20 MG/5ML PO SUSP: 30.0000 mL | ORAL | Status: DC | PRN

## 2023-02-23 MED ORDER — MAGNESIUM HYDROXIDE 400 MG/5ML PO SUSP: 30.0000 mL | Freq: Every day | ORAL | Status: DC | PRN

## 2023-02-23 MED ORDER — MIRTAZAPINE 15 MG PO TABS
15.0000 mg | ORAL_TABLET | Freq: Every day | ORAL | Status: DC
  Administered 2023-02-23 – 2023-02-25 (×3): 15 mg via ORAL
  Filled 2023-02-23 (×3): qty 1

## 2023-02-23 MED ORDER — MONTELUKAST SODIUM 10 MG PO TABS
10.0000 mg | ORAL_TABLET | Freq: Every day | ORAL | Status: DC
  Administered 2023-02-23 – 2023-02-25 (×3): 10 mg via ORAL
  Filled 2023-02-23 (×3): qty 1

## 2023-02-23 MED ORDER — HYDROXYZINE HCL 25 MG PO TABS
25.0000 mg | ORAL_TABLET | Freq: Three times a day (TID) | ORAL | Status: DC | PRN
  Filled 2023-02-23: qty 1

## 2023-02-23 MED ORDER — ALBUTEROL SULFATE HFA 108 (90 BASE) MCG/ACT IN AERS: 2.0000 | INHALATION_SPRAY | Freq: Four times a day (QID) | RESPIRATORY_TRACT | Status: DC | PRN

## 2023-02-23 NOTE — ED Notes (Signed)
Patient is sleeping. Respirations equal and unlabored, skin warm and dry. No change in assessment or acuity. Routine safety checks conducted according to facility protocol. Will continue to monitor for safety.   

## 2023-02-23 NOTE — ED Notes (Signed)
Patient in the bedroom calm and composed. NAD,respirations are even and unlabored. Will continue to monitor for safety.

## 2023-02-23 NOTE — [facility] Group Notes (Signed)
PsychoEducational Group Group Topic: Positive Reframing and the impact of negative thoughts on mood. Patients were given education on the impact of negative thoughts, and then asked to share one thought they felt was negative that was impacting their mood, that they would like to let go of.  The patient attended and was appropriate.

## 2023-02-23 NOTE — ED Notes (Signed)
Patient in the Dayroom watching TV with other patients. NAD. Denies SI/HI/AVH. Respirations are even and unlabored. Will continue to monitor for safety.

## 2023-02-23 NOTE — Group Note (Signed)
Group Topic: Positive Affirmations  Group Date: 02/23/2023 Start Time: 0950 End Time: 1020 Facilitators: Londell Moh, NT  Department: [facility]  Number of Participants: 4  Group Focus: check in, daily focus, and goals/reality orientation Treatment Modality:  Psychoeducation Interventions utilized were patient education Purpose: increase insight  Name: Norma Nelson Date of Birth: 22-Feb-1986  MR: [id number]    Level of Participation: Pt did not attend group, was in a meeting with MD.  Patients Problems:  Patient Active Problem List   Diagnosis Date Noted   Substance induced mood disorder (HCC) 02/23/2023   Upper respiratory infection 08/16/2022   History of asthma 08/16/2022   Breast abscess 05/07/2022   Menorrhagia with regular cycle 05/07/2022   Encounter for gynecological examination with Papanicolaou smear of cervix 05/07/2022   Muscle strain 01/04/2021   Establishing care with new doctor, encounter for 12/19/2020   Breast pain, right 12/19/2020   Food allergy 12/19/2020   Kidney stone 12/19/2020   Eczema 12/19/2020   Papanicolaou smear of cervix with positive high risk human papilloma virus (HPV) test 09/09/2016   Leukocytosis 10/19/2015   Prediabetes 10/19/2015   Primary hypertension 09/14/2015   Morbid obesity (HCC) 09/14/2015   Cigarette nicotine dependence without complication 09/14/2015

## 2023-02-23 NOTE — ED Notes (Signed)
Pt was provided dinner.

## 2023-02-23 NOTE — Group Note (Signed)
Group Topic: Fears and Unhealthy Coping Skills  Group Date: 02/23/2023 Start Time: 2012 End Time: 2045 Facilitators: Vassie Loll, RN  Department: [facility]  Number of Participants: 7  Group Focus: coping skills Treatment Modality:  Psychoeducation Interventions utilized were support Purpose: enhance coping skills  Name: Norma Nelson Date of Birth: Oct 31, 1985  MR: [id number]    Level of Participation: moderate Quality of Participation: attentive Interactions with others: gave feedback Mood/Affect: appropriate Triggers (if applicable):   Cognition: coherent/clear Progress: Moderate Response:   Plan: follow-up needed  Patients Problems:  Patient Active Problem List   Diagnosis Date Noted   Substance induced mood disorder (HCC) 02/23/2023   Upper respiratory infection 08/16/2022   History of asthma 08/16/2022   Breast abscess 05/07/2022   Menorrhagia with regular cycle 05/07/2022   Encounter for gynecological examination with Papanicolaou smear of cervix 05/07/2022   Muscle strain 01/04/2021   Establishing care with new doctor, encounter for 12/19/2020   Breast pain, right 12/19/2020   Food allergy 12/19/2020   Kidney stone 12/19/2020   Eczema 12/19/2020   Papanicolaou smear of cervix with positive high risk human papilloma virus (HPV) test 09/09/2016   Leukocytosis 10/19/2015   Prediabetes 10/19/2015   Primary hypertension 09/14/2015   Morbid obesity (HCC) 09/14/2015   Cigarette nicotine dependence without complication 09/14/2015
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
## 2023-02-23 NOTE — ED Notes (Addendum)
Pt approached and found resting in bed, however she was awake upon entry to room. Cacey reports that she recently relapsed and that '' I want to go to rehab '' she reports '' I was on trazodone and I had been off it for a year. But otherwise I don't take anything '' she reports sleeping well last night and denies any SI HI or AV Hallucinations. She is able to make her needs known, and expresses no immediate concerns at this time. Discussed unit and ward rules. Pt is safe, will con't to monitor.

## 2023-02-23 NOTE — ED Notes (Signed)
Pt was provided breakfast.

## 2023-02-24 ENCOUNTER — Encounter (HOSPITAL_COMMUNITY): Payer: Self-pay

## 2023-02-24 DIAGNOSIS — F1414 Cocaine abuse with cocaine-induced mood disorder: Secondary | ICD-10-CM | POA: Diagnosis not present

## 2023-02-24 DIAGNOSIS — F17290 Nicotine dependence, other tobacco product, uncomplicated: Secondary | ICD-10-CM | POA: Diagnosis not present

## 2023-02-24 DIAGNOSIS — F411 Generalized anxiety disorder: Secondary | ICD-10-CM | POA: Diagnosis not present

## 2023-02-24 DIAGNOSIS — F329 Major depressive disorder, single episode, unspecified: Secondary | ICD-10-CM | POA: Diagnosis not present

## 2023-02-24 LAB — COMPREHENSIVE METABOLIC PANEL
ALT: 19 U/L (ref 0–44)
AST: 18 U/L (ref 15–41)
Albumin: 2.9 g/dL — ABNORMAL LOW (ref 3.5–5.0)
Alkaline Phosphatase: 91 U/L (ref 38–126)
Anion gap: 7 (ref 5–15)
BUN: 9 mg/dL (ref 6–20)
CO2: 26 mmol/L (ref 22–32)
Calcium: 10 mg/dL (ref 8.9–10.3)
Chloride: 107 mmol/L (ref 98–111)
Creatinine, Ser: 0.99 mg/dL (ref 0.44–1.00)
GFR, Estimated: >60 mL/min (ref >60)
Glucose, Bld: 101 mg/dL — ABNORMAL HIGH (ref 70–99)
Potassium: 3.7 mmol/L (ref 3.5–5.1)
Sodium: 140 mmol/L (ref 135–145)
Total Bilirubin: 0.3 mg/dL (ref <1.2)
Total Protein: 6.3 g/dL — ABNORMAL LOW (ref 6.5–8.1)

## 2023-02-24 LAB — CBC WITH DIFFERENTIAL/PLATELET
Abs Immature Granulocytes: 0.11 10*3/uL — ABNORMAL HIGH (ref 0.00–0.07)
Basophils Absolute: 0 10*3/uL (ref 0.0–0.1)
Basophils Relative: 0 %
Eosinophils Absolute: 1.2 10*3/uL — ABNORMAL HIGH (ref 0.0–0.5)
Eosinophils Relative: 6 %
HCT: 34.9 % — ABNORMAL LOW (ref 36.0–46.0)
Hemoglobin: 11.4 g/dL — ABNORMAL LOW (ref 12.0–15.0)
Immature Granulocytes: 1 %
Lymphocytes Relative: 19 %
Lymphs Abs: 3.6 10*3/uL (ref 0.7–4.0)
MCH: 27.4 pg (ref 26.0–34.0)
MCHC: 32.7 g/dL (ref 30.0–36.0)
MCV: 83.9 fL (ref 80.0–100.0)
Monocytes Absolute: 1.9 10*3/uL — ABNORMAL HIGH (ref 0.1–1.0)
Monocytes Relative: 10 %
Neutro Abs: 12.5 10*3/uL — ABNORMAL HIGH (ref 1.7–7.7)
Neutrophils Relative %: 64 %
Platelets: 379 10*3/uL (ref 150–400)
RBC: 4.16 MIL/uL (ref 3.87–5.11)
RDW: 14 % (ref 11.5–15.5)
WBC: 19.3 10*3/uL — ABNORMAL HIGH (ref 4.0–10.5)
nRBC: 0 % (ref 0.0–0.2)

## 2023-02-24 MED ORDER — AMLODIPINE BESYLATE 10 MG PO TABS
10.0000 mg | ORAL_TABLET | Freq: Every day | ORAL | Status: DC
  Administered 2023-02-24 – 2023-02-26 (×3): 10 mg via ORAL
  Filled 2023-02-24 (×3): qty 1

## 2023-02-24 NOTE — ED Notes (Addendum)
Patient in the bedroom calm and sleeping. NAD,respirations are even and unlabored. Will continue to monitor for safety.

## 2023-02-24 NOTE — BH IP Treatment Plan (Signed)
Interdisciplinary Treatment and Diagnostic Plan Update  02/24/2023 Time of Session: 11:20AM Norma Nelson MRN: [id number]  Diagnosis:  Final diagnoses:  Cocaine use disorder (HCC)  Nicotine use disorder     Current Medications:  Current Facility-Administered Medications  Medication Dose Route Frequency Provider Last Rate Last Admin   acetaminophen (TYLENOL) tablet 650 mg  650 mg Oral Q6H PRN White, Patrice L, NP       albuterol (VENTOLIN HFA) 108 (90 Base) MCG/ACT inhaler 2 puff  2 puff Inhalation Q6H PRN White, Patrice L, NP       alum & mag hydroxide-simeth (MAALOX/MYLANTA) 200-200-20 MG/5ML suspension 30 mL  30 mL Oral Q4H PRN White, Patrice L, NP       amLODipine (NORVASC) tablet 10 mg  10 mg Oral Daily Karie Fetch, MD   10 mg at 02/24/23 0946   EPINEPHrine (EPI-PEN) injection 0.3 mg  0.3 mg Intramuscular PRN Princess Bruins, DO       hydrOXYzine (ATARAX) tablet 25 mg  25 mg Oral TID PRN White, Patrice L, NP       magnesium hydroxide (MILK OF MAGNESIA) suspension 30 mL  30 mL Oral Daily PRN White, Patrice L, NP       metoprolol tartrate (LOPRESSOR) tablet 25 mg  25 mg Oral BID White, Patrice L, NP   25 mg at 02/24/23 0917   mirtazapine (REMERON) tablet 15 mg  15 mg Oral QHS White, Patrice L, NP   15 mg at 02/23/23 2114   mometasone-formoterol (DULERA) 100-5 MCG/ACT inhaler 2 puff  2 puff Inhalation BID Princess Bruins, DO   2 puff at 02/24/23 0919   montelukast (SINGULAIR) tablet 10 mg  10 mg Oral QHS White, Patrice L, NP   10 mg at 02/23/23 2114   nicotine (NICODERM CQ - dosed in mg/24 hours) patch 21 mg  21 mg Transdermal Daily Princess Bruins, DO   21 mg at 02/24/23 4132   Current Outpatient Medications  Medication Sig Dispense Refill   megestrol (MEGACE) 40 MG tablet Take 120 mg by mouth daily. Take 2-3 days when period starts for heavy bleeding     albuterol (VENTOLIN HFA) 108 (90 Base) MCG/ACT inhaler Inhale 2 puffs into the lungs every 6 (six) hours as needed for wheezing  or shortness of breath. 8 g 2   amLODipine (NORVASC) 10 MG tablet Take 1 tablet (10 mg total) by mouth daily. 90 tablet 0   budesonide-formoterol (SYMBICORT) 80-4.5 MCG/ACT inhaler Inhale 2 puffs into the lungs 2 (two) times daily. 1 each 3   EPINEPHrine (EPIPEN 2-PAK) 0.3 mg/0.3 mL IJ SOAJ injection Inject 0.3 mg into the muscle as needed for anaphylaxis. If you use this pen, go to the emergency department immediately. 1 each 1   metoprolol tartrate (LOPRESSOR) 25 MG tablet Take 1 tablet (25 mg total) by mouth 2 (two) times daily. 90 tablet 0   montelukast (SINGULAIR) 10 MG tablet Take 1 tablet (10 mg total) by mouth at bedtime. 30 tablet 3   PTA Medications: Prior to Admission medications   Medication Sig Start Date End Date Taking? Authorizing Provider  megestrol (MEGACE) 40 MG tablet Take 120 mg by mouth daily. Take 2-3 days when period starts for heavy bleeding   Yes [provider]  albuterol (VENTOLIN HFA) 108 (90 Base) MCG/ACT inhaler Inhale 2 puffs into the lungs every 6 (six) hours as needed for wheezing or shortness of breath. 08/16/22   Del Newman Nip, Tenna Child, FNP  amLODipine (NORVASC) 10  MG tablet Take 1 tablet (10 mg total) by mouth daily. 09/12/22   Gardenia Phlegm, MD  budesonide-formoterol [facility]) 80-4.5 MCG/ACT inhaler Inhale 2 puffs into the lungs 2 (two) times daily. 08/16/22   Del Nigel Berthold, FNP  EPINEPHrine (EPIPEN 2-PAK) 0.3 mg/0.3 mL IJ SOAJ injection Inject 0.3 mg into the muscle as needed for anaphylaxis. If you use this pen, go to the emergency department immediately. 07/23/22   Gardenia Phlegm, MD  metoprolol tartrate (LOPRESSOR) 25 MG tablet Take 1 tablet (25 mg total) by mouth 2 (two) times daily. 07/23/22   Gardenia Phlegm, MD  montelukast (SINGULAIR) 10 MG tablet Take 1 tablet (10 mg total) by mouth at bedtime. 08/16/22   Del Nigel Berthold, FNP    Patient Stressors: Marital or family conflict   Substance abuse   Traumatic event    Patient  Strengths: Ability for insight  Active sense of humor  Average or above average intelligence  Capable of independent living  Communication skills  General fund of knowledge  Motivation for treatment/growth  Work skills   Treatment Modalities: Medication Management, Group therapy, Case management,  1 to 1 session with clinician, Psychoeducation, Recreational therapy.   Physician Treatment Plan for Primary and Secondary Diagnosis:  Final diagnoses:  Cocaine use disorder (HCC)  Nicotine use disorder   Long Term Goal(s):    Short Term Goals:    Medication Management: Evaluate patient's response, side effects, and tolerance of medication regimen.  Therapeutic Interventions: 1 to 1 sessions, Unit Group sessions and Medication administration.  Evaluation of Outcomes: Progressing  LCSW Treatment Plan for Primary Diagnosis:  Final diagnoses:  Cocaine use disorder (HCC)  Nicotine use disorder    Long Term Goal(s): Safe transition to appropriate next level of care at discharge.  Short Term Goals: Facilitate acceptance of mental health diagnosis and concerns through verbal commitment to aftercare plan and appointments at discharge., Patient will identify one social support prior to discharge to aid in patient's recovery., Patient will attend AA/NA groups as scheduled., Identify minimum of 2 triggers associated with mental health/substance abuse issues with treatment team members., and Increase skills for wellness and recovery by attending 50% of scheduled groups.  Therapeutic Interventions: Assess for all discharge needs, 1 to 1 time with Child psychotherapist, Explore available resources and support systems, Assess for adequacy in community support network, Educate family and significant other(s) on suicide prevention, Complete Psychosocial Assessment, Interpersonal group therapy.  Evaluation of Outcomes: Progressing   Progress in Treatment: Attending groups: Yes. Participating in groups:  Yes. Taking medication as prescribed: Yes. Toleration medication: Yes. Family/Significant other contact made: No, will contact:  Patient declined collateral at this time. LCSW will follow up at a later time to confirm.  Patient understands diagnosis: Yes. Discussing patient identified problems/goals with staff: Yes. Medical problems stabilized or resolved: Yes. Denies suicidal/homicidal ideation: Yes. Issues/concerns per patient self-inventory: Yes. Other: substance use and need for further treatment  New problem(s) identified: No, Describe:  other than reported on admission.   New Short Term/Long Term Goal(s): Safe transition to appropriate next level of care at discharge, Engage patient in therapeutic group addressing interpersonal concerns. Engage patient in aftercare planning with referrals and resources, Increase ability to appropriately verbalize feelings, Facilitate acceptance of mental health diagnosis and concerns and Identify triggers associated with mental health/substance abuse issues.   Patient Goals:  Patient is seeking residential placement at this time for substance use within 3 to 5 days.   Discharge Plan  or Barriers: LCSW will send referrals out for review for residential placement. Updates will be provided as received.    Reason for Continuation of Hospitalization: Withdrawal symptoms  Estimated Length of Stay: 3-5 days  Last 3 [place] Suicide Severity Risk Score: Flowsheet Row ED from 02/23/2023 in [facility] Most recent reading at 02/23/2023  1:05 AM ED from 02/22/2023 in [facility] Most recent reading at 02/22/2023  5:21 PM ED from 02/22/2023 in [facility] Emergency Department at [facility] Most recent reading at 02/22/2023  2:17 PM  C-SSRS RISK CATEGORY No Risk No Risk No Risk       Last PHQ 2/9 Scores:    02/22/2023    4:58 PM 09/18/2022    9:03 AM 08/16/2022    8:16 AM  Depression screen PHQ  2/9  Decreased Interest 3 0 0  Down, Depressed, Hopeless 2 0 0  PHQ - 2 Score 5 0 0  Altered sleeping 3  0  Tired, decreased energy 2  0  Change in appetite 2  0  Feeling bad or failure about yourself  3  0  Trouble concentrating 3  0  Moving slowly or fidgety/restless 0  0  Suicidal thoughts 0  0  PHQ-9 Score 18  0  Difficult doing work/chores Very difficult  Not difficult at all    Scribe for Treatment Team: Loleta Dicker, LCSW 02/24/2023 4:33 PM

## 2023-02-24 NOTE — ED Notes (Signed)
Received a call from Morrell Riddle at [facility] lab stating patient will require labs to be redrawn r/t non-labeled. The Charge nurse for [facility] was made aware.

## 2023-02-24 NOTE — ED Notes (Signed)
Assumed care of patient this pm, she was lying I bed sleeping, c/o hypersomnia, A&O x4, denied withdrawal sx and without SI, HI, plan or intent. Pt spent the majority of the evening in her room, affect and mood seems depressed and sad, however she was evasive around any particular issues. Pt compliant with taking her medications and denied having any side effects. She had minimum interactions with others and without any further complaints. Will continue to monitor and support according to plan of care.

## 2023-02-24 NOTE — Tx Team (Signed)
LCSW, MD, and Resident met with patient to assess current mood, affect, physical state, and inquire about needs/goals while here in [facility] and after discharge.   The patient is a 37 year old female, presents voluntarily to the [facility], accompanied by her brother, to seek treatment for substance abuse and associated depressive symptoms. Pt has a history of insomnia, anxiety, and documented depression with previous suicidal ideation (SI). She reports long-standing depressive symptoms, which she links to her mother's passing in 2019. Her symptoms include social isolation, hopelessness, guilt, crying spells, feelings of worthlessness, and persistent anxiety, often marked by worry and a sense of impending doom. Pt shared "This time I wanted to come and do it for myself, every other time I came for other people. I know I need to do something different".  She reports a three-year history of daily cocaine use, spending approximately $200 daily on cocaine, with her last use being the morning of her visit. Pt reports using cocaine by snorting, with no other illicit drug or alcohol use. She attended substance abuse treatment at [facility] two years ago but did not find lasting success in her recovery efforts. Pt presents with a complex clinical picture involving cocaine dependence, persistent depressive symptoms, anxiety, and a history of insomnia. Her addiction has exacerbated her depression, led to significant weight loss, and disrupted her interpersonal relationships. Pt reports not having a strong support system, however cites her wife of 15 years as her primary source of support.   Pt is interested in an intensive outpatient program and also expressed interest in grief counseling. Pt was calm and cooperative, alert and oriented x4, showed insight into her current illness and denied SI/HI/AH/VH. Patient aware that LCSW will follow up with outpatient providers regarding availability of services, and will follow back up with  patient to provide updates. Patient expressed understanding and appreciation of LCSW assistance. No other needs were reported at this time by patient.   LCSW will continue to follow and provide support to patient while on [facility] unit.   Fernande Boyden, LCSW Clinical Social Worker [facility] Ph: [phone]

## 2023-02-24 NOTE — ED Notes (Signed)
Patient had a concern of skin redness or skin breakdown related to new medications she is taking. Patient stated "my skin has been fine until they gave me some new medication here". Skin assessed,RN could not tell the difference. Pt reassured and will communicate to the incoming staff.

## 2023-02-24 NOTE — Group Note (Signed)
Group Topic: Decisional Balance/Substance Abuse  Group Date: 02/24/2023 Start Time: 1000 End Time: 1104 Facilitators: Vonzell Schlatter B  Department: [facility]  Number of Participants: 5  Group Focus: chemical dependency education and daily focus Treatment Modality:  Psychoeducation Interventions utilized were group exercise and support Purpose: relapse prevention strategies  Name: Norma Nelson Date of Birth: 02-24-1986  MR: [id number]    Level of Participation: moderate Quality of Participation: attentive and cooperative Interactions with others: gave feedback  Mood/Affect: positive Triggers (if applicable): n/a Cognition: coherent/clear Progress: Moderate Response: n/a Plan: follow-up needed  Patients Problems:  Patient Active Problem List   Diagnosis Date Noted   Substance induced mood disorder (HCC) 02/23/2023   Upper respiratory infection 08/16/2022   History of asthma 08/16/2022   Breast abscess 05/07/2022   Menorrhagia with regular cycle 05/07/2022   Encounter for gynecological examination with Papanicolaou smear of cervix 05/07/2022   Muscle strain 01/04/2021   Establishing care with new doctor, encounter for 12/19/2020   Breast pain, right 12/19/2020   Food allergy 12/19/2020   Kidney stone 12/19/2020   Eczema 12/19/2020   Papanicolaou smear of cervix with positive high risk human papilloma virus (HPV) test 09/09/2016   Leukocytosis 10/19/2015   Prediabetes 10/19/2015   Primary hypertension 09/14/2015   Morbid obesity (HCC) 09/14/2015   Cigarette nicotine dependence without complication 09/14/2015

## 2023-02-24 NOTE — Group Note (Signed)
Group Topic: Communication  Group Date: 02/24/2023 Start Time: 2000 End Time: 2115 Facilitators: Rae Lips B  Department: [facility]  Number of Participants: 1  Group Focus: abuse issues, acceptance, activities of daily living skills, communication, and coping skills Treatment Modality:  Exposure Therapy, Individual Therapy, and Leisure Development Interventions utilized were leisure development and problem solving Purpose: enhance coping skills, express feelings, express irrational fears, relapse prevention strategies, and trigger / craving management  Name: Norma Nelson Date of Birth: 12/25/1985  MR: [id number]    Level of Participation: PT DID NOT ATTEND GROUPS Quality of Participation: cooperative Interactions with others: gave feedback Mood/Affect: appropriate Triggers (if applicable): NA Cognition: coherent/clear Progress: None Response: NA Plan: patient will be encouraged to go to groups.   Patients Problems:  Patient Active Problem List   Diagnosis Date Noted   Substance induced mood disorder (HCC) 02/23/2023   Upper respiratory infection 08/16/2022   History of asthma 08/16/2022   Breast abscess 05/07/2022   Menorrhagia with regular cycle 05/07/2022   Encounter for gynecological examination with Papanicolaou smear of cervix 05/07/2022   Muscle strain 01/04/2021   Establishing care with new doctor, encounter for 12/19/2020   Breast pain, right 12/19/2020   Food allergy 12/19/2020   Kidney stone 12/19/2020   Eczema 12/19/2020   Papanicolaou smear of cervix with positive high risk human papilloma virus (HPV) test 09/09/2016   Leukocytosis 10/19/2015   Prediabetes 10/19/2015   Primary hypertension 09/14/2015   Morbid obesity (HCC) 09/14/2015   Cigarette nicotine dependence without complication 09/14/2015

## 2023-02-24 NOTE — Group Note (Signed)
Group Topic: Recovery Basics  Group Date: 02/24/2023 Start Time: 1300 End Time: 1320 Facilitators: Jenean Lindau, RN  Department: [facility]  Number of Participants: 5  Group Focus: chemical dependency education Treatment Modality:  Behavior Modification Therapy Interventions utilized were exploration Purpose: express feelings, express irrational fears, improve communication skills, increase insight, regain self-worth, reinforce self-care, and relapse prevention strategies  Name: Norma Nelson Date of Birth: 1985/08/14  MR: [id number]    Level of Participation: active Quality of Participation: attentive and cooperative Interactions with others: gave feedback Mood/Affect: appropriate Triggers (if applicable):   Cognition: goal directed Progress: Gaining insight Response:   Plan: follow-up needed  Patients Problems:  Patient Active Problem List   Diagnosis Date Noted   Substance induced mood disorder (HCC) 02/23/2023   Upper respiratory infection 08/16/2022   History of asthma 08/16/2022   Breast abscess 05/07/2022   Menorrhagia with regular cycle 05/07/2022   Encounter for gynecological examination with Papanicolaou smear of cervix 05/07/2022   Muscle strain 01/04/2021   Establishing care with new doctor, encounter for 12/19/2020   Breast pain, right 12/19/2020   Food allergy 12/19/2020   Kidney stone 12/19/2020   Eczema 12/19/2020   Papanicolaou smear of cervix with positive high risk human papilloma virus (HPV) test 09/09/2016   Leukocytosis 10/19/2015   Prediabetes 10/19/2015   Primary hypertension 09/14/2015   Morbid obesity (HCC) 09/14/2015   Cigarette nicotine dependence without complication 09/14/2015

## 2023-02-24 NOTE — ED Notes (Signed)
Pt currently resting quietly

## 2023-02-24 NOTE — ED Notes (Signed)
MHT was doing group from 2000-2115

## 2023-02-24 NOTE — ED Notes (Signed)
MHT went and got clothes for pt out of her locker. And gave her an extra blanket.

## 2023-02-24 NOTE — ED Notes (Signed)
Patient is asleep in bed at this time.  No distress or complaint .  Will monitor.
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
## 2023-02-25 DIAGNOSIS — F17290 Nicotine dependence, other tobacco product, uncomplicated: Secondary | ICD-10-CM | POA: Diagnosis not present

## 2023-02-25 DIAGNOSIS — F1414 Cocaine abuse with cocaine-induced mood disorder: Secondary | ICD-10-CM | POA: Diagnosis not present

## 2023-02-25 DIAGNOSIS — F329 Major depressive disorder, single episode, unspecified: Secondary | ICD-10-CM | POA: Diagnosis not present

## 2023-02-25 DIAGNOSIS — F411 Generalized anxiety disorder: Secondary | ICD-10-CM | POA: Diagnosis not present

## 2023-02-25 NOTE — Group Note (Signed)
Group Topic: Healthy Self Image and Positive Change  Group Date: 02/25/2023 Start Time: 1130 End Time: 1200 Facilitators: Wonda Cheng, LPN  Department: [facility]  Number of Participants: 5  Group Focus: acceptance, affirmation, communication, feeling awareness/expression, nursing group, and self-awareness Treatment Modality:  Patient-Centered Therapy Interventions utilized were group exercise Purpose: enhance coping skills, express feelings, express irrational fears, and increase insight  Name: Norma Nelson Date of Birth: 1986/02/24  MR: [id number]    Level of Participation: active Quality of Participation: attentive, cooperative, engaged, offered feedback, and supportive Interactions with others: gave feedback Mood/Affect: appropriate and positive Triggers (if applicable): n/a Cognition: coherent/clear, concrete, goal directed, insightful, and logical Progress: Gaining insight Response: Appropriate respsonse to group session Plan: follow-up needed  Patients Problems:  Patient Active Problem List   Diagnosis Date Noted   Substance induced mood disorder (HCC) 02/23/2023   Upper respiratory infection 08/16/2022   History of asthma 08/16/2022   Breast abscess 05/07/2022   Menorrhagia with regular cycle 05/07/2022   Encounter for gynecological examination with Papanicolaou smear of cervix 05/07/2022   Muscle strain 01/04/2021   Establishing care with new doctor, encounter for 12/19/2020   Breast pain, right 12/19/2020   Food allergy 12/19/2020   Kidney stone 12/19/2020   Eczema 12/19/2020   Papanicolaou smear of cervix with positive high risk human papilloma virus (HPV) test 09/09/2016   Leukocytosis 10/19/2015   Prediabetes 10/19/2015   Primary hypertension 09/14/2015   Morbid obesity (HCC) 09/14/2015   Cigarette nicotine dependence without complication 09/14/2015
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
## 2023-02-25 NOTE — Group Note (Signed)
Group Topic: Goal Setting  Group Date: 02/25/2023 Start Time: 1330 End Time: 1430 Facilitators: Loleta Dicker, LCSW  Department: [facility]  Number of Participants: 7  Group Focus: acceptance, affirmation, chemical dependency issues, clarity of thought, communication, coping skills, daily focus, depression, feeling awareness/expression, goals/reality orientation, personal responsibility, problem solving, relapse prevention, self-awareness, social skills, and substance abuse education Treatment Modality:  Cognitive Behavioral Therapy, Interpersonal Therapy, Patient-Centered Therapy, Skills Training, and Solution-Focused Therapy Interventions utilized were clarification, confrontation, exploration, mental fitness, patient education, problem solving, story telling, and support Purpose: enhance coping skills, explore maladaptive thinking, express feelings, express irrational fears, improve communication skills, increase insight, regain self-worth, reinforce self-care, relapse prevention strategies, and trigger / craving management    Name: Norma Nelson Date of Birth: November 07, 1985  MR: [id number]    Level of Participation: active Quality of Participation: attentive, cooperative, and engaged Interactions with others: gave feedback Mood/Affect: brightens with interaction Triggers (if applicable):  Cognition: insightful Progress: Gaining insight Response: "I need to stay focus and do what I set out to do so I can graduate and help people like myself know that there is a way out. Success is possible for people like me" Plan: patient will be encouraged to attend groups while on unit  Summary of Patient Progress: Patient actively participated in group on today. Patient was able to identify areas or times in her life where she felt like it was harder to focus on the good rather than the negative. Patient reports that the video clip definitely made her reflect on the way  she has probably treated others in the past. Patient reported that at times it is hard think about how your reactions may have affect another person. Patient reports feeling encouraged from watching the video, and she was able to provide feedback to staff and peers.   Patients Problems:  Patient Active Problem List   Diagnosis Date Noted   Substance induced mood disorder (HCC) 02/23/2023   Upper respiratory infection 08/16/2022   History of asthma 08/16/2022   Breast abscess 05/07/2022   Menorrhagia with regular cycle 05/07/2022   Encounter for gynecological examination with Papanicolaou smear of cervix 05/07/2022   Muscle strain 01/04/2021   Establishing care with new doctor, encounter for 12/19/2020   Breast pain, right 12/19/2020   Food allergy 12/19/2020   Kidney stone 12/19/2020   Eczema 12/19/2020   Papanicolaou smear of cervix with positive high risk human papilloma virus (HPV) test 09/09/2016   Leukocytosis 10/19/2015   Prediabetes 10/19/2015   Primary hypertension 09/14/2015   Morbid obesity (HCC) 09/14/2015   Cigarette nicotine dependence without complication 09/14/2015

## 2023-02-25 NOTE — ED Notes (Signed)
Attending social work group.

## 2023-02-25 NOTE — ED Notes (Signed)
Accepted and assumed care of patient of patient this pm, she presented A&O x4, affect and mood seems brighter than in previous days, she is more engaged with peers and focused on been discharged Thursday. Pt denied having thoughts, plan or intent to harm self or others feels her depression is "better". Pt is compliant with her medication regime and denied having side effects and.pt denied having pain or discomfort. Will continue to monitor and and educate patient on her disease, medication and other plan of care.

## 2023-02-25 NOTE — ED Notes (Signed)
Patient was provided lunch.

## 2023-02-25 NOTE — Discharge Instructions (Signed)
Intake Appointment has been arranged for Friday February 28, 2023 at 10:30am with Norma Nelson. Please arrive 15 minutes prior to appointment for check in. Please present photo ID and insurance card to front desk.   [facility] [address], [place], [address] [phone] phone  New Patient Assessment/Therapy Walk-Ins:  Monday and Wednesday: 8 am until slots are full. Every 1st and 2nd Fridays of the month: 1 pm - 5 pm.  NO ASSESSMENT/THERAPY WALK-INS ON TUESDAYS OR THURSDAYS  New Patient Assessment/Medication Management Walk-Ins:  Monday - Friday:  8 am - 11 am.  For all walk-ins, we ask that you arrive by 7:30 am because patients will be seen in the order of arrival.  Availability is limited; therefore, you may not be seen on the same day that you walk-in.  Our goal is to serve and meet the needs of our community to the best of our ability.  SUBSTANCE USE TREATMENT for Medicaid and State Funded/IPRS  [facility] [address], [place], [address] [phone] phone NOTE: [facility] is no longer offering IOP services.  Serves those who are low-income or have no insurance.  [facility] [address], [place], [place], [address] [phone] phone [phone] fax NOTE: Does have Substance Abuse-Intensive Outpatient Program [facility]) as well as transitional housing if eligible.  [facility] [address] [place], [place], [address] [phone] phone [phone] fax  [facility] [phone] [address] [place], [place], [address] [phone] phone [phone] fax  HALFWAY HOUSES:  [facility] [phone]  [employer].oxfordvacancies.com  12 STEP PROGRAMS:  Alcoholics Anonymous of [place] [employer]et[employer]  Narcotics Anonymous of [place] [employer]dk  Al-Anon of [place], [place]  www.[place]alanon.org/find-meetings.html  Nar-Anon https://nar-anon.org/find-a-meetin  List of Residential placements:   [facility] in [place]: [phone]  [facility]: [phone]  [place], [place] [phone]: Female and female facility; 30-day program: (uninsured and Medicaid such as Laurena Bering, [place], [place], partners)  [facility]: [phone]; men and women's facility; 28 days; Can have Medicaid tailored plan Tour manager or Partners)  [facility]: [phone] Karoline Caldwell or Larita Fife; 28 day program; must be fully detox; tailored Medicaid or no insurance  [address] in [place], [place]; [phone]; 28 day all males program; no insurance accepted  [facility] Referral in [place]: Gabriel Rung [phone] (no insurance or Medicaid only); 90 days; outpatient services but provide housing in apartments [place]  [facility] Admission: [phone]: Patient must complete phone screening for placement: [place], [place]; 6 month program; uninsured, Medicaid, and [employer].   [facility]: no insurance required; [phone]  [facility]: [phone]; Intake: Molly Maduro; Must fill out application online; Alecia Lemming Delay [phone] x [address] [facility] in [place], [place]: [phone]; Admissions Coordinators Mr. Maurine Minister or Barron Alvine; 90 day program.  [facility]: [place], [place] [phone]; Co-Ed 9 month to a year program; Online application; Men entry fee is $500 (6-40months);  [employer]: [address] [place], [place] [address]; no fee or insurance required; minimum of 2 years; Highly structured; work based; Intake Coordinator is Thayer Ohm [phone]  [facility] in [place], [place]: [phone]; Fax number is [phone]; website: www.[facility].org; Requires 3-6 page autobiography; 2 year program (18 months and then 71month transitional housing);  Admission fee is $300; no insurance needed; work Automotive engineer in [place], [place]: [employer] Desk Staff: Danise Edge [phone]: They have a Men's Regenerations Program 6-27months. Free program; There is  an initial $300 fee however, they are willing to work with patients regarding that. Application is online.  [facility]: Admissions [phone] Doran Heater ext [phone]; Any 7-90 day program is out of pocket; 12 month program is free of charge; there is a $275 entry fee; Patient is responsible for own transportation

## 2023-02-25 NOTE — Group Note (Signed)
Group Topic: Positive Affirmations  Group Date: 02/25/2023 Start Time: 1115 End Time: 1215 Facilitators: Cassandria Anger  Department: [facility]  Number of Participants: 7  Group Focus: affirmation Treatment Modality:  Psychoeducation Interventions utilized were patient education Purpose: increase insight  Name: Norma Nelson Date of Birth: Sep 06, 1985  MR: [id number]    Level of Participation: active Quality of Participation: cooperative Interactions with others: gave feedback Mood/Affect: appropriate Triggers (if applicable):   Cognition: goal directed Progress: Gaining insight Response:   Plan: follow-up needed  Patients Problems:  Patient Active Problem List   Diagnosis Date Noted   Substance induced mood disorder (HCC) 02/23/2023   Upper respiratory infection 08/16/2022   History of asthma 08/16/2022   Breast abscess 05/07/2022   Menorrhagia with regular cycle 05/07/2022   Encounter for gynecological examination with Papanicolaou smear of cervix 05/07/2022   Muscle strain 01/04/2021   Establishing care with new doctor, encounter for 12/19/2020   Breast pain, right 12/19/2020   Food allergy 12/19/2020   Kidney stone 12/19/2020   Eczema 12/19/2020   Papanicolaou smear of cervix with positive high risk human papilloma virus (HPV) test 09/09/2016   Leukocytosis 10/19/2015   Prediabetes 10/19/2015   Primary hypertension 09/14/2015   Morbid obesity (HCC) 09/14/2015   Cigarette nicotine dependence without complication 09/14/2015

## 2023-02-25 NOTE — Discharge Planning (Signed)
LCSW spoke with patient on this morning regarding current need. Patient asked if LCSW for follow-up with her HUD representative for her housing authority in [place], [place]. Patient made aware that LCSW typically does not get involved with personal matters, however due to the patient's fear of losing her housing voucher LCSW will contact to inform of admission. Patient asked if LCSW will report that she is having a "Mental Health Crisis". LCSW informed patient that additional information will not be provided outside of her admission. Contact number provided [phone] extension [phone].  LCSW contacted Ms. Scales on this morning and left private voicemail regarding patient's admission into the [facility].  No further information was provided, however LCSW advised representative to follow up as needed. LCSW to speak with patient to inform that contact has been made. Work letter will also be sent to the patient's group home supervisor Ms.Aram Beecham at number provided [phone]. No other needs were reported at this time.  LCSW will continue to follow and provide support to patient while on FBC unit.  Fernande Boyden, LCSW Clinical Social Worker [place] [facility] Ph: [phone]

## 2023-02-25 NOTE — ED Notes (Signed)
 Patient was provided dinner

## 2023-02-25 NOTE — Group Note (Signed)
Group Topic: Social Support  Group Date: 02/25/2023 Start Time: 0730 End Time: 0800 Facilitators: Hilma Favors, RN  Department: [facility]  Number of Participants: 7  Group Focus: anxiety Treatment Modality:  Solution-Focused Therapy Interventions utilized were support Purpose: express feelings  Name: Norma Nelson Date of Birth: 1985/09/07  MR: [id number]    Level of Participation: moderate Quality of Participation: cooperative Interactions with others: gave feedback Mood/Affect: appropriate Triggers (if applicable): n/a Cognition: coherent/clear Progress: Significant Response: n/a Plan: referral / recommendations  Patients Problems:  Patient Active Problem List   Diagnosis Date Noted   Substance induced mood disorder (HCC) 02/23/2023   Upper respiratory infection 08/16/2022   History of asthma 08/16/2022   Breast abscess 05/07/2022   Menorrhagia with regular cycle 05/07/2022   Encounter for gynecological examination with Papanicolaou smear of cervix 05/07/2022   Muscle strain 01/04/2021   Establishing care with new doctor, encounter for 12/19/2020   Breast pain, right 12/19/2020   Food allergy 12/19/2020   Kidney stone 12/19/2020   Eczema 12/19/2020   Papanicolaou smear of cervix with positive high risk human papilloma virus (HPV) test 09/09/2016   Leukocytosis 10/19/2015   Prediabetes 10/19/2015   Primary hypertension 09/14/2015   Morbid obesity (HCC) 09/14/2015   Cigarette nicotine dependence without complication 09/14/2015

## 2023-02-25 NOTE — ED Notes (Signed)
Patient awake and alert on unit sitting in dayroom eating breakfast, socializing with peers and watching TV.  No distress or complaint. No withdrawal.  Will monitor.

## 2023-02-25 NOTE — Discharge Planning (Signed)
LCSW spoke with patient on regarding arranging aftercare follow up for IOP services. Patient made aware that [facility] could see her on Friday at 10:30am. Patient reports this time will be good for her as she works at 3:30pm. Intake Appointment has been arranged for Friday February 28, 2023 at 10:30am with Mr. [facility]. Patient advised to arrive 15 minutes prior to appointment for check in. Instructions have been added to the patient's AVS for her review. No other needs to report at this time.   LCSW will continue to follow and provide support to patient while on FBC unit.   Fernande Boyden, LCSW Clinical Social Worker [place] [facility] Ph: [phone]

## 2023-02-25 NOTE — ED Notes (Signed)
Patient was provided breakfast

## 2023-02-25 NOTE — ED Notes (Signed)
Pt. Is sleeping/resting quietly

## 2023-02-26 DIAGNOSIS — F1414 Cocaine abuse with cocaine-induced mood disorder: Secondary | ICD-10-CM | POA: Diagnosis not present

## 2023-02-26 DIAGNOSIS — F411 Generalized anxiety disorder: Secondary | ICD-10-CM | POA: Diagnosis not present

## 2023-02-26 DIAGNOSIS — F17290 Nicotine dependence, other tobacco product, uncomplicated: Secondary | ICD-10-CM | POA: Diagnosis not present

## 2023-02-26 DIAGNOSIS — F329 Major depressive disorder, single episode, unspecified: Secondary | ICD-10-CM | POA: Diagnosis not present

## 2023-02-26 MED ORDER — NICOTINE 21 MG/24HR TD PT24: 21.0000 mg | MEDICATED_PATCH | Freq: Every day | TRANSDERMAL | 0 refills | Status: AC

## 2023-02-26 MED ORDER — MIRTAZAPINE 15 MG PO TABS: 15.0000 mg | ORAL_TABLET | Freq: Every day | ORAL | 0 refills | Status: DC

## 2023-02-26 NOTE — Discharge Planning (Signed)
LCSW was informed by MD that patient is appropriate for discharge on today. Patient aware of intake appt that has been scheduled for her on Friday with [facility]. Patient reports she is contacting family regarding transport time. No other needs to report at this time. LCSW to sign off.   Please inform if further LCSW needs arise prior to discharge.   Fernande Boyden, LCSW Clinical Social Worker [place] [facility] Ph: [phone]
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
## 2023-02-26 NOTE — ED Notes (Signed)
Patient remains asleep in bed at this time without issue or complaint.  Will monitor.

## 2023-02-26 NOTE — ED Notes (Signed)
Patient is sleeping. Respirations equal and unlabored, skin warm and dry. No change in assessment or acuity. Routine safety checks conducted according to facility protocol. Will continue to monitor for safety.   

## 2023-02-26 NOTE — ED Notes (Signed)
Pt. Is sleeping/resting quietly

## 2023-03-05 NOTE — Patient Instructions (Signed)

## 2023-03-05 NOTE — Progress Notes (Unsigned)
   Established Patient Office Visit   Subjective  Patient ID: Norma Nelson, female    DOB: [birth year]  Age: 37 y.o. MRN: [id number]  No chief complaint on file.   She  has a past medical history of Asthma, Bronchitis, COVID-19, Eczema, Gonorrhea, Hypertension, Leukocytosis (2012), Mental disorder, Papanicolaou smear of cervix with positive high risk human papilloma virus (HPV) test (09/09/2016), and Trichomonosis.  HPI  ROS    Objective:     There were no vitals taken for this visit. {Vitals History (Optional):23777}  Physical Exam   No results found for any visits on 03/06/23.  The ASCVD Risk score (Arnett DK, et al., 2019) failed to calculate for the following reasons:   The 2019 ASCVD risk score is only valid for ages 65 to 87    Assessment & Plan:  There are no diagnoses linked to this encounter.  No follow-ups on file.   Cruzita Lederer Newman Nip, FNP

## 2023-03-06 ENCOUNTER — Ambulatory Visit: Payer: MEDICAID | Admitting: Family Medicine

## 2023-03-06 DIAGNOSIS — G4730 Sleep apnea, unspecified: Secondary | ICD-10-CM

## 2023-06-02 ENCOUNTER — Other Ambulatory Visit: Payer: Self-pay | Admitting: Family Medicine

## 2023-06-02 DIAGNOSIS — Z8709 Personal history of other diseases of the respiratory system: Secondary | ICD-10-CM

## 2023-06-24 ENCOUNTER — Telehealth: Payer: Self-pay | Admitting: Family Medicine

## 2023-06-24 NOTE — Telephone Encounter (Signed)
 Copied from CRM [id number]. Topic: Clinical - Medication Refill >> Jun 24, 2023  4:41 PM Marlow Baars wrote: Most Recent Primary Care Visit:  Provider: Gardenia Phlegm  Department: [facility]  Visit Type: OFFICE VISIT  Date: 09/18/2022  Medication: triamcinolone ointment (KENALOG) 0.1 %  Has the patient contacted their pharmacy?    Is this the correct pharmacy for this prescription? Yes If no, delete pharmacy and type the correct one.  This is the patient's preferred pharmacy:  [facility] [address], [place] - [address] [place] [address] [place] [address] Phone: [phone] Fax: [phone]   Has the prescription been filled recently? No  Is the patient out of the medication? Yes  Has the patient been seen for an appointment in the last year OR does the patient have an upcoming appointment? Yes  Can we respond through MyChart? Yes  Please assist patient further as this is for her chronic excema

## 2023-06-24 NOTE — Telephone Encounter (Signed)
 Last Fill: 06/18/22-not on current med list  Last OV: 09/18/22 Next OV: 08/01/23  Routing to provider for review/authorization.   Copied from CRM [id number]. Topic: Clinical - Medication Refill >> Jun 24, 2023  4:41 PM Marlow Baars wrote: Most Recent Primary Care Visit:  Provider: Gardenia Phlegm  Department: [facility]  Visit Type: OFFICE VISIT  Date: 09/18/2022  Medication: triamcinolone ointment (KENALOG) 0.1 %  Has the patient contacted their pharmacy? No   Is this the correct pharmacy for this prescription? Yes If no, delete pharmacy and type the correct one.  This is the patient's preferred pharmacy:  [facility] [address] [place] - [address] [place] [address]  [place] [address] Phone: [phone] Fax: [phone]   Has the prescription been filled recently? No  Is the patient out of the medication? Yes  Has the patient been seen for an appointment in the last year OR does the patient have an upcoming appointment? Yes  Can we respond through MyChart? Yes  Please assist patient further as this is for her chronic excema

## 2023-06-26 ENCOUNTER — Other Ambulatory Visit: Payer: Self-pay | Admitting: Family Medicine

## 2023-06-26 MED ORDER — TRIAMCINOLONE ACETONIDE 0.1 % EX CREA: 1.0000 | TOPICAL_CREAM | Freq: Two times a day (BID) | CUTANEOUS | 0 refills | Status: DC

## 2023-06-26 NOTE — Telephone Encounter (Signed)
 sent

## 2023-06-29 ENCOUNTER — Other Ambulatory Visit: Payer: Self-pay | Admitting: Family Medicine

## 2023-06-30 ENCOUNTER — Encounter: Payer: Self-pay | Admitting: Family Medicine

## 2023-08-01 ENCOUNTER — Encounter: Payer: Self-pay | Admitting: Family Medicine

## 2023-09-03 ENCOUNTER — Other Ambulatory Visit: Payer: Self-pay

## 2023-09-03 ENCOUNTER — Encounter: Payer: Self-pay | Admitting: Family Medicine

## 2023-09-03 DIAGNOSIS — I10 Essential (primary) hypertension: Secondary | ICD-10-CM

## 2023-09-03 MED ORDER — AMLODIPINE BESYLATE 10 MG PO TABS: 10.0000 mg | ORAL_TABLET | Freq: Every day | ORAL | 0 refills | Status: DC

## 2023-09-05 ENCOUNTER — Encounter: Payer: Self-pay | Admitting: Family Medicine

## 2023-09-05 ENCOUNTER — Ambulatory Visit: Payer: MEDICAID | Admitting: Family Medicine

## 2023-09-05 DIAGNOSIS — Z91018 Allergy to other foods: Secondary | ICD-10-CM

## 2023-09-05 DIAGNOSIS — Z8709 Personal history of other diseases of the respiratory system: Secondary | ICD-10-CM

## 2023-09-05 DIAGNOSIS — I10 Essential (primary) hypertension: Secondary | ICD-10-CM | POA: Diagnosis not present

## 2023-09-05 MED ORDER — TRIAMCINOLONE ACETONIDE 0.1 % EX OINT: 1.0000 | TOPICAL_OINTMENT | Freq: Two times a day (BID) | CUTANEOUS | 1 refills | Status: DC

## 2023-09-05 MED ORDER — EPINEPHRINE 0.3 MG/0.3ML IJ SOAJ
0.3000 mg | INTRAMUSCULAR | 1 refills | Status: AC | PRN
Start: 2023-09-05 — End: ?

## 2023-09-05 MED ORDER — AMLODIPINE BESYLATE 10 MG PO TABS: 10.0000 mg | ORAL_TABLET | Freq: Every day | ORAL | 3 refills | Status: DC

## 2023-09-05 MED ORDER — MONTELUKAST SODIUM 10 MG PO TABS: 10.0000 mg | ORAL_TABLET | Freq: Every day | ORAL | 3 refills | Status: DC

## 2023-09-05 MED ORDER — METOPROLOL TARTRATE 25 MG PO TABS: 25.0000 mg | ORAL_TABLET | Freq: Two times a day (BID) | ORAL | 3 refills | Status: DC

## 2023-09-05 MED ORDER — MIRTAZAPINE 15 MG PO TABS: 15.0000 mg | ORAL_TABLET | Freq: Every day | ORAL | 3 refills | Status: AC

## 2023-09-05 NOTE — Progress Notes (Signed)
 Established Patient Office Visit   Subjective  Patient ID: Norma Nelson, female    DOB: 03-16-1986  Age: 38 y.o. MRN: [id number]  Chief Complaint  Patient presents with   Migraine    Was seen in [facility] ED for Migraines. Stated it was due to elevated B/P levels  Would like something for tooth ache Needs Kenalog  ointment prescribed not cream     She  has a past medical history of Asthma, Bronchitis, COVID-19, Eczema, Gonorrhea, Hypertension, Leukocytosis (2012), Mental disorder, Papanicolaou smear of cervix with positive high risk human papilloma virus (HPV) test (09/09/2016), and Trichomonosis.  HPI Patient presents to the clinic for hypertension follow up. For the details of today's visit, please refer to assessment and plan.   Review of Systems  Constitutional:  Negative for chills and fever.  Eyes:  Negative for blurred vision.  Respiratory:  Negative for shortness of breath.   Cardiovascular:  Negative for chest pain.  Gastrointestinal:  Negative for abdominal pain.  Genitourinary:  Negative for dysuria.  Neurological:  Positive for headaches. Negative for dizziness.      Objective:     BP (!) 164/110   Pulse (!) 55   Ht 5\' 11"  (1.803 m)   Wt 270 lb 1.3 oz (122.5 kg)   LMP 09/04/2023 (Exact Date)   SpO2 100%   BMI 37.67 kg/m  BP Readings from Last 3 Encounters:  09/05/23 (!) 164/110  02/22/23 (!) 167/98  10/07/22 (!) 145/88      Physical Exam Vitals reviewed.  Constitutional:      General: She is not in acute distress.    Appearance: Normal appearance. She is not ill-appearing, toxic-appearing or diaphoretic.  HENT:     Head: Normocephalic.  Eyes:     General:        Right eye: No discharge.        Left eye: No discharge.     Conjunctiva/sclera: Conjunctivae normal.  Cardiovascular:     Rate and Rhythm: Normal rate.     Pulses: Normal pulses.     Heart sounds: Normal heart sounds.  Pulmonary:     Effort: Pulmonary effort is normal. No respiratory  distress.     Breath sounds: Normal breath sounds.  Neurological:     Mental Status: She is alert.  Psychiatric:        Mood and Affect: Mood normal.        Behavior: Behavior normal.      No results found for any visits on 09/05/23.  The ASCVD Risk score (Arnett DK, et al., 2019) failed to calculate for the following reasons:   The 2019 ASCVD risk score is only valid for ages 71 to 30    Assessment & Plan:  Primary hypertension Assessment & Plan: Vitals:   09/05/23 0951 09/05/23 1017  BP: (!) 158/108 (!) 164/110   Not controlled, Patient reports has not taken medication in 3 months  Refilled Amlodipine  10 mg  once daily and Metoprolol  25 mg twice daily Advise to follow up in 4 weeks for follow up  It is important to follow a DASH diet which includes vegetables,fruits,whole grains, fat free or low fat diary,fish,poultry,beans,nuts and seeds,vegetable oils. Find an activity that you will enjoy and start to be active at least 5 days a week for 30 minutes each day.   Orders: -     amLODIPine  Besylate; Take 1 tablet (10 mg total) by mouth daily.  Dispense: 30 tablet; Refill: 3 -  Metoprolol  Tartrate; Take 1 tablet (25 mg total) by mouth 2 (two) times daily.  Dispense: 30 tablet; Refill: 3  Food allergy -     EPINEPHrine ; Inject 0.3 mg into the muscle as needed for anaphylaxis. If you use this pen, go to the emergency department immediately.  Dispense: 1 each; Refill: 1  History of asthma -     Montelukast  Sodium; Take 1 tablet (10 mg total) by mouth at bedtime.  Dispense: 30 tablet; Refill: 3  Other orders -     Mirtazapine ; Take 1 tablet (15 mg total) by mouth at bedtime.  Dispense: 30 tablet; Refill: 3 -     Triamcinolone  Acetonide; Apply 1 Application topically 2 (two) times daily.  Dispense: 80 g; Refill: 1    Return in 4 weeks (on 10/03/2023), or if symptoms worsen or fail to improve, for hypertension, re-check blood pressure.   Avelino Lek Amber Bail, FNP

## 2023-09-05 NOTE — Assessment & Plan Note (Signed)
 Vitals:   09/05/23 0951 09/05/23 1017  BP: (!) 158/108 (!) 164/110   Not controlled, Patient reports has not taken medication in 3 months  Refilled Amlodipine  10 mg  once daily and Metoprolol  25 mg twice daily Advise to follow up in 4 weeks for follow up  It is important to follow a DASH diet which includes vegetables,fruits,whole grains, fat free or low fat diary,fish,poultry,beans,nuts and seeds,vegetable oils. Find an activity that you will enjoy and start to be active at least 5 days a week for 30 minutes each day.

## 2023-09-05 NOTE — Patient Instructions (Signed)

## 2023-09-25 ENCOUNTER — Encounter: Payer: Self-pay | Admitting: Family Medicine

## 2023-10-01 ENCOUNTER — Encounter: Payer: Self-pay | Admitting: Family Medicine

## 2023-12-12 ENCOUNTER — Encounter: Payer: Self-pay | Admitting: Radiology

## 2024-02-13 ENCOUNTER — Encounter: Payer: Self-pay | Admitting: Family Medicine

## 2024-02-13 ENCOUNTER — Ambulatory Visit (INDEPENDENT_AMBULATORY_CARE_PROVIDER_SITE_OTHER): Payer: MEDICAID | Admitting: Family Medicine

## 2024-02-13 VITALS — BP 168/104 | HR 76 | Ht 71.0 in | Wt 276.0 lb

## 2024-02-13 DIAGNOSIS — L2089 Other atopic dermatitis: Secondary | ICD-10-CM | POA: Diagnosis not present

## 2024-02-13 DIAGNOSIS — Z8709 Personal history of other diseases of the respiratory system: Secondary | ICD-10-CM

## 2024-02-13 DIAGNOSIS — E782 Mixed hyperlipidemia: Secondary | ICD-10-CM

## 2024-02-13 DIAGNOSIS — E559 Vitamin D deficiency, unspecified: Secondary | ICD-10-CM

## 2024-02-13 DIAGNOSIS — I10 Essential (primary) hypertension: Secondary | ICD-10-CM

## 2024-02-13 DIAGNOSIS — R7303 Prediabetes: Secondary | ICD-10-CM | POA: Diagnosis not present

## 2024-02-13 DIAGNOSIS — L309 Dermatitis, unspecified: Secondary | ICD-10-CM

## 2024-02-13 DIAGNOSIS — E038 Other specified hypothyroidism: Secondary | ICD-10-CM

## 2024-02-13 DIAGNOSIS — R7301 Impaired fasting glucose: Secondary | ICD-10-CM

## 2024-02-13 MED ORDER — TRIAMCINOLONE ACETONIDE 0.1 % EX OINT: 1.0000 | TOPICAL_OINTMENT | Freq: Two times a day (BID) | CUTANEOUS | 1 refills | Status: AC

## 2024-02-13 MED ORDER — METOPROLOL TARTRATE 25 MG PO TABS: 25.0000 mg | ORAL_TABLET | Freq: Two times a day (BID) | ORAL | 3 refills | Status: DC

## 2024-02-13 MED ORDER — MONTELUKAST SODIUM 10 MG PO TABS: 10.0000 mg | ORAL_TABLET | Freq: Every day | ORAL | 3 refills | Status: AC

## 2024-02-13 MED ORDER — ALBUTEROL SULFATE HFA 108 (90 BASE) MCG/ACT IN AERS
2.0000 | INHALATION_SPRAY | Freq: Four times a day (QID) | RESPIRATORY_TRACT | 2 refills | Status: AC | PRN
Start: 2024-02-13 — End: ?

## 2024-02-13 MED ORDER — AMLODIPINE BESYLATE 10 MG PO TABS: 10.0000 mg | ORAL_TABLET | Freq: Every day | ORAL | 3 refills | Status: AC

## 2024-02-13 MED ORDER — BUDESONIDE-FORMOTEROL FUMARATE 80-4.5 MCG/ACT IN AERO: 2.0000 | INHALATION_SPRAY | Freq: Two times a day (BID) | RESPIRATORY_TRACT | 3 refills | Status: DC
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
## 2024-02-13 NOTE — Patient Instructions (Addendum)
 I appreciate the opportunity to provide care to you today!    Follow up:  3 months/ nurse visit in 4 weeks to reassess BP  Labs: please stop by the lab today to get your blood drawn (CBC, CMP, TSH, Lipid profile, HgA1c, Vit D)  Hypertension Management  Your current blood pressure is above the target goal of <140/90 mmHg.  To address this, please continue taking amlodipine  10 mg daily and resume therapy with metoprolol  25 mg BID  Medication Instructions: Take your blood pressure medication at the same time each day. After taking your medication, check your blood pressure at least an hour later. If your first reading is >140/90 mmHg, wait at least 10 minutes and recheck your blood pressure. Side Effects: In the initial days of therapy, you may experience dizziness or lightheadedness as your body adjusts to the lower blood pressure; this is expected. Diet and Lifestyle: Adhere to a low-sodium diet, limiting intake to less than 1500 mg daily, and increase your physical activity. Avoid over-the-counter NSAIDs such as ibuprofen  and naproxen while on this medication. Hydration and Nutrition: Stay well-hydrated by drinking at least 64 ounces of water daily. Increase your servings of fruits and vegetables and avoid excessive sodium in your diet. Long-Term Considerations: Uncontrolled hypertension can increase the risk of cardiovascular diseases, including stroke, coronary artery disease, and heart failure.  Please report to the emergency department if your blood pressure exceeds 180/120 and is accompanied by symptoms such as headaches, chest pain, palpitations, blurred vision, or dizziness.    Please follow up if your symptoms worsen or fail to improve.   Attached with your AVS, you will find valuable resources for self-education. I highly recommend dedicating some time to thoroughly examine them.   Please continue to a heart-healthy diet and increase your physical activities. Try to exercise for  at least five days a week.    It was a pleasure to see you and I look forward to continuing to work together on your health and well-being. Please do not hesitate to call the office if you need care or have questions about your care.  In case of emergency, please visit the Emergency Department for urgent care, or contact our clinic at [phone] to schedule an appointment. We're here to help you!   Have a wonderful day and week. With Gratitude, Meade JENEANE Gerlach MSN, FNP-BC, PMHNP-BC

## 2024-02-14 LAB — CMP14+EGFR
ALT: 12 IU/L (ref 0–32)
AST: 15 IU/L (ref 0–40)
Albumin: 4.1 g/dL (ref 3.9–4.9)
Alkaline Phosphatase: 142 IU/L — ABNORMAL HIGH (ref 41–116)
BUN/Creatinine Ratio: 9 (ref 9–23)
BUN: 7 mg/dL (ref 6–20)
Bilirubin Total: 0.2 mg/dL (ref 0.0–1.2)
CO2: 21 mmol/L (ref 20–29)
Calcium: 10.2 mg/dL (ref 8.7–10.2)
Chloride: 103 mmol/L (ref 96–106)
Creatinine, Ser: 0.75 mg/dL (ref 0.57–1.00)
Globulin, Total: 3.5 g/dL (ref 1.5–4.5)
Glucose: 85 mg/dL (ref 70–99)
Potassium: 3.7 mmol/L (ref 3.5–5.2)
Sodium: 140 mmol/L (ref 134–144)
Total Protein: 7.6 g/dL (ref 6.0–8.5)
eGFR: 106 mL/min/1.73 (ref >59)

## 2024-02-14 LAB — CBC WITH DIFFERENTIAL/PLATELET
Basophils Absolute: 0.1 x10E3/uL (ref 0.0–0.2)
Basos: 1 %
EOS (ABSOLUTE): 0.7 x10E3/uL — ABNORMAL HIGH (ref 0.0–0.4)
Eos: 4 %
Hematocrit: 42.7 % (ref 34.0–46.6)
Hemoglobin: 13.4 g/dL (ref 11.1–15.9)
Immature Grans (Abs): 0 x10E3/uL (ref 0.0–0.1)
Immature Granulocytes: 0 %
Lymphocytes Absolute: 3.5 x10E3/uL — ABNORMAL HIGH (ref 0.7–3.1)
Lymphs: 22 %
MCH: 27.3 pg (ref 26.6–33.0)
MCHC: 31.4 g/dL — ABNORMAL LOW (ref 31.5–35.7)
MCV: 87 fL (ref 79–97)
Monocytes Absolute: 1.6 x10E3/uL — ABNORMAL HIGH (ref 0.1–0.9)
Monocytes: 10 %
Neutrophils Absolute: 9.9 x10E3/uL — ABNORMAL HIGH (ref 1.4–7.0)
Neutrophils: 63 %
Platelets: 434 x10E3/uL (ref 150–450)
RBC: 4.91 x10E6/uL (ref 3.77–5.28)
RDW: 14.4 % (ref 11.7–15.4)
WBC: 15.9 x10E3/uL — ABNORMAL HIGH (ref 3.4–10.8)

## 2024-02-14 LAB — LIPID PANEL
Chol/HDL Ratio: 3.7 ratio (ref 0.0–4.4)
Cholesterol, Total: 162 mg/dL (ref 100–199)
HDL: 44 mg/dL (ref >39)
LDL Chol Calc (NIH): 100 mg/dL — ABNORMAL HIGH (ref 0–99)
Triglycerides: 96 mg/dL (ref 0–149)
VLDL Cholesterol Cal: 18 mg/dL (ref 5–40)

## 2024-02-14 LAB — HEMOGLOBIN A1C
Est. average glucose Bld gHb Est-mCnc: 120 mg/dL
Hgb A1c MFr Bld: 5.8 % — ABNORMAL HIGH (ref 4.8–5.6)

## 2024-02-14 LAB — TSH+FREE T4
Free T4: 1.08 ng/dL (ref 0.82–1.77)
TSH: 0.817 u[IU]/mL (ref 0.450–4.500)

## 2024-02-14 LAB — VITAMIN D 25 HYDROXY (VIT D DEFICIENCY, FRACTURES): Vit D, 25-Hydroxy: 10.9 ng/mL — ABNORMAL LOW (ref 30.0–100.0)

## 2024-02-15 NOTE — Assessment & Plan Note (Signed)
 The patient reports a flare-up of eczema on both arms and legs and is requesting medication refills today. Refills have been sent to the pharmacy.

## 2024-02-15 NOTE — Assessment & Plan Note (Signed)
 Lifestyle modifications for prediabetes were discussed, including adopting a heart-healthy diet and increasing physical activity. The patient was encouraged to decrease her intake of high-sugar foods and beverages. She verbalized understanding and is aware of the plan of care.

## 2024-02-15 NOTE — Assessment & Plan Note (Signed)
 Uncontrolled blood pressure in the clinic today. The patient reports taking only amlodipine  5 mg daily, as metoprolol  was discontinued by his PCP. He states that his blood pressure was well controlled when taking both amlodipine  and metoprolol . Will reinstate metoprolol  therapy today and schedule a follow-up nurse visit in four weeks to reassess blood pressure. A low-sodium diet and increased physical activity were encouraged. The patient was also encouraged to monitor and record ambulatory blood pressure readings for review at the next visit.

## 2024-02-23 ENCOUNTER — Encounter: Payer: Self-pay | Admitting: Radiology

## 2024-03-12 ENCOUNTER — Ambulatory Visit: Payer: MEDICAID

## 2024-03-14 ENCOUNTER — Ambulatory Visit: Payer: Self-pay | Admitting: Family Medicine

## 2024-03-14 DIAGNOSIS — E559 Vitamin D deficiency, unspecified: Secondary | ICD-10-CM

## 2024-03-14 MED ORDER — VITAMIN D (ERGOCALCIFEROL) 1.25 MG (50000 UNIT) PO CAPS: 50000.0000 [IU] | ORAL_CAPSULE | ORAL | 1 refills | Status: AC

## 2024-05-03 ENCOUNTER — Encounter: Payer: Self-pay | Admitting: Internal Medicine

## 2024-05-03 ENCOUNTER — Ambulatory Visit (INDEPENDENT_AMBULATORY_CARE_PROVIDER_SITE_OTHER): Payer: MEDICAID | Admitting: Internal Medicine

## 2024-05-03 VITALS — BP 146/88 | HR 104 | Ht 71.0 in | Wt 280.4 lb

## 2024-05-03 DIAGNOSIS — J0110 Acute frontal sinusitis, unspecified: Secondary | ICD-10-CM | POA: Diagnosis not present

## 2024-05-03 DIAGNOSIS — J4541 Moderate persistent asthma with (acute) exacerbation: Secondary | ICD-10-CM | POA: Diagnosis not present

## 2024-05-03 DIAGNOSIS — I10 Essential (primary) hypertension: Secondary | ICD-10-CM

## 2024-05-03 DIAGNOSIS — Z09 Encounter for follow-up examination after completed treatment for conditions other than malignant neoplasm: Secondary | ICD-10-CM | POA: Insufficient documentation

## 2024-05-03 MED ORDER — METOPROLOL TARTRATE 25 MG PO TABS: 25.0000 mg | ORAL_TABLET | Freq: Two times a day (BID) | ORAL | 1 refills | Status: AC

## 2024-05-03 MED ORDER — AZITHROMYCIN 250 MG PO TABS: ORAL_TABLET | ORAL | 0 refills | Status: AC

## 2024-05-03 MED ORDER — FLUTICASONE-SALMETEROL 115-21 MCG/ACT IN AERO: 2.0000 | INHALATION_SPRAY | Freq: Two times a day (BID) | RESPIRATORY_TRACT | 12 refills | Status: DC

## 2024-05-03 MED ORDER — ADVAIR HFA 115-21 MCG/ACT IN AERO: 2.0000 | INHALATION_SPRAY | Freq: Two times a day (BID) | RESPIRATORY_TRACT | 12 refills | Status: AC

## 2024-05-03 MED ORDER — GUAIFENESIN-CODEINE 100-10 MG/5ML PO SOLN: 5.0000 mL | Freq: Three times a day (TID) | ORAL | 0 refills | Status: AC | PRN

## 2024-05-03 MED ORDER — PREDNISONE 10 MG (21) PO TBPK: ORAL_TABLET | ORAL | 0 refills | Status: AC

## 2024-05-03 MED ORDER — METHYLPREDNISOLONE SODIUM SUCC 125 MG IJ SOLR
125.0000 mg | Freq: Once | INTRAMUSCULAR | Status: AC
  Administered 2024-05-03: 125 mg via INTRAMUSCULAR

## 2024-05-03 NOTE — Assessment & Plan Note (Signed)
 Recurrent ER visits for asthma exacerbations Needs maintenance inhaler-started Advair  Has tried Symbicort  in the past without much relief Started empiric azithromycin  due to recurrent symptoms and acute sinusitis Solu-Medrol  IM today, followed by Sterapred taper Cheratussin as needed for cough Referred to allergy and immunology clinic
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
## 2024-05-03 NOTE — Assessment & Plan Note (Signed)
 BP Readings from Last 1 Encounters:  05/03/24 (!) 146/88   Uncontrolled with amlodipine  10 mg QD Refilled metoprolol  25 mg BID Counseled for compliance with the medications Advised DASH diet and moderate exercise/walking, at least 150 mins/week

## 2024-05-03 NOTE — Assessment & Plan Note (Signed)
 Started empiric azithromycin  as she has persistent symptoms despite symptomatic treatment Nasal saline spray as needed for nasal congestion Can use vaporizer and/or sinus inhaler as needed for nasal congestion

## 2024-05-03 NOTE — Patient Instructions (Signed)
 Please start taking Azithromycin  and Prednisone  as prescribed.  Please use Advair  regularly and use Albuterol  as needed for shortness of breath or wheezing.

## 2024-05-03 NOTE — Assessment & Plan Note (Signed)
 ER chart reviewed, including imaging Had CTA chest, which was negative for PE, but showed signs of bronchiolitis

## 2024-05-06 ENCOUNTER — Telehealth: Payer: Self-pay | Admitting: Pharmacy Technician

## 2024-05-06 ENCOUNTER — Other Ambulatory Visit (HOSPITAL_COMMUNITY): Payer: Self-pay

## 2024-05-06 NOTE — Telephone Encounter (Signed)
 Pharmacy Patient Advocate Encounter   Received notification from Onbase CMM KEY that prior authorization for Fluticasone -Salmeterol 115-21MCG/ACT is required/requested.   Insurance verification completed.   The patient is insured through [employer].   Per test claim: Refill too soon. PA is not needed at this time. Medication was filled 05/05/24. Next eligible fill date is 05/28/24. Likely a pharmacy error b/f being able to fill it correctly. Archived Key: [id number]

## 2024-06-23 ENCOUNTER — Ambulatory Visit: Payer: MEDICAID | Admitting: Allergy & Immunology
